# Patient Record
Sex: Female | Born: 1945
Health system: Southern US, Community
[De-identification: ages and names within clinical notes are randomized; demographics above are authoritative.]

## PROBLEM LIST (undated history)

## (undated) DIAGNOSIS — G2581 Restless legs syndrome: Secondary | ICD-10-CM

## (undated) DIAGNOSIS — E785 Hyperlipidemia, unspecified: Secondary | ICD-10-CM

## (undated) DIAGNOSIS — E559 Vitamin D deficiency, unspecified: Secondary | ICD-10-CM

## (undated) DIAGNOSIS — M797 Fibromyalgia: Secondary | ICD-10-CM

## (undated) DIAGNOSIS — F329 Major depressive disorder, single episode, unspecified: Secondary | ICD-10-CM

## (undated) DIAGNOSIS — T8859XA Other complications of anesthesia, initial encounter: Secondary | ICD-10-CM

## (undated) DIAGNOSIS — G43909 Migraine, unspecified, not intractable, without status migrainosus: Secondary | ICD-10-CM

## (undated) DIAGNOSIS — C50919 Malignant neoplasm of unspecified site of unspecified female breast: Secondary | ICD-10-CM

## (undated) HISTORY — DX: Hyperlipidemia, unspecified: E78.5

## (undated) HISTORY — DX: Vitamin D deficiency, unspecified: E55.9

## (undated) HISTORY — PX: REDUCTION MAMMAPLASTY: SUR839

## (undated) HISTORY — DX: Restless legs syndrome: G25.81

## (undated) HISTORY — DX: Major depressive disorder, single episode, unspecified: F32.9

## (undated) HISTORY — DX: Fibromyalgia: M79.7

## (undated) HISTORY — DX: Malignant neoplasm of unspecified site of unspecified female breast: C50.919

---

## 1984-07-07 HISTORY — PX: ABDOMINAL HYSTERECTOMY: SHX81

## 2005-07-07 DIAGNOSIS — R569 Unspecified convulsions: Secondary | ICD-10-CM

## 2005-07-07 HISTORY — DX: Unspecified convulsions: R56.9

## 2007-07-08 HISTORY — PX: EYE SURGERY: SHX253

## 2012-04-21 ENCOUNTER — Ambulatory Visit: Payer: Self-pay | Admitting: Internal Medicine

## 2012-05-05 ENCOUNTER — Ambulatory Visit: Payer: Self-pay | Admitting: Internal Medicine

## 2012-06-01 ENCOUNTER — Ambulatory Visit: Payer: Self-pay | Admitting: Surgery

## 2012-06-01 HISTORY — PX: BREAST BIOPSY: SHX20

## 2012-07-07 HISTORY — PX: THUMB ARTHROSCOPY: SHX2509

## 2012-07-07 HISTORY — PX: CARPAL TUNNEL RELEASE: SHX101

## 2012-07-29 ENCOUNTER — Ambulatory Visit: Payer: Self-pay | Admitting: Specialist

## 2012-07-29 LAB — BASIC METABOLIC PANEL
Anion Gap: 8 (ref 7–16)
BUN: 16 mg/dL (ref 7–18)
Co2: 28 mmol/L (ref 21–32)
Creatinine: 0.93 mg/dL (ref 0.60–1.30)
EGFR (African American): >60
Osmolality: 280 (ref 275–301)
Potassium: 4.1 mmol/L (ref 3.5–5.1)
Sodium: 140 mmol/L (ref 136–145)

## 2012-07-29 LAB — CBC WITH DIFFERENTIAL/PLATELET
Basophil #: 0.1 10*3/uL (ref 0.0–0.1)
Eosinophil #: 0.4 10*3/uL (ref 0.0–0.7)
Eosinophil %: 4.9 %
HGB: 11.6 g/dL — ABNORMAL LOW (ref 12.0–16.0)
Lymphocyte %: 23.8 %
MCH: 28.7 pg (ref 26.0–34.0)
Monocyte #: 0.5 x10 3/mm (ref 0.2–0.9)
Monocyte %: 6.3 %
Neutrophil #: 4.6 10*3/uL (ref 1.4–6.5)
Neutrophil %: 63.8 %
RBC: 4.04 10*6/uL (ref 3.80–5.20)
RDW: 14.8 % — ABNORMAL HIGH (ref 11.5–14.5)

## 2012-08-06 ENCOUNTER — Ambulatory Visit: Payer: Self-pay | Admitting: Specialist

## 2012-11-25 LAB — LIPID PANEL
CHOLESTEROL: 256 mg/dL — AB (ref 0–200)
HDL: 99 mg/dL — AB (ref 35–70)
LDL Cholesterol: 117 mg/dL
TRIGLYCERIDES: 200 mg/dL — AB (ref 40–160)

## 2013-05-26 ENCOUNTER — Ambulatory Visit: Payer: Self-pay | Admitting: Internal Medicine

## 2013-12-02 ENCOUNTER — Ambulatory Visit: Payer: Self-pay | Admitting: Internal Medicine

## 2013-12-02 LAB — HM MAMMOGRAPHY: HM MAMMO: NORMAL

## 2014-07-07 DIAGNOSIS — C50919 Malignant neoplasm of unspecified site of unspecified female breast: Secondary | ICD-10-CM

## 2014-07-07 HISTORY — PX: MASTECTOMY, RADICAL: SHX710

## 2014-07-07 HISTORY — DX: Malignant neoplasm of unspecified site of unspecified female breast: C50.919

## 2014-10-11 ENCOUNTER — Encounter: Payer: Self-pay | Admitting: Internal Medicine

## 2014-10-11 DIAGNOSIS — G43009 Migraine without aura, not intractable, without status migrainosus: Secondary | ICD-10-CM | POA: Insufficient documentation

## 2014-10-11 DIAGNOSIS — M722 Plantar fascial fibromatosis: Secondary | ICD-10-CM | POA: Insufficient documentation

## 2014-10-11 DIAGNOSIS — E559 Vitamin D deficiency, unspecified: Secondary | ICD-10-CM | POA: Insufficient documentation

## 2014-10-11 DIAGNOSIS — R600 Localized edema: Secondary | ICD-10-CM | POA: Insufficient documentation

## 2014-10-11 DIAGNOSIS — F419 Anxiety disorder, unspecified: Secondary | ICD-10-CM | POA: Insufficient documentation

## 2014-10-11 DIAGNOSIS — L409 Psoriasis, unspecified: Secondary | ICD-10-CM | POA: Insufficient documentation

## 2014-10-11 DIAGNOSIS — G2581 Restless legs syndrome: Secondary | ICD-10-CM | POA: Insufficient documentation

## 2014-10-11 DIAGNOSIS — F5101 Primary insomnia: Secondary | ICD-10-CM | POA: Insufficient documentation

## 2014-10-11 DIAGNOSIS — F3181 Bipolar II disorder: Secondary | ICD-10-CM | POA: Insufficient documentation

## 2014-10-11 DIAGNOSIS — F324 Major depressive disorder, single episode, in partial remission: Secondary | ICD-10-CM | POA: Insufficient documentation

## 2014-10-11 DIAGNOSIS — N6019 Diffuse cystic mastopathy of unspecified breast: Secondary | ICD-10-CM | POA: Insufficient documentation

## 2014-10-11 DIAGNOSIS — N951 Menopausal and female climacteric states: Secondary | ICD-10-CM | POA: Insufficient documentation

## 2014-10-11 LAB — HM COLONOSCOPY

## 2014-10-20 ENCOUNTER — Other Ambulatory Visit: Payer: Self-pay | Admitting: Internal Medicine

## 2014-10-20 DIAGNOSIS — Z1231 Encounter for screening mammogram for malignant neoplasm of breast: Secondary | ICD-10-CM

## 2014-12-05 ENCOUNTER — Ambulatory Visit
Admission: RE | Admit: 2014-12-05 | Discharge: 2014-12-05 | Disposition: A | Discharge status: Home / Self Care | Payer: Medicare PPO | Source: Ambulatory Visit | Attending: Internal Medicine | Admitting: Internal Medicine

## 2014-12-05 DIAGNOSIS — R921 Mammographic calcification found on diagnostic imaging of breast: Secondary | ICD-10-CM | POA: Insufficient documentation

## 2014-12-05 DIAGNOSIS — R922 Inconclusive mammogram: Secondary | ICD-10-CM | POA: Insufficient documentation

## 2014-12-05 DIAGNOSIS — Z1231 Encounter for screening mammogram for malignant neoplasm of breast: Secondary | ICD-10-CM | POA: Diagnosis not present

## 2014-12-06 ENCOUNTER — Other Ambulatory Visit: Payer: Self-pay | Admitting: Internal Medicine

## 2014-12-06 ENCOUNTER — Ambulatory Visit (INDEPENDENT_AMBULATORY_CARE_PROVIDER_SITE_OTHER): Payer: Medicare PPO | Admitting: Internal Medicine

## 2014-12-06 ENCOUNTER — Encounter: Payer: Self-pay | Admitting: Internal Medicine

## 2014-12-06 VITALS — BP 118/60 | HR 60 | Ht 62.0 in | Wt 171.0 lb

## 2014-12-06 DIAGNOSIS — N289 Disorder of kidney and ureter, unspecified: Secondary | ICD-10-CM

## 2014-12-06 DIAGNOSIS — R928 Other abnormal and inconclusive findings on diagnostic imaging of breast: Secondary | ICD-10-CM

## 2014-12-06 DIAGNOSIS — G43009 Migraine without aura, not intractable, without status migrainosus: Secondary | ICD-10-CM | POA: Diagnosis not present

## 2014-12-06 DIAGNOSIS — M797 Fibromyalgia: Secondary | ICD-10-CM | POA: Diagnosis not present

## 2014-12-06 DIAGNOSIS — F5101 Primary insomnia: Secondary | ICD-10-CM | POA: Diagnosis not present

## 2014-12-06 DIAGNOSIS — Z1211 Encounter for screening for malignant neoplasm of colon: Secondary | ICD-10-CM | POA: Diagnosis not present

## 2014-12-06 DIAGNOSIS — N6019 Diffuse cystic mastopathy of unspecified breast: Secondary | ICD-10-CM | POA: Diagnosis not present

## 2014-12-06 NOTE — Progress Notes (Signed)
Date:  12/06/2014   Name:  Sandra Mckinney   DOB:  1946-02-24   MRN:  [id number]  PCP:  Halina Maidens, MD    Chief Complaint: Depression and Migraine   History of Present Illness:  This is a 69 y.o. female  who is presenting with migraine headaches.  They are stable - often related to sinus or allergies.  In the past had one or two per week, now only about once a month. Fibromyalgia is also stable.  Still able to babysit 2 small grandchildren on a daily basis.  Using hydrocodone three times per day and not needing to take more.  She does not need to take it for sleep.  Mostly during the day with back pain. She also has history of fibrocystic disease.  Her mammogram yesterday showed left calcifications that need additional views. She is also no longer taking ambien due to short term memory issues.  Not sleeping as well - gets 5 hours per night.  She sometimes takes a half [place] if she needs to. At last visit in September, her creatnine was 1.8 with GFR 48.  She was told to have a recheck. Review of Systems:  Review of Systems  Constitutional: Positive for fatigue. Negative for activity change.  Respiratory: Negative for cough and shortness of breath.   Cardiovascular: Negative for leg swelling.  Musculoskeletal: Positive for myalgias, back pain and arthralgias. Negative for joint swelling and gait problem.  Neurological: Positive for headaches. Negative for syncope, speech difficulty and numbness.  Psychiatric/Behavioral: Positive for sleep disturbance. Negative for decreased concentration. The patient is not nervous/anxious.     Patient Active Problem List   Diagnosis Date Noted  . Anxiety disorder 10/11/2014  . Bloodgood disease 10/11/2014  . Plantar fasciitis 10/11/2014  . Fibrositis 10/11/2014  . Local edema 10/11/2014  . Depression, major, single episode, in partial remission 10/11/2014  . Hot flash, menopausal 10/11/2014  . Migraine without aura and responsive to  treatment 10/11/2014  . Idiopathic insomnia 10/11/2014  . Psoriasis 10/11/2014  . Restless leg 10/11/2014  . Avitaminosis D 10/11/2014    Prior to Admission medications   Medication Sig Start Date End Date Taking? Authorizing Provider  aspirin-acetaminophen-caffeine (EXCEDRIN MIGRAINE) 6045891421 MG per tablet Take 1 tablet by mouth as needed.   Yes Historical Provider, MD  betamethasone dipropionate (DIPROLENE) 0.05 % cream Apply 1 application topically 2 (two) times daily.   Yes Historical Provider, MD  busPIRone (BUSPAR) 10 MG tablet Take 1 tablet by mouth 4 (four) times daily. 03/29/14  Yes Historical Provider, MD  Calcium-Magnesium-Vitamin D 600-40-500 MG-MG-UNIT TB24 Take 1 tablet by mouth daily.   Yes Historical Provider, MD  cholecalciferol (VITAMIN D) 1000 UNITS tablet Take 1 tablet by mouth daily.   Yes Historical Provider, MD  estrogens, conjugated, (PREMARIN) 0.625 MG tablet Take 1 tablet by mouth daily. 10/10/14  Yes Historical Provider, MD  furosemide (LASIX) 20 MG tablet Take 1 tablet by mouth daily. 10/10/14  Yes Historical Provider, MD  HYDROcodone-acetaminophen (NORCO/VICODIN) 5-325 MG per tablet Take 1 tablet by mouth 3 (three) times daily. 10/02/14  Yes Historical Provider, MD  magnesium oxide (MAG-OX) 400 MG tablet Take 1 tablet by mouth daily.   Yes Historical Provider, MD  metaxalone (SKELAXIN) 800 MG tablet Take 1 tablet by mouth every 8 (eight) hours. 11/11/13  Yes Historical Provider, MD  MULTIPLE VITAMINS-MINERALS PO Take 1 tablet by mouth daily.   Yes Historical Provider, MD  Omega 3 1200 MG CAPS Take 1  capsule by mouth daily.   Yes Historical Provider, MD  propranolol ER (INDERAL LA) 60 MG 24 hr capsule Take 1 capsule by mouth daily. 08/31/14  Yes Historical Provider, MD  rOPINIRole (REQUIP) 0.5 MG tablet Take 5 tablets by mouth at bedtime. 06/06/14  Yes Historical Provider, MD    Allergies  Allergen Reactions  . Cephalosporins   . Duloxetine Hcl   . Lyrica  [Pregabalin]   . Oxycodone   . Penicillins   . Prilosec  [Omeprazole]   . Sulfa Antibiotics     Past Surgical History  Procedure Laterality Date  . Carpal tunnel release Right 2014  . Breast surgery Bilateral breast reduction    2000  . Abdominal hysterectomy  1986    uterine fibroids and endometriosis  . Eye surgery Bilateral 2009    catarcts  . Thumb arthroscopy Right 2014  . Breast biopsy Right 06/01/12    [place] bx/clip-neg  . Reduction mammaplasty      2001    History  Substance Use Topics  . Smoking status: Never Smoker   . Smokeless tobacco: Not on file  . Alcohol Use: No    Family History  Problem Relation Age of Onset  . Dementia Mother   . Hypertension Mother   . Heart disease Father 24    Medication list has been reviewed and updated.  Physical Examination:  Physical Exam  Constitutional: She is oriented to person, place, and time. She appears well-developed and well-nourished.  HENT:  Head: Normocephalic.  Neck: No tracheal deviation present. No thyromegaly present.  Cardiovascular: Normal rate, regular rhythm and normal heart sounds.   Pulmonary/Chest: No respiratory distress. She has no wheezes. She has no rales.  Musculoskeletal: Normal range of motion. She exhibits no edema.  Lymphadenopathy:    She has no cervical adenopathy.  Neurological: She is alert and oriented to person, place, and time.  Skin: Skin is warm.  Psychiatric: She has a normal mood and affect. Her behavior is normal.    BP 118/60 mmHg  Pulse 60  Ht 5\' 2"  (1.575 m)  Wt 171 lb (77.565 kg)  BMI 31.27 kg/m2  Assessment and Plan: 1. Migraine without aura and responsive to treatment Doing well.  Controlled - continue current therapy.  2. Fibrositis Stable.  3. Bloodgood disease, unspecified laterality Will need repeat views due to recent left sided calcifications.  4. Idiopathic insomnia May continue [place] as needed.  She still some left over and will use 1/2 tab  intermittently.  5. Colon cancer screening Unable to provide stool cards today.  Will send next visit.  6. Renal insufficiency Last GFR 48.  Continue avoid nsaids. - Basic Metabolic Panel (BMET)   Halina Maidens, MD [facility]  12/06/2014

## 2014-12-07 LAB — BASIC METABOLIC PANEL
BUN / CREAT RATIO: 15 (ref 11–26)
BUN: 15 mg/dL (ref 8–27)
CHLORIDE: 98 mmol/L (ref 97–108)
CO2: 27 mmol/L (ref 18–29)
Calcium: 9.4 mg/dL (ref 8.7–10.3)
Creatinine, Ser: 1 mg/dL (ref 0.57–1.00)
GFR calc Af Amer: 68 mL/min/{1.73_m2} (ref >59)
GFR, EST NON AFRICAN AMERICAN: 59 mL/min/{1.73_m2} — AB (ref >59)
Glucose: 88 mg/dL (ref 65–99)
POTASSIUM: 4.6 mmol/L (ref 3.5–5.2)
SODIUM: 139 mmol/L (ref 134–144)

## 2014-12-11 ENCOUNTER — Ambulatory Visit

## 2014-12-11 ENCOUNTER — Ambulatory Visit
Admission: RE | Admit: 2014-12-11 | Discharge: 2014-12-11 | Disposition: A | Discharge status: Home / Self Care | Payer: Medicare PPO | Source: Ambulatory Visit | Attending: Internal Medicine | Admitting: Internal Medicine

## 2014-12-11 ENCOUNTER — Other Ambulatory Visit: Payer: Self-pay | Admitting: Internal Medicine

## 2014-12-11 DIAGNOSIS — R922 Inconclusive mammogram: Secondary | ICD-10-CM | POA: Diagnosis not present

## 2014-12-11 DIAGNOSIS — R920 Mammographic microcalcification found on diagnostic imaging of breast: Secondary | ICD-10-CM | POA: Diagnosis not present

## 2014-12-11 DIAGNOSIS — R928 Other abnormal and inconclusive findings on diagnostic imaging of breast: Secondary | ICD-10-CM

## 2014-12-12 ENCOUNTER — Other Ambulatory Visit: Payer: Self-pay | Admitting: Internal Medicine

## 2014-12-12 DIAGNOSIS — R928 Other abnormal and inconclusive findings on diagnostic imaging of breast: Secondary | ICD-10-CM

## 2014-12-12 DIAGNOSIS — R921 Mammographic calcification found on diagnostic imaging of breast: Secondary | ICD-10-CM

## 2014-12-13 DIAGNOSIS — D485 Neoplasm of uncertain behavior of skin: Secondary | ICD-10-CM | POA: Diagnosis not present

## 2014-12-21 ENCOUNTER — Ambulatory Visit
Admission: RE | Admit: 2014-12-21 | Discharge: 2014-12-21 | Disposition: A | Discharge status: Home / Self Care | Payer: Medicare PPO | Source: Ambulatory Visit | Attending: Internal Medicine | Admitting: Internal Medicine

## 2014-12-21 ENCOUNTER — Other Ambulatory Visit: Payer: Self-pay | Admitting: Internal Medicine

## 2014-12-21 DIAGNOSIS — D0512 Intraductal carcinoma in situ of left breast: Secondary | ICD-10-CM | POA: Insufficient documentation

## 2014-12-21 DIAGNOSIS — R921 Mammographic calcification found on diagnostic imaging of breast: Secondary | ICD-10-CM

## 2014-12-21 DIAGNOSIS — R928 Other abnormal and inconclusive findings on diagnostic imaging of breast: Secondary | ICD-10-CM

## 2014-12-21 DIAGNOSIS — C50912 Malignant neoplasm of unspecified site of left female breast: Secondary | ICD-10-CM

## 2014-12-25 ENCOUNTER — Other Ambulatory Visit: Payer: Self-pay

## 2014-12-25 ENCOUNTER — Telehealth: Payer: Self-pay | Admitting: Internal Medicine

## 2014-12-25 DIAGNOSIS — D0512 Intraductal carcinoma in situ of left breast: Secondary | ICD-10-CM

## 2014-12-25 LAB — SURGICAL PATHOLOGY

## 2014-12-25 MED ORDER — HYDROCODONE-ACETAMINOPHEN 5-325 MG PO TABS: 1.0000 | ORAL_TABLET | Freq: Three times a day (TID) | ORAL | Status: DC

## 2014-12-25 NOTE — Telephone Encounter (Signed)
Findings discussed with patient by phone.  She will await the call for her appointment with Dr. Marina Gravel.

## 2014-12-26 ENCOUNTER — Ambulatory Visit: Payer: Self-pay | Admitting: Surgery

## 2014-12-26 ENCOUNTER — Encounter: Payer: Self-pay | Admitting: *Deleted

## 2014-12-26 ENCOUNTER — Telehealth: Payer: Self-pay

## 2014-12-26 NOTE — Progress Notes (Signed)
Oncology Nurse Navigator Documentation  Oncology Nurse Navigator Flowsheets 12/26/2014  Referral date to RadOnc/MedOnc 12/26/2014  Navigator Encounter Type Introductory phone call  Patient Visit Type Initial  Barriers/Navigation Needs No barriers at this time  Time Spent with Patient 30   The patient called the breast center today with complaints of left breast pain and swelling.  She was transferred to me since she received the results of her biopsy from 12/21/14.  Patient states her left breast is swollen and very painful to touch.  Rates the pain an 8 on a 0/10 scale.  States I also take Oxycodone all the time.  Informed patient that I would assist in getting her seen by Dr. Marina Gravel as soon as possible since he is who her primary care provider, Dr. Army Melia is referring her to for her new diagnosis of breast cancer.  She is agreeable.  Appointment scheduled to see him tomorrow at 9:30. While notifying the patient of her appointment time, she said she was seeing Dr. Marina Gravel today at 1:45.  Reviewed navigation services.  Will leave patient her breast cancer educational literature, "My Breast Cancer Treatment Handbook" by Josephine Igo, RN at Dr. Juliette Mangle office today for her to pick up.

## 2014-12-26 NOTE — Telephone Encounter (Signed)
Spoke with Sheena at this time. She explains that patient has had positive Left Breast Biopsy for DCIS on 6/16. Pt placed on schedule to been by Dr. Marina Gravel tomorrow am at 0930.

## 2014-12-27 ENCOUNTER — Ambulatory Visit (INDEPENDENT_AMBULATORY_CARE_PROVIDER_SITE_OTHER): Payer: Medicare PPO | Admitting: Surgery

## 2014-12-27 ENCOUNTER — Encounter: Payer: Self-pay | Admitting: Surgery

## 2014-12-27 ENCOUNTER — Telehealth: Payer: Self-pay

## 2014-12-27 ENCOUNTER — Telehealth: Payer: Self-pay | Admitting: *Deleted

## 2014-12-27 VITALS — BP 132/77 | HR 56 | Temp 97.7°F | Ht 62.0 in | Wt 168.6 lb

## 2014-12-27 DIAGNOSIS — D0511 Intraductal carcinoma in situ of right breast: Secondary | ICD-10-CM | POA: Insufficient documentation

## 2014-12-27 DIAGNOSIS — D0512 Intraductal carcinoma in situ of left breast: Secondary | ICD-10-CM | POA: Diagnosis not present

## 2014-12-27 NOTE — Telephone Encounter (Signed)
Spoke with Nurse Navigator at this time. She has scheduled patient to be seen Monday with Dr. Mike Gip at 3:30pm.  She will notify patient.  I will place referral.

## 2014-12-27 NOTE — Telephone Encounter (Signed)
Received message from [employer] at Dr. Algernon Huxley office to schedule patient to see Dr. Mike Gip prior to surgical decisions.  Called patient to give her an appointment date.  Patient and her husband were very upset and refused appointment to see a medical oncologist.  States she wants a mastectomy, no radiation, and feels like her opinion was not addressed.  States she is going to get an appointment at [facility] where her son works.  States she was disappointed in her care.  Number given to the office of patient experience.  Offered support.  Gave her my number to call if I could assist them in any way.

## 2014-12-27 NOTE — Progress Notes (Signed)
Patient ID: Sandra Mckinney, female   DOB: [birth year], 69 y.o.   MRN: [id number]  Chief Complaint  Patient presents with  . Breast Cancer    Left Breast - Positive DCIS (6/16)- [place]    HPI    Sandra Mckinney is a 69 y.o. female.  Referred by her primary care physician Dr. Army Melia with a newly diagnosed left breast ductal carcinoma in situ. Mammography review demonstrates the tumor to be at least 1.2 centimeter in size. Coarse calcifications were seen which prompted a stereotactic core biopsy performed on June 16. Of note the patient is had a prior abdominal hysterectomy with bilateral breast reconstruction and reduction 2000. She had a right breast cyst aspirated approximately 3 years ago which was benign in nature.  The patient and her husband had many questions. They're also interested in a second opinion.  2 days ago while lifting her grandchild she had sudden onset of left breast pain which has persisted.  Past Medical History  Diagnosis Date  . Breast cancer     Past Surgical History  Procedure Laterality Date  . Carpal tunnel release Right 2014  . Breast surgery Bilateral breast reduction    2000  . Abdominal hysterectomy  1986    uterine fibroids and endometriosis  . Eye surgery Bilateral 2009    catarcts  . Thumb arthroscopy Right 2014  . Breast biopsy Right 06/01/12    [place] bx/clip-neg  . Reduction mammaplasty      2001    Family History  Problem Relation Age of Onset  . Dementia Mother   . Hypertension Mother   . Heart disease Father 45    Social History History  Substance Use Topics  . Smoking status: Never Smoker   . Smokeless tobacco: Never Used  . Alcohol Use: No    Allergies  Allergen Reactions  . Cephalosporins   . Duloxetine Hcl   . Lyrica [Pregabalin]   . Oxycodone   . Penicillins   . Prilosec  [Omeprazole]   . Sulfa Antibiotics     Current Outpatient Prescriptions  Medication Sig Dispense Refill  . betamethasone  dipropionate (DIPROLENE) 0.05 % cream Apply 1 application topically 2 (two) times daily.    . busPIRone (BUSPAR) 10 MG tablet Take 1 tablet by mouth 4 (four) times daily.    . Calcium-Magnesium-Vitamin D 892-11-941 MG-MG-UNIT TB24 Take 1 tablet by mouth daily.    . cholecalciferol (VITAMIN D) 1000 UNITS tablet Take 1 tablet by mouth daily.    Marland Kitchen estrogens, conjugated, (PREMARIN) 0.625 MG tablet Take 1 tablet by mouth daily.    . furosemide (LASIX) 20 MG tablet Take 1 tablet by mouth daily.    Marland Kitchen HYDROcodone-acetaminophen (NORCO/VICODIN) 5-325 MG per tablet Take 1 tablet by mouth 3 (three) times daily. 90 tablet 0  . metaxalone (SKELAXIN) 800 MG tablet Take 1 tablet by mouth every 8 (eight) hours.    . MULTIPLE VITAMINS-MINERALS PO Take 1 tablet by mouth daily.    . propranolol ER (INDERAL LA) 60 MG 24 hr capsule Take 1 capsule by mouth daily.    Marland Kitchen zolpidem (AMBIEN) 5 MG tablet Take 5 mg by mouth at bedtime as needed for sleep.     No current facility-administered medications for this visit.      Review of Systems A 10 point review of systems was asked and was negative except for the following positive findings  out left breast pain. This started 2 days ago. There has been no nipple  drainage no fever. No weight loss.  Blood pressure 132/77, pulse 56, temperature 97.7 F (36.5 C), temperature source Oral, height 5\' 2"  (1.575 m), weight 168 lb 9.6 oz (76.476 kg).  Physical Exam CONSTITUTIONAL:  Pleasant, well-developed, well-nourished, and in no acute distress. EYES: Pupils equal and reactive to light, Sclera non-icteric EARS, NOSE, MOUTH AND THROAT:  The oropharynx was clear.  Dentition is good repair.  Oral mucosa pink and moist. LYMPH NODES:  Lymph nodes in the neck and axillae were normal RESPIRATORY:  Lungs were clear.  Normal respiratory effort without pathologic use of accessory muscles of respiration CARDIOVASCULAR: Heart was regular without murmurs.  There were no carotid  bruits. GI: The abdomen was soft, nontender, and nondistended. There were no palpable masses. There was no hepatosplenomegaly. There were normal bowel sounds in all quadrants. GU:  Rectal deferred.   MUSCULOSKELETAL:  Normal muscle strength and tone.  No clubbing or cyanosis.   SKIN:  There were no pathologic skin lesions.  There were no nodules on palpation. NEUROLOGIC:  Sensation is normal.  Cranial nerves are grossly intact. PSYCH:  Oriented to person, place and time.  Mood and affect are normal.  Breast examination with the patient's husband present demonstrates them to be symmetrical. There are evidence of prior breast reduction with inframammary incision sites. I can appreciate no evidence of hematoma or left breast mass palpable. There is no nipple abnormalities and no nipple discharge.  Data Reviewed Mammography was reviewed dated June 2016 on the PACS monitor. Indeed there are coarse calcifications which are concerning in the area that was biopsied in the left breast. The right breast is unremarkable.  I have personally reviewed the patient's imaging, laboratory findings and medical records.    Assessment    Left breast ductal carcinoma in situ.  Breast pain unclear etiology as I see no evidence of hematoma related to the biopsy.    Plan      She voiced a strong desire to avoid any type of radiation. Discussed surgical options including total mastectomy versus breast conservation therapy with lumpectomy followed by whole breast radiation. I also discussed with her the possibility of needing a sentinel lymph node a simple mastectomy is contemplated. I recommended to her and her husband which they agreed with referral to the cancer center as well as Dr. Donella Stade of radiation oncology prior to any final decision making. Her case will be discussed with her weekly breast tumor board and I will see her back in the office following this. Again she may require a second opinion based on her  anxiety level and her that if she would like to have surgery here we can see her back in the office following consultation and oncology.   Time spent with patient was 60 minutes, with more than 50% of the time spent counseling and coordinating care of patient.     Sherri Rad 12/27/2014, 1:50 PM

## 2014-12-27 NOTE — Addendum Note (Signed)
Addended by: Sherri Rad on: 12/27/2014 02:16 PM   Modules accepted: Orders

## 2014-12-27 NOTE — Telephone Encounter (Signed)
Patient was seen in our office this am and will need an appointment with Dr. Mike Gip as soon as possible. She would like to discuss Oncology options and then make her decision regarding surgery.

## 2014-12-27 NOTE — Patient Instructions (Signed)
You will need an appointment with Dr. Mike Gip as soon as possible. We will contact the Breast Navigator and she will call you with your appointment.  Please call our office if you would like to schedule a follow-up appointment after discussing this situation with Dr. Mike Gip.

## 2014-12-29 DIAGNOSIS — Z1231 Encounter for screening mammogram for malignant neoplasm of breast: Secondary | ICD-10-CM | POA: Diagnosis not present

## 2015-01-01 ENCOUNTER — Ambulatory Visit: Admitting: Hematology and Oncology

## 2015-01-01 DIAGNOSIS — D0512 Intraductal carcinoma in situ of left breast: Secondary | ICD-10-CM | POA: Diagnosis not present

## 2015-01-03 DIAGNOSIS — Z87891 Personal history of nicotine dependence: Secondary | ICD-10-CM | POA: Diagnosis not present

## 2015-01-03 DIAGNOSIS — Z853 Personal history of malignant neoplasm of breast: Secondary | ICD-10-CM | POA: Insufficient documentation

## 2015-01-03 DIAGNOSIS — D0512 Intraductal carcinoma in situ of left breast: Secondary | ICD-10-CM | POA: Diagnosis not present

## 2015-01-03 DIAGNOSIS — D0510 Intraductal carcinoma in situ of unspecified breast: Secondary | ICD-10-CM | POA: Diagnosis not present

## 2015-01-03 DIAGNOSIS — Z79899 Other long term (current) drug therapy: Secondary | ICD-10-CM | POA: Diagnosis not present

## 2015-01-03 DIAGNOSIS — C50912 Malignant neoplasm of unspecified site of left female breast: Secondary | ICD-10-CM | POA: Diagnosis not present

## 2015-01-03 DIAGNOSIS — C50419 Malignant neoplasm of upper-outer quadrant of unspecified female breast: Secondary | ICD-10-CM | POA: Insufficient documentation

## 2015-01-04 DIAGNOSIS — D0512 Intraductal carcinoma in situ of left breast: Secondary | ICD-10-CM | POA: Insufficient documentation

## 2015-01-09 DIAGNOSIS — N644 Mastodynia: Secondary | ICD-10-CM | POA: Diagnosis not present

## 2015-01-09 DIAGNOSIS — D015 Carcinoma in situ of liver, gallbladder and bile ducts: Secondary | ICD-10-CM | POA: Diagnosis not present

## 2015-01-09 DIAGNOSIS — F319 Bipolar disorder, unspecified: Secondary | ICD-10-CM | POA: Diagnosis not present

## 2015-01-09 DIAGNOSIS — R590 Localized enlarged lymph nodes: Secondary | ICD-10-CM | POA: Diagnosis not present

## 2015-01-09 DIAGNOSIS — N62 Hypertrophy of breast: Secondary | ICD-10-CM | POA: Diagnosis not present

## 2015-01-09 DIAGNOSIS — D0512 Intraductal carcinoma in situ of left breast: Secondary | ICD-10-CM | POA: Diagnosis not present

## 2015-01-09 DIAGNOSIS — N6481 Ptosis of breast: Secondary | ICD-10-CM | POA: Diagnosis not present

## 2015-01-09 DIAGNOSIS — Z9012 Acquired absence of left breast and nipple: Secondary | ICD-10-CM | POA: Diagnosis not present

## 2015-01-09 DIAGNOSIS — C50912 Malignant neoplasm of unspecified site of left female breast: Secondary | ICD-10-CM | POA: Diagnosis not present

## 2015-01-09 DIAGNOSIS — G43909 Migraine, unspecified, not intractable, without status migrainosus: Secondary | ICD-10-CM | POA: Diagnosis not present

## 2015-01-09 DIAGNOSIS — C50911 Malignant neoplasm of unspecified site of right female breast: Secondary | ICD-10-CM | POA: Diagnosis not present

## 2015-01-09 DIAGNOSIS — Z87891 Personal history of nicotine dependence: Secondary | ICD-10-CM | POA: Diagnosis not present

## 2015-01-10 ENCOUNTER — Other Ambulatory Visit: Payer: Self-pay | Admitting: Internal Medicine

## 2015-01-10 DIAGNOSIS — D0512 Intraductal carcinoma in situ of left breast: Secondary | ICD-10-CM | POA: Diagnosis not present

## 2015-01-10 DIAGNOSIS — N6481 Ptosis of breast: Secondary | ICD-10-CM | POA: Diagnosis not present

## 2015-01-10 DIAGNOSIS — F319 Bipolar disorder, unspecified: Secondary | ICD-10-CM | POA: Diagnosis not present

## 2015-01-10 DIAGNOSIS — Z87891 Personal history of nicotine dependence: Secondary | ICD-10-CM | POA: Diagnosis not present

## 2015-01-10 DIAGNOSIS — G43909 Migraine, unspecified, not intractable, without status migrainosus: Secondary | ICD-10-CM | POA: Diagnosis not present

## 2015-01-10 DIAGNOSIS — N644 Mastodynia: Secondary | ICD-10-CM | POA: Diagnosis not present

## 2015-01-22 DIAGNOSIS — D0511 Intraductal carcinoma in situ of right breast: Secondary | ICD-10-CM | POA: Diagnosis not present

## 2015-01-22 DIAGNOSIS — C50412 Malignant neoplasm of upper-outer quadrant of left female breast: Secondary | ICD-10-CM | POA: Diagnosis not present

## 2015-01-22 DIAGNOSIS — D0512 Intraductal carcinoma in situ of left breast: Secondary | ICD-10-CM | POA: Diagnosis not present

## 2015-01-29 ENCOUNTER — Other Ambulatory Visit: Payer: Self-pay

## 2015-01-29 MED ORDER — HYDROCODONE-ACETAMINOPHEN 5-325 MG PO TABS: 1.0000 | ORAL_TABLET | Freq: Three times a day (TID) | ORAL | Status: DC

## 2015-02-05 DIAGNOSIS — D0511 Intraductal carcinoma in situ of right breast: Secondary | ICD-10-CM | POA: Diagnosis not present

## 2015-02-06 DIAGNOSIS — C50812 Malignant neoplasm of overlapping sites of left female breast: Secondary | ICD-10-CM | POA: Diagnosis not present

## 2015-02-06 DIAGNOSIS — Z170 Estrogen receptor positive status [ER+]: Secondary | ICD-10-CM | POA: Diagnosis not present

## 2015-02-06 DIAGNOSIS — C779 Secondary and unspecified malignant neoplasm of lymph node, unspecified: Secondary | ICD-10-CM | POA: Diagnosis not present

## 2015-02-06 DIAGNOSIS — C50919 Malignant neoplasm of unspecified site of unspecified female breast: Secondary | ICD-10-CM | POA: Diagnosis not present

## 2015-02-06 DIAGNOSIS — Z9011 Acquired absence of right breast and nipple: Secondary | ICD-10-CM | POA: Diagnosis not present

## 2015-02-06 DIAGNOSIS — D0511 Intraductal carcinoma in situ of right breast: Secondary | ICD-10-CM | POA: Diagnosis not present

## 2015-02-06 DIAGNOSIS — Z880 Allergy status to penicillin: Secondary | ICD-10-CM | POA: Diagnosis not present

## 2015-02-06 DIAGNOSIS — Z882 Allergy status to sulfonamides status: Secondary | ICD-10-CM | POA: Diagnosis not present

## 2015-02-06 DIAGNOSIS — Z87891 Personal history of nicotine dependence: Secondary | ICD-10-CM | POA: Diagnosis not present

## 2015-02-06 DIAGNOSIS — G43909 Migraine, unspecified, not intractable, without status migrainosus: Secondary | ICD-10-CM | POA: Diagnosis not present

## 2015-02-07 DIAGNOSIS — C779 Secondary and unspecified malignant neoplasm of lymph node, unspecified: Secondary | ICD-10-CM | POA: Diagnosis not present

## 2015-02-07 DIAGNOSIS — Z87891 Personal history of nicotine dependence: Secondary | ICD-10-CM | POA: Diagnosis not present

## 2015-02-07 DIAGNOSIS — Z170 Estrogen receptor positive status [ER+]: Secondary | ICD-10-CM | POA: Diagnosis not present

## 2015-02-07 DIAGNOSIS — G43909 Migraine, unspecified, not intractable, without status migrainosus: Secondary | ICD-10-CM | POA: Diagnosis not present

## 2015-02-07 DIAGNOSIS — D0511 Intraductal carcinoma in situ of right breast: Secondary | ICD-10-CM | POA: Diagnosis not present

## 2015-02-07 DIAGNOSIS — C50812 Malignant neoplasm of overlapping sites of left female breast: Secondary | ICD-10-CM | POA: Diagnosis not present

## 2015-02-07 DIAGNOSIS — Z880 Allergy status to penicillin: Secondary | ICD-10-CM | POA: Diagnosis not present

## 2015-02-07 DIAGNOSIS — Z882 Allergy status to sulfonamides status: Secondary | ICD-10-CM | POA: Diagnosis not present

## 2015-02-19 DIAGNOSIS — D0511 Intraductal carcinoma in situ of right breast: Secondary | ICD-10-CM | POA: Diagnosis not present

## 2015-02-19 DIAGNOSIS — C50412 Malignant neoplasm of upper-outer quadrant of left female breast: Secondary | ICD-10-CM | POA: Diagnosis not present

## 2015-03-02 DIAGNOSIS — I10 Essential (primary) hypertension: Secondary | ICD-10-CM | POA: Diagnosis not present

## 2015-03-02 DIAGNOSIS — D0511 Intraductal carcinoma in situ of right breast: Secondary | ICD-10-CM | POA: Diagnosis not present

## 2015-03-02 DIAGNOSIS — C50412 Malignant neoplasm of upper-outer quadrant of left female breast: Secondary | ICD-10-CM | POA: Diagnosis not present

## 2015-03-09 ENCOUNTER — Other Ambulatory Visit: Payer: Self-pay

## 2015-03-09 MED ORDER — HYDROCODONE-ACETAMINOPHEN 5-325 MG PO TABS: 1.0000 | ORAL_TABLET | Freq: Three times a day (TID) | ORAL | Status: DC

## 2015-03-19 ENCOUNTER — Other Ambulatory Visit: Payer: Self-pay | Admitting: Internal Medicine

## 2015-04-02 DIAGNOSIS — Z9012 Acquired absence of left breast and nipple: Secondary | ICD-10-CM | POA: Diagnosis not present

## 2015-04-02 DIAGNOSIS — D0511 Intraductal carcinoma in situ of right breast: Secondary | ICD-10-CM | POA: Diagnosis not present

## 2015-04-02 DIAGNOSIS — Z90710 Acquired absence of both cervix and uterus: Secondary | ICD-10-CM | POA: Diagnosis not present

## 2015-04-02 DIAGNOSIS — C50919 Malignant neoplasm of unspecified site of unspecified female breast: Secondary | ICD-10-CM | POA: Diagnosis not present

## 2015-04-02 DIAGNOSIS — N951 Menopausal and female climacteric states: Secondary | ICD-10-CM | POA: Diagnosis not present

## 2015-04-02 DIAGNOSIS — C50412 Malignant neoplasm of upper-outer quadrant of left female breast: Secondary | ICD-10-CM | POA: Diagnosis not present

## 2015-04-02 DIAGNOSIS — Z9011 Acquired absence of right breast and nipple: Secondary | ICD-10-CM | POA: Diagnosis not present

## 2015-04-02 DIAGNOSIS — E2839 Other primary ovarian failure: Secondary | ICD-10-CM | POA: Diagnosis not present

## 2015-04-06 ENCOUNTER — Ambulatory Visit (INDEPENDENT_AMBULATORY_CARE_PROVIDER_SITE_OTHER): Payer: Medicare PPO | Admitting: Internal Medicine

## 2015-04-06 ENCOUNTER — Other Ambulatory Visit: Payer: Self-pay | Admitting: Internal Medicine

## 2015-04-06 ENCOUNTER — Encounter: Payer: Self-pay | Admitting: Internal Medicine

## 2015-04-06 VITALS — HR 64 | Temp 97.6°F | Ht 62.0 in | Wt 167.0 lb

## 2015-04-06 DIAGNOSIS — J019 Acute sinusitis, unspecified: Secondary | ICD-10-CM | POA: Diagnosis not present

## 2015-04-06 DIAGNOSIS — D0512 Intraductal carcinoma in situ of left breast: Secondary | ICD-10-CM

## 2015-04-06 DIAGNOSIS — D0511 Intraductal carcinoma in situ of right breast: Secondary | ICD-10-CM | POA: Diagnosis not present

## 2015-04-06 MED ORDER — LEVOFLOXACIN 500 MG PO TABS: 500.0000 mg | ORAL_TABLET | Freq: Every day | ORAL | Status: DC

## 2015-04-06 NOTE — Progress Notes (Signed)
Date:  04/06/2015   Name:  Sandra Mckinney   DOB:  Jul 13, 1945   MRN:  [id number]   Chief Complaint: Sinusitis and Cough  Patient complains of onset about a week and half ago of upper respiratory symptoms. She has cough and sore throat head congestion postnasal drip. She is blowing out thick yellow and green nasal mucus. She has some upper chest discomfort but no wheezing or productive cough. She denies shortness of breath or fever. She believes she got this from her daughter-in-law. Breast cancer - patient was diagnosed with ductal carcinoma in situ of the left breast. She underwent mastectomy and also right sided breast reduction. Unfortunately the tissue from the right breast was also positive for cancer. She subsequently underwent a right mastectomy. She also had augmentation bilaterally at the time of her surgeries. She is slowly recovering. Still somewhat weak. She is back to taking care of her grandchildren.  Review of Systems:  Review of Systems  Constitutional: Positive for fever and fatigue. Negative for chills.  HENT: Positive for congestion, postnasal drip, sinus pressure and sore throat. Negative for ear pain and trouble swallowing.   Eyes: Negative for photophobia and visual disturbance.  Respiratory: Positive for cough. Negative for shortness of breath and wheezing.   Cardiovascular: Positive for chest pain. Negative for palpitations.  Hematological: Negative for adenopathy. Does not bruise/bleed easily.    Patient Active Problem List   Diagnosis Date Noted  . Cancer of upper-outer quadrant of female breast 01/03/2015  . Breast neoplasm, Tis (DCIS) 12/27/2014  . Anxiety disorder 10/11/2014  . Bloodgood disease 10/11/2014  . Plantar fasciitis 10/11/2014  . Fibrositis 10/11/2014  . Local edema 10/11/2014  . Depression, major, single episode, in partial remission 10/11/2014  . Hot flash, menopausal 10/11/2014  . Migraine without aura and responsive to treatment  10/11/2014  . Idiopathic insomnia 10/11/2014  . Psoriasis 10/11/2014  . Restless leg 10/11/2014  . Avitaminosis D 10/11/2014    Prior to Admission medications   Medication Sig Start Date End Date Taking? Authorizing Provider  betamethasone dipropionate (DIPROLENE) 0.05 % cream Apply 1 application topically 2 (two) times daily.   Yes Historical Provider, MD  busPIRone (BUSPAR) 10 MG tablet TAKE 1 TABLET FOUR TIMES DAILY 01/10/15  Yes Glean Hess, MD  Calcium-Magnesium-Vitamin D 967-59-163 MG-MG-UNIT TB24 Take 1 tablet by mouth daily.   Yes Historical Provider, MD  cholecalciferol (VITAMIN D) 1000 UNITS tablet Take 1 tablet by mouth daily.   Yes Historical Provider, MD  docusate sodium (COLACE) 100 MG capsule Take 100 mg by mouth. 02/06/15  Yes Historical Provider, MD  estrogens, conjugated, (PREMARIN) 0.625 MG tablet Take 1 tablet by mouth daily. 10/10/14  Yes Historical Provider, MD  furosemide (LASIX) 20 MG tablet Take 1 tablet by mouth daily. 10/10/14  Yes Historical Provider, MD  gabapentin (NEURONTIN) 100 MG capsule Take 1 capsule by mouth 3 (three) times daily. 03/17/15  Yes Historical Provider, MD  HYDROcodone-acetaminophen (NORCO/VICODIN) 5-325 MG per tablet Take 1 tablet by mouth 3 (three) times daily. 03/09/15  Yes Glean Hess, MD  metaxalone (SKELAXIN) 800 MG tablet Take 1 tablet by mouth every 8 (eight) hours. 11/11/13  Yes Historical Provider, MD  MULTIPLE VITAMINS-MINERALS PO Take 1 tablet by mouth daily.   Yes Historical Provider, MD  propranolol ER (INDERAL LA) 60 MG 24 hr capsule Take 1 capsule by mouth daily. 08/31/14  Yes Historical Provider, MD  rOPINIRole (REQUIP) 0.5 MG tablet TAKE 5 TABLETS AT BEDTIME 03/19/15  Yes Glean Hess, MD  tamoxifen (NOLVADEX) 20 MG tablet Take 1 tablet by mouth daily. 04/02/15  Yes Historical Provider, MD  cloNIDine (CATAPRES) 0.1 MG tablet Take 1 tablet by mouth at bedtime. 03/02/15   Historical Provider, MD  zolpidem (AMBIEN) 5 MG tablet Take 5  mg by mouth at bedtime as needed for sleep.    Historical Provider, MD    Allergies  Allergen Reactions  . Letrozole Swelling  . Cephalosporins   . Duloxetine Hcl   . Lyrica [Pregabalin]   . Oxycodone   . Penicillins   . Prilosec  [Omeprazole]   . Sulfa Antibiotics     Past Surgical History  Procedure Laterality Date  . Carpal tunnel release Right 2014  . Breast surgery Bilateral breast reduction    2000  . Abdominal hysterectomy  1986    uterine fibroids and endometriosis  . Eye surgery Bilateral 2009    catarcts  . Thumb arthroscopy Right 2014  . Breast biopsy Right 06/01/12    [place] bx/clip-neg  . Reduction mammaplasty      2001    Social History  Substance Use Topics  . Smoking status: Never Smoker   . Smokeless tobacco: Never Used  . Alcohol Use: No     Medication list has been reviewed and updated.  Physical Examination:  Physical Exam  Constitutional: She is oriented to person, place, and time. She appears well-developed and well-nourished. No distress.  HENT:  Head: Normocephalic and atraumatic.  Right Ear: Tympanic membrane and ear canal normal.  Left Ear: Tympanic membrane and ear canal normal.  Nose: Right sinus exhibits maxillary sinus tenderness. Right sinus exhibits no frontal sinus tenderness. Left sinus exhibits maxillary sinus tenderness. Left sinus exhibits no frontal sinus tenderness.  Mouth/Throat: Oropharynx is clear and moist.  Eyes: Conjunctivae are normal. Right eye exhibits no discharge. Left eye exhibits no discharge. No scleral icterus.  Neck: Normal range of motion.  Cardiovascular: Normal rate, regular rhythm and normal heart sounds.   Pulmonary/Chest: Effort normal and breath sounds normal. No respiratory distress.  Musculoskeletal: Normal range of motion.  Neurological: She is alert and oriented to person, place, and time.  Skin: Skin is warm and dry. No rash noted.  Psychiatric: She has a normal mood and affect. Her speech is  normal and behavior is normal. Thought content normal.  Nursing note and vitals reviewed.   Pulse 64  Temp(Src) 97.6 F (36.4 C)  Ht 5\' 2"  (1.575 m)  Wt 167 lb (75.751 kg)  BMI 30.54 kg/m2  SpO2 96%  Assessment and Plan: 1. Acute sinusitis, recurrence not specified, unspecified location Begin Allegra 180 mg daily for postnasal drip - levofloxacin (LEVAQUIN) 500 MG tablet; Take 1 tablet (500 mg total) by mouth daily.  Dispense: 10 tablet; Refill: 0  2. Neoplasm of right breast, primary tumor staging category Tis: ductal carcinoma in situ (DCIS) Status post surgery and reconstruction   3. Neoplasm of left breast, primary tumor staging category Tis: ductal carcinoma in situ (DCIS) Status post surgery and reconstruction   Halina Maidens, MD [facility]  04/06/2015

## 2015-04-06 NOTE — Patient Instructions (Signed)
Take Allegra 180 mg once a day for post nasal drainage and cough

## 2015-04-15 ENCOUNTER — Other Ambulatory Visit: Payer: Self-pay | Admitting: Internal Medicine

## 2015-05-01 ENCOUNTER — Other Ambulatory Visit: Payer: Self-pay | Admitting: Internal Medicine

## 2015-05-01 ENCOUNTER — Telehealth: Payer: Self-pay

## 2015-05-01 ENCOUNTER — Other Ambulatory Visit: Payer: Self-pay

## 2015-05-01 DIAGNOSIS — J019 Acute sinusitis, unspecified: Secondary | ICD-10-CM

## 2015-05-01 MED ORDER — LEVOFLOXACIN 500 MG PO TABS: 500.0000 mg | ORAL_TABLET | Freq: Every day | ORAL | Status: DC

## 2015-05-01 MED ORDER — HYDROCODONE-ACETAMINOPHEN 5-325 MG PO TABS: 1.0000 | ORAL_TABLET | Freq: Three times a day (TID) | ORAL | Status: DC

## 2015-05-01 NOTE — Telephone Encounter (Signed)
Patient thought was better after Levaquin but is getting green sputum again and having trouble breathing at night. Would like more Levaquin to try and knock it out. dr

## 2015-05-28 ENCOUNTER — Other Ambulatory Visit: Payer: Self-pay

## 2015-05-28 MED ORDER — HYDROCODONE-ACETAMINOPHEN 5-325 MG PO TABS: 1.0000 | ORAL_TABLET | Freq: Three times a day (TID) | ORAL | Status: DC

## 2015-06-11 ENCOUNTER — Ambulatory Visit (INDEPENDENT_AMBULATORY_CARE_PROVIDER_SITE_OTHER): Payer: Medicare PPO | Admitting: Internal Medicine

## 2015-06-11 ENCOUNTER — Encounter: Payer: Self-pay | Admitting: Internal Medicine

## 2015-06-11 VITALS — HR 72 | Ht 62.0 in | Wt 174.8 lb

## 2015-06-11 DIAGNOSIS — D0512 Intraductal carcinoma in situ of left breast: Secondary | ICD-10-CM | POA: Diagnosis not present

## 2015-06-11 DIAGNOSIS — F324 Major depressive disorder, single episode, in partial remission: Secondary | ICD-10-CM | POA: Diagnosis not present

## 2015-06-11 DIAGNOSIS — G2581 Restless legs syndrome: Secondary | ICD-10-CM

## 2015-06-11 DIAGNOSIS — D0511 Intraductal carcinoma in situ of right breast: Secondary | ICD-10-CM

## 2015-06-11 DIAGNOSIS — M797 Fibromyalgia: Secondary | ICD-10-CM | POA: Diagnosis not present

## 2015-06-11 MED ORDER — ROPINIROLE HCL 0.5 MG PO TABS: 3.5000 mg | ORAL_TABLET | Freq: Every day | ORAL | Status: DC

## 2015-06-11 MED ORDER — HYDROCODONE-ACETAMINOPHEN 5-325 MG PO TABS: 1.0000 | ORAL_TABLET | Freq: Three times a day (TID) | ORAL | Status: DC

## 2015-06-11 NOTE — Progress Notes (Signed)
Date:  06/11/2015   Name:  Sandra Mckinney   DOB:  September 17, 1945   MRN:  [id number]   Chief Complaint: Fibromyalgia  Patient reports that her fibromyalgia symptoms are stable. She has not felt any taking any additional pain medication. Her sleep is fairly good as long she can control her restless leg syndrome. She is a little anxious about her upcoming breast surgery. She has less physical stress now since she's no longer caring for her 2 small grandchildren full-time.  Restless Leg Syndrome - symptoms are worse since stopping HRT.  Now on 3.5 mg of Requip at 7 PM.  Also on magnesium supplement and a multi-vitamin.  Depression - mood is slightly worse since going through breast surgery.  She now needs to have another surgery in January. Her mood is doing fair on BuSpar. She doesn't want to take another medication at this time.  Breast cancer - patient status post bilateral mastectomies. She is anticipating another surgery in January. Currently everything is well healed. She's been told not to have her blood pressures checked in either arm; only to use the ankle or leg.  Review of Systems  Constitutional: Negative for fever, chills and fatigue.  Respiratory: Negative for cough, shortness of breath and wheezing.   Cardiovascular: Negative for chest pain, palpitations and leg swelling.  Gastrointestinal: Negative for constipation.  Musculoskeletal: Positive for myalgias and arthralgias (restless leg syndrome). Negative for joint swelling.  Neurological: Negative for tremors and weakness.  Psychiatric/Behavioral: Positive for dysphoric mood. Negative for sleep disturbance. The patient is not nervous/anxious.     Patient Active Problem List   Diagnosis Date Noted  . Neoplasm of left breast, primary tumor staging category Tis: ductal carcinoma in situ (DCIS) 01/03/2015  . Neoplasm of right breast, primary tumor staging category Tis: ductal carcinoma in situ (DCIS) 12/27/2014  . Anxiety  disorder 10/11/2014  . Plantar fasciitis 10/11/2014  . Fibrositis 10/11/2014  . Local edema 10/11/2014  . Depression, major, single episode, in partial remission ([place]) 10/11/2014  . Hot flash, menopausal 10/11/2014  . Migraine without aura and responsive to treatment 10/11/2014  . Idiopathic insomnia 10/11/2014  . Psoriasis 10/11/2014  . Restless leg 10/11/2014  . Avitaminosis D 10/11/2014    Prior to Admission medications   Medication Sig Start Date End Date Taking? Authorizing Provider  betamethasone dipropionate (DIPROLENE) 0.05 % cream Apply 1 application topically 2 (two) times daily.   Yes Historical Provider, MD  busPIRone (BUSPAR) 10 MG tablet TAKE 1 TABLET FOUR TIMES DAILY 01/10/15  Yes Glean Hess, MD  Calcium-Magnesium-Vitamin D [id number] MG-MG-UNIT TB24 Take 1 tablet by mouth daily.   Yes Historical Provider, MD  cholecalciferol (VITAMIN D) 1000 UNITS tablet Take 1 tablet by mouth daily.   Yes Historical Provider, MD  cloNIDine (CATAPRES) 0.1 MG tablet Take 1 tablet by mouth at bedtime. 03/02/15  Yes Historical Provider, MD  docusate sodium (COLACE) 100 MG capsule Take 100 mg by mouth. 02/06/15  Yes Historical Provider, MD  furosemide (LASIX) 20 MG tablet Take 1 tablet by mouth daily. 10/10/14  Yes Historical Provider, MD  gabapentin (NEURONTIN) 100 MG capsule TAKE 1 CAPSULE (100 MG TOTAL) BY MOUTH THREE (3) TIMES A DAY. 04/15/15  Yes Glean Hess, MD  HYDROcodone-acetaminophen (NORCO/VICODIN) 5-325 MG tablet Take 1 tablet by mouth 3 (three) times daily. 05/28/15  Yes Glean Hess, MD  metaxalone (SKELAXIN) 800 MG tablet Take 1 tablet by mouth every 8 (eight) hours. 11/11/13  Yes Historical Provider, MD  MULTIPLE VITAMINS-MINERALS PO Take 1 tablet by mouth daily.   Yes Historical Provider, MD  propranolol ER (INDERAL LA) 60 MG 24 hr capsule Take 1 capsule by mouth daily. 08/31/14  Yes Historical Provider, MD  rOPINIRole (REQUIP) 0.5 MG tablet TAKE 5 TABLETS AT BEDTIME  03/19/15  Yes Glean Hess, MD  zolpidem (AMBIEN) 5 MG tablet Take 5 mg by mouth at bedtime as needed for sleep.   Yes Historical Provider, MD    Allergies  Allergen Reactions  . Letrozole Swelling  . Selective Estrogen Receptor Modulators Hives  . Cephalosporins   . Duloxetine Hcl   . Lyrica [Pregabalin]   . Oxycodone   . Penicillins   . Prilosec  [Omeprazole]   . Sulfa Antibiotics     Past Surgical History  Procedure Laterality Date  . Carpal tunnel release Right 2014  . Breast surgery Bilateral breast reduction    2000  . Abdominal hysterectomy  1986    uterine fibroids and endometriosis  . Eye surgery Bilateral 2009    catarcts  . Thumb arthroscopy Right 2014  . Breast biopsy Right 06/01/12    [place] bx/clip-neg  . Reduction mammaplasty      2001  . Mastectomy, radical Bilateral 2016    Social History  Substance Use Topics  . Smoking status: Never Smoker   . Smokeless tobacco: Never Used  . Alcohol Use: No    Medication list has been reviewed and updated.   Physical Exam  Constitutional: She is oriented to person, place, and time. She appears well-developed and well-nourished. No distress.  HENT:  Head: Normocephalic and atraumatic.  Eyes: Conjunctivae are normal. Right eye exhibits no discharge. Left eye exhibits no discharge. No scleral icterus.  Cardiovascular: Normal rate, regular rhythm and normal heart sounds.   Pulmonary/Chest: Effort normal and breath sounds normal. No respiratory distress.  Musculoskeletal: Normal range of motion. She exhibits no edema.  Neurological: She is alert and oriented to person, place, and time.  Skin: Skin is warm and dry. No rash noted.  Psychiatric: She has a normal mood and affect. Her behavior is normal. Thought content normal.    Pulse 72  Ht 5\' 2"  (1.575 m)  Wt 174 lb 12.8 oz (79.289 kg)  BMI 31.96 kg/m2  Assessment and Plan: 1. Restless leg Increase dose to 3.5 mg for now; recommend periodically reducing  dose to lowest effective dose - rOPINIRole (REQUIP) 0.5 MG tablet; Take 7 tablets (3.5 mg total) by mouth at bedtime.  Dispense: 630 tablet; Refill: 3  2. Fibrositis Chronic and unchanged - HYDROcodone-acetaminophen (NORCO/VICODIN) 5-325 MG tablet; Take 1 tablet by mouth 3 (three) times daily.  Dispense: 90 tablet; Refill: 0  3. Neoplasm of left breast, primary tumor staging category Tis: ductal carcinoma in situ (DCIS)  4. Neoplasm of right breast, primary tumor staging category Tis: ductal carcinoma in situ (DCIS)  5. Depression, major, single episode, in partial remission ([place]) Continue Buspar; monitor for worsening symptoms and return if needed   Halina Maidens, MD [facility]  06/11/2015

## 2015-06-18 DIAGNOSIS — C50412 Malignant neoplasm of upper-outer quadrant of left female breast: Secondary | ICD-10-CM | POA: Diagnosis not present

## 2015-06-18 DIAGNOSIS — I10 Essential (primary) hypertension: Secondary | ICD-10-CM | POA: Diagnosis not present

## 2015-06-18 DIAGNOSIS — Z872 Personal history of diseases of the skin and subcutaneous tissue: Secondary | ICD-10-CM | POA: Diagnosis not present

## 2015-06-18 DIAGNOSIS — R232 Flushing: Secondary | ICD-10-CM | POA: Diagnosis not present

## 2015-06-18 DIAGNOSIS — M797 Fibromyalgia: Secondary | ICD-10-CM | POA: Diagnosis not present

## 2015-06-18 DIAGNOSIS — D485 Neoplasm of uncertain behavior of skin: Secondary | ICD-10-CM | POA: Diagnosis not present

## 2015-06-18 DIAGNOSIS — T8542XD Displacement of breast prosthesis and implant, subsequent encounter: Secondary | ICD-10-CM | POA: Diagnosis not present

## 2015-06-18 DIAGNOSIS — D0511 Intraductal carcinoma in situ of right breast: Secondary | ICD-10-CM | POA: Diagnosis not present

## 2015-06-18 DIAGNOSIS — M549 Dorsalgia, unspecified: Secondary | ICD-10-CM | POA: Diagnosis not present

## 2015-06-18 DIAGNOSIS — C50919 Malignant neoplasm of unspecified site of unspecified female breast: Secondary | ICD-10-CM | POA: Diagnosis not present

## 2015-06-18 DIAGNOSIS — L821 Other seborrheic keratosis: Secondary | ICD-10-CM | POA: Diagnosis not present

## 2015-06-18 DIAGNOSIS — T85848D Pain due to other internal prosthetic devices, implants and grafts, subsequent encounter: Secondary | ICD-10-CM | POA: Diagnosis not present

## 2015-06-18 DIAGNOSIS — Z853 Personal history of malignant neoplasm of breast: Secondary | ICD-10-CM | POA: Diagnosis not present

## 2015-06-18 DIAGNOSIS — R5383 Other fatigue: Secondary | ICD-10-CM | POA: Diagnosis not present

## 2015-06-18 DIAGNOSIS — Z9882 Breast implant status: Secondary | ICD-10-CM | POA: Diagnosis not present

## 2015-06-18 DIAGNOSIS — Z1283 Encounter for screening for malignant neoplasm of skin: Secondary | ICD-10-CM | POA: Diagnosis not present

## 2015-06-18 DIAGNOSIS — Z808 Family history of malignant neoplasm of other organs or systems: Secondary | ICD-10-CM | POA: Diagnosis not present

## 2015-06-18 DIAGNOSIS — Z08 Encounter for follow-up examination after completed treatment for malignant neoplasm: Secondary | ICD-10-CM | POA: Diagnosis not present

## 2015-06-25 ENCOUNTER — Ambulatory Visit (INDEPENDENT_AMBULATORY_CARE_PROVIDER_SITE_OTHER): Payer: Medicare PPO | Admitting: Internal Medicine

## 2015-06-25 ENCOUNTER — Encounter: Payer: Self-pay | Admitting: Internal Medicine

## 2015-06-25 VITALS — HR 66 | Temp 97.7°F | Ht 62.0 in | Wt 173.4 lb

## 2015-06-25 DIAGNOSIS — J40 Bronchitis, not specified as acute or chronic: Secondary | ICD-10-CM | POA: Diagnosis not present

## 2015-06-25 MED ORDER — DOXYCYCLINE HYCLATE 100 MG PO TABS: 100.0000 mg | ORAL_TABLET | Freq: Two times a day (BID) | ORAL | Status: DC

## 2015-06-25 MED ORDER — HYDROCODONE-HOMATROPINE 5-1.5 MG/5ML PO SYRP: 5.0000 mL | ORAL_SOLUTION | Freq: Four times a day (QID) | ORAL | Status: DC | PRN

## 2015-06-25 NOTE — Progress Notes (Signed)
Date:  06/25/2015   Name:  Sandra Mckinney   DOB:  08-09-1945   MRN:  [id number]   Chief Complaint: Sinusitis Sinusitis This is a new problem. The current episode started in the past 7 days. The problem has been rapidly worsening since onset. There has been no fever. Associated symptoms include congestion, coughing, a hoarse voice, shortness of breath and sinus pressure. Pertinent negatives include no chills or sore throat. Past treatments include nothing.    Review of Systems  Constitutional: Positive for fatigue. Negative for chills.  HENT: Positive for congestion, hoarse voice, postnasal drip and sinus pressure. Negative for sore throat and trouble swallowing.   Respiratory: Positive for cough, chest tightness, shortness of breath and wheezing.   Cardiovascular: Negative for chest pain and palpitations.  Gastrointestinal: Negative for vomiting, abdominal pain and constipation.    Patient Active Problem List   Diagnosis Date Noted  . Intraductal carcinoma of breast 01/04/2015  . Neoplasm of left breast, primary tumor staging category Tis: ductal carcinoma in situ (DCIS) 01/03/2015  . Malignant neoplasm of upper-outer quadrant of female breast ([place]) 01/03/2015  . Neoplasm of right breast, primary tumor staging category Tis: ductal carcinoma in situ (DCIS) 12/27/2014  . Anxiety disorder 10/11/2014  . Plantar fasciitis 10/11/2014  . Fibrositis 10/11/2014  . Local edema 10/11/2014  . Depression, major, single episode, in partial remission ([place]) 10/11/2014  . Hot flash, menopausal 10/11/2014  . Migraine without aura and responsive to treatment 10/11/2014  . Idiopathic insomnia 10/11/2014  . Psoriasis 10/11/2014  . Restless leg 10/11/2014  . Avitaminosis D 10/11/2014    Prior to Admission medications   Medication Sig Start Date End Date Taking? Authorizing Provider  betamethasone dipropionate (DIPROLENE) 0.05 % cream Apply 1 application topically 2 (two) times daily.     Historical Provider, MD  busPIRone (BUSPAR) 10 MG tablet TAKE 1 TABLET FOUR TIMES DAILY 01/10/15   Glean Hess, MD  Calcium-Magnesium-Vitamin D [id number] MG-MG-UNIT TB24 Take 1 tablet by mouth daily.    Historical Provider, MD  cholecalciferol (VITAMIN D) 1000 UNITS tablet Take 1 tablet by mouth daily.    Historical Provider, MD  cloNIDine (CATAPRES) 0.1 MG tablet Take 1 tablet by mouth at bedtime. 03/02/15   Historical Provider, MD  docusate sodium (COLACE) 100 MG capsule Take 100 mg by mouth. 02/06/15   Historical Provider, MD  furosemide (LASIX) 20 MG tablet Take 1 tablet by mouth daily. 10/10/14   Historical Provider, MD  gabapentin (NEURONTIN) 100 MG capsule TAKE 1 CAPSULE (100 MG TOTAL) BY MOUTH THREE (3) TIMES A DAY. 04/15/15   Glean Hess, MD  HYDROcodone-acetaminophen (NORCO/VICODIN) 5-325 MG tablet Take 1 tablet by mouth 3 (three) times daily. 06/11/15   Glean Hess, MD  metaxalone (SKELAXIN) 800 MG tablet Take 1 tablet by mouth every 8 (eight) hours. 11/11/13   Historical Provider, MD  MULTIPLE VITAMINS-MINERALS PO Take 1 tablet by mouth daily.    Historical Provider, MD  propranolol ER (INDERAL LA) 60 MG 24 hr capsule Take 1 capsule by mouth daily. 08/31/14   Historical Provider, MD  rOPINIRole (REQUIP) 0.5 MG tablet Take 7 tablets (3.5 mg total) by mouth at bedtime. 06/11/15   Glean Hess, MD  zolpidem (AMBIEN) 5 MG tablet Take 5 mg by mouth at bedtime as needed for sleep.    Historical Provider, MD    Allergies  Allergen Reactions  . Letrozole Swelling  . Selective Estrogen Receptor Modulators Hives  . Cephalosporins   .  Duloxetine Hcl   . Lyrica [Pregabalin]   . Oxycodone   . Penicillins   . Prilosec  [Omeprazole]   . Sulfa Antibiotics     Past Surgical History  Procedure Laterality Date  . Carpal tunnel release Right 2014  . Breast surgery Bilateral breast reduction    2000  . Abdominal hysterectomy  1986    uterine fibroids and endometriosis  . Eye  surgery Bilateral 2009    catarcts  . Thumb arthroscopy Right 2014  . Breast biopsy Right 06/01/12    [place] bx/clip-neg  . Reduction mammaplasty      2001  . Mastectomy, radical Bilateral 2016    Social History  Substance Use Topics  . Smoking status: Never Smoker   . Smokeless tobacco: Never Used  . Alcohol Use: No     Medication list has been reviewed and updated.   Physical Exam  Constitutional: She is oriented to person, place, and time. She appears well-developed and well-nourished.  HENT:  Right Ear: External ear and ear canal normal. Tympanic membrane is not erythematous and not retracted.  Left Ear: External ear and ear canal normal. Tympanic membrane is not erythematous and not retracted.  Nose: Right sinus exhibits maxillary sinus tenderness. Right sinus exhibits no frontal sinus tenderness. Left sinus exhibits maxillary sinus tenderness. Left sinus exhibits no frontal sinus tenderness.  Mouth/Throat: Uvula is midline and mucous membranes are normal. No oral lesions. Posterior oropharyngeal erythema present. No oropharyngeal exudate.  Cardiovascular: Normal rate, regular rhythm and normal heart sounds.   Pulmonary/Chest: Effort normal and breath sounds normal. No respiratory distress. She has no wheezes. She has no rales.  Lymphadenopathy:    She has no cervical adenopathy.  Neurological: She is alert and oriented to person, place, and time.  Psychiatric: She has a normal mood and affect.    Pulse 66  Temp(Src) 97.7 F (36.5 C)  Ht 5\' 2"  (1.575 m)  Wt 173 lb 6.4 oz (78.654 kg)  BMI 31.71 kg/m2  SpO2 97%  Assessment and Plan: 1. Bronchitis - doxycycline (VIBRA-TABS) 100 MG tablet; Take 1 tablet (100 mg total) by mouth 2 (two) times daily.  Dispense: 20 tablet; Refill: 0 - HYDROcodone-homatropine (HYCODAN) 5-1.5 MG/5ML syrup; Take 5 mLs by mouth every 6 (six) hours as needed for cough.  Dispense: 120 mL; Refill: 0   Halina Maidens, MD [facility]  06/25/2015

## 2015-07-06 ENCOUNTER — Ambulatory Visit (INDEPENDENT_AMBULATORY_CARE_PROVIDER_SITE_OTHER): Payer: Medicare PPO | Admitting: Internal Medicine

## 2015-07-06 ENCOUNTER — Encounter: Payer: Self-pay | Admitting: Internal Medicine

## 2015-07-06 VITALS — HR 64 | Temp 97.4°F | Ht 62.0 in | Wt 175.0 lb

## 2015-07-06 DIAGNOSIS — R413 Other amnesia: Secondary | ICD-10-CM | POA: Diagnosis not present

## 2015-07-06 DIAGNOSIS — J019 Acute sinusitis, unspecified: Secondary | ICD-10-CM

## 2015-07-06 MED ORDER — ALBUTEROL SULFATE HFA 108 (90 BASE) MCG/ACT IN AERS: 2.0000 | INHALATION_SPRAY | Freq: Four times a day (QID) | RESPIRATORY_TRACT | Status: DC | PRN

## 2015-07-06 MED ORDER — LEVOFLOXACIN 500 MG PO TABS: 500.0000 mg | ORAL_TABLET | Freq: Every day | ORAL | Status: DC

## 2015-07-06 NOTE — Patient Instructions (Signed)
Take Mucinex (plain) twice a day -  Can also take chlortrimeton as needed -

## 2015-07-06 NOTE — Progress Notes (Signed)
Date:  07/06/2015   Name:  Sandra Mckinney   DOB:  09-May-1946   MRN:  [id number]   Chief Complaint: Sinusitis Sinusitis The current episode started in the past 7 days. The problem is unchanged. There has been no fever. Associated symptoms include congestion, coughing, sinus pressure and sneezing. Pertinent negatives include no chills, ear pain, headaches or shortness of breath. Past treatments include antibiotics (doxycycline). The treatment provided mild relief.   Memory concerns - Patient and husband are concerned about some memory issues. These have been occurring over the past year or more. Mostly seem to related to distant events that are significant but that she cannot recall. Her mother was diagnosed with Alzheimer's and her paternal aunt had some other type of dementia. According to her husband she is not having issues that are severe enough to cause any danger to herself.   Review of Systems  Constitutional: Positive for fatigue. Negative for fever and chills.  HENT: Positive for congestion, sinus pressure and sneezing. Negative for ear pain.   Respiratory: Positive for cough and chest tightness. Negative for shortness of breath and wheezing.   Cardiovascular: Negative for chest pain and palpitations.  Gastrointestinal: Negative for nausea, abdominal pain and diarrhea.  Neurological: Negative for tremors, seizures and headaches.  Psychiatric/Behavioral: Positive for confusion. The patient is not nervous/anxious.     Patient Active Problem List   Diagnosis Date Noted  . Intraductal carcinoma of breast 01/04/2015  . Neoplasm of left breast, primary tumor staging category Tis: ductal carcinoma in situ (DCIS) 01/03/2015  . Malignant neoplasm of upper-outer quadrant of female breast ([place]) 01/03/2015  . Neoplasm of right breast, primary tumor staging category Tis: ductal carcinoma in situ (DCIS) 12/27/2014  . Anxiety disorder 10/11/2014  . Plantar fasciitis 10/11/2014    . Fibrositis 10/11/2014  . Local edema 10/11/2014  . Depression, major, single episode, in partial remission ([place]) 10/11/2014  . Hot flash, menopausal 10/11/2014  . Migraine without aura and responsive to treatment 10/11/2014  . Idiopathic insomnia 10/11/2014  . Psoriasis 10/11/2014  . Restless leg 10/11/2014  . Avitaminosis D 10/11/2014    Prior to Admission medications   Medication Sig Start Date End Date Taking? Authorizing Provider  busPIRone (BUSPAR) 10 MG tablet TAKE 1 TABLET FOUR TIMES DAILY 01/10/15  Yes Glean Hess, MD  Calcium-Magnesium-Vitamin D [id number] MG-MG-UNIT TB24 Take 1 tablet by mouth daily.   Yes Historical Provider, MD  cholecalciferol (VITAMIN D) 1000 UNITS tablet Take 1 tablet by mouth daily.   Yes Historical Provider, MD  cloNIDine (CATAPRES) 0.1 MG tablet Take 1 tablet by mouth at bedtime. 03/02/15  Yes Historical Provider, MD  docusate sodium (COLACE) 100 MG capsule Take 100 mg by mouth. 02/06/15  Yes Historical Provider, MD  exemestane (AROMASIN) 25 MG tablet Take 25 mg by mouth daily after breakfast.   Yes Historical Provider, MD  furosemide (LASIX) 20 MG tablet Take 1 tablet by mouth daily. 10/10/14  Yes Historical Provider, MD  gabapentin (NEURONTIN) 100 MG capsule TAKE 1 CAPSULE (100 MG TOTAL) BY MOUTH THREE (3) TIMES A DAY. 04/15/15  Yes Glean Hess, MD  HYDROcodone-acetaminophen (NORCO/VICODIN) 5-325 MG tablet Take 1 tablet by mouth 3 (three) times daily. 06/11/15  Yes Glean Hess, MD  HYDROcodone-homatropine [facility]) 5-1.5 MG/5ML syrup Take 5 mLs by mouth every 6 (six) hours as needed for cough. 06/25/15  Yes Glean Hess, MD  metaxalone (SKELAXIN) 800 MG tablet Take 1 tablet by mouth every 8 (  eight) hours. 11/11/13  Yes Historical Provider, MD  MULTIPLE VITAMINS-MINERALS PO Take 1 tablet by mouth daily.   Yes Historical Provider, MD  propranolol ER (INDERAL LA) 60 MG 24 hr capsule Take 1 capsule by mouth daily. 08/31/14  Yes Historical Provider,  MD  rOPINIRole (REQUIP) 0.5 MG tablet Take 7 tablets (3.5 mg total) by mouth at bedtime. 06/11/15  Yes Glean Hess, MD  zolpidem (AMBIEN) 5 MG tablet Take 5 mg by mouth at bedtime as needed for sleep.   Yes Historical Provider, MD  betamethasone dipropionate (DIPROLENE) 0.05 % cream Apply 1 application topically 2 (two) times daily.    Historical Provider, MD    Allergies  Allergen Reactions  . Letrozole Swelling  . Selective Estrogen Receptor Modulators Hives  . Cephalosporins   . Duloxetine Hcl   . Lyrica [Pregabalin]   . Oxycodone   . Penicillins   . Prilosec  [Omeprazole]   . Sulfa Antibiotics     Past Surgical History  Procedure Laterality Date  . Carpal tunnel release Right 2014  . Breast surgery Bilateral breast reduction    2000  . Abdominal hysterectomy  1986    uterine fibroids and endometriosis  . Eye surgery Bilateral 2009    catarcts  . Thumb arthroscopy Right 2014  . Breast biopsy Right 06/01/12    [place] bx/clip-neg  . Reduction mammaplasty      2001  . Mastectomy, radical Bilateral 2016    Social History  Substance Use Topics  . Smoking status: Never Smoker   . Smokeless tobacco: Never Used  . Alcohol Use: No    Medication list has been reviewed and updated.  Physical Exam  Constitutional: She is oriented to person, place, and time. She appears well-developed and well-nourished.  HENT:  Right Ear: External ear and ear canal normal. Tympanic membrane is not erythematous and not retracted.  Left Ear: External ear and ear canal normal. Tympanic membrane is not erythematous and not retracted.  Nose: Right sinus exhibits maxillary sinus tenderness and frontal sinus tenderness. Left sinus exhibits maxillary sinus tenderness and frontal sinus tenderness.  Mouth/Throat: Uvula is midline and mucous membranes are normal. No oral lesions. No oropharyngeal exudate or posterior oropharyngeal erythema.  Cardiovascular: Normal rate, regular rhythm and normal heart  sounds.   Pulmonary/Chest: Breath sounds normal. She has no wheezes. She has no rales.  Lymphadenopathy:    She has no cervical adenopathy.  Neurological: She is alert and oriented to person, place, and time.  Psychiatric: She has a normal mood and affect. Her behavior is normal. Judgment and thought content normal.    Pulse 64  Temp(Src) 97.4 F (36.3 C)  Ht 5\' 2"  (1.575 m)  Wt 175 lb (79.379 kg)  BMI 32.00 kg/m2  SpO2 97%  Assessment and Plan: 1. Acute sinusitis, recurrence not specified, unspecified location Begin Mucinex plain; resume decongestant otc - levofloxacin (LEVAQUIN) 500 MG tablet; Take 1 tablet (500 mg total) by mouth daily.  Dispense: 10 tablet; Refill: 0 - albuterol (PROVENTIL HFA;VENTOLIN HFA) 108 (90 Base) MCG/ACT inhaler; Inhale 2 puffs into the lungs every 6 (six) hours as needed for wheezing or shortness of breath.  Dispense: 1 Inhaler; Refill: 0  2. Memory disorder Request that she and husband monitor symptoms Return for OV and lab evaluation after surgery   Halina Maidens, MD [facility]  07/06/2015

## 2015-07-08 HISTORY — PX: BREAST RECONSTRUCTION: SHX9

## 2015-07-16 DIAGNOSIS — Z853 Personal history of malignant neoplasm of breast: Secondary | ICD-10-CM | POA: Diagnosis not present

## 2015-07-16 DIAGNOSIS — N651 Disproportion of reconstructed breast: Secondary | ICD-10-CM | POA: Diagnosis not present

## 2015-07-16 DIAGNOSIS — Z9013 Acquired absence of bilateral breasts and nipples: Secondary | ICD-10-CM | POA: Diagnosis not present

## 2015-07-16 DIAGNOSIS — N62 Hypertrophy of breast: Secondary | ICD-10-CM | POA: Diagnosis not present

## 2015-08-03 ENCOUNTER — Other Ambulatory Visit: Payer: Self-pay

## 2015-08-03 DIAGNOSIS — M797 Fibromyalgia: Secondary | ICD-10-CM

## 2015-08-03 DIAGNOSIS — J40 Bronchitis, not specified as acute or chronic: Secondary | ICD-10-CM

## 2015-08-03 MED ORDER — HYDROCODONE-ACETAMINOPHEN 5-325 MG PO TABS: 1.0000 | ORAL_TABLET | Freq: Three times a day (TID) | ORAL | Status: DC

## 2015-08-03 NOTE — Telephone Encounter (Signed)
Patient called for refill of medication.

## 2015-08-22 ENCOUNTER — Other Ambulatory Visit: Payer: Self-pay | Admitting: Internal Medicine

## 2015-08-27 DIAGNOSIS — D2271 Melanocytic nevi of right lower limb, including hip: Secondary | ICD-10-CM | POA: Diagnosis not present

## 2015-08-27 DIAGNOSIS — H5340 Unspecified visual field defects: Secondary | ICD-10-CM | POA: Diagnosis not present

## 2015-08-27 DIAGNOSIS — D485 Neoplasm of uncertain behavior of skin: Secondary | ICD-10-CM | POA: Diagnosis not present

## 2015-08-30 ENCOUNTER — Other Ambulatory Visit: Payer: Self-pay

## 2015-08-30 DIAGNOSIS — M797 Fibromyalgia: Secondary | ICD-10-CM

## 2015-08-30 NOTE — Telephone Encounter (Signed)
Patient called for refill on her hydrocodone

## 2015-08-31 MED ORDER — HYDROCODONE-ACETAMINOPHEN 5-325 MG PO TABS: 1.0000 | ORAL_TABLET | Freq: Three times a day (TID) | ORAL | Status: DC

## 2015-09-10 DIAGNOSIS — D2271 Melanocytic nevi of right lower limb, including hip: Secondary | ICD-10-CM | POA: Diagnosis not present

## 2015-09-10 DIAGNOSIS — D485 Neoplasm of uncertain behavior of skin: Secondary | ICD-10-CM | POA: Diagnosis not present

## 2015-09-17 DIAGNOSIS — C50919 Malignant neoplasm of unspecified site of unspecified female breast: Secondary | ICD-10-CM | POA: Diagnosis not present

## 2015-09-17 DIAGNOSIS — C50412 Malignant neoplasm of upper-outer quadrant of left female breast: Secondary | ICD-10-CM | POA: Diagnosis not present

## 2015-09-17 DIAGNOSIS — Z79899 Other long term (current) drug therapy: Secondary | ICD-10-CM | POA: Diagnosis not present

## 2015-09-17 DIAGNOSIS — Z23 Encounter for immunization: Secondary | ICD-10-CM | POA: Diagnosis not present

## 2015-09-26 ENCOUNTER — Other Ambulatory Visit: Payer: Self-pay

## 2015-09-26 MED ORDER — GABAPENTIN 100 MG PO CAPS: 100.0000 mg | ORAL_CAPSULE | Freq: Three times a day (TID) | ORAL | Status: DC

## 2015-09-26 NOTE — Telephone Encounter (Signed)
Patient called in requesting refill

## 2015-09-27 ENCOUNTER — Other Ambulatory Visit: Payer: Self-pay

## 2015-09-27 DIAGNOSIS — M797 Fibromyalgia: Secondary | ICD-10-CM

## 2015-09-27 MED ORDER — HYDROCODONE-ACETAMINOPHEN 5-325 MG PO TABS: 1.0000 | ORAL_TABLET | Freq: Three times a day (TID) | ORAL | Status: DC

## 2015-09-27 NOTE — Telephone Encounter (Signed)
Patient called in today for refill

## 2015-09-30 ENCOUNTER — Other Ambulatory Visit: Payer: Self-pay | Admitting: Internal Medicine

## 2015-10-05 ENCOUNTER — Other Ambulatory Visit: Payer: Self-pay | Admitting: Internal Medicine

## 2015-10-10 ENCOUNTER — Encounter: Payer: Self-pay | Admitting: Internal Medicine

## 2015-10-10 ENCOUNTER — Ambulatory Visit (INDEPENDENT_AMBULATORY_CARE_PROVIDER_SITE_OTHER): Payer: Medicare PPO | Admitting: Internal Medicine

## 2015-10-10 VITALS — HR 51 | Temp 98.1°F | Ht 62.0 in | Wt 172.4 lb

## 2015-10-10 DIAGNOSIS — J40 Bronchitis, not specified as acute or chronic: Secondary | ICD-10-CM | POA: Diagnosis not present

## 2015-10-10 MED ORDER — LEVOFLOXACIN 500 MG PO TABS: 500.0000 mg | ORAL_TABLET | Freq: Every day | ORAL | Status: DC

## 2015-10-10 MED ORDER — HYDROCODONE-HOMATROPINE 5-1.5 MG/5ML PO SYRP: 5.0000 mL | ORAL_SOLUTION | Freq: Four times a day (QID) | ORAL | Status: DC | PRN

## 2015-10-10 NOTE — Progress Notes (Signed)
Date:  10/10/2015   Name:  Sandra Mckinney   DOB:  03-08-1946   MRN:  [id number]   Chief Complaint: Cough Cough This is a new problem. The current episode started in the past 7 days. The problem has been unchanged. The problem occurs every few minutes. The cough is non-productive. Associated symptoms include nasal congestion and postnasal drip. Pertinent negatives include no fever, shortness of breath or wheezing. The symptoms are aggravated by lying down. She has tried OTC cough suppressant for the symptoms. The treatment provided mild relief.      Review of Systems  Constitutional: Negative for fever.  HENT: Positive for postnasal drip.   Respiratory: Positive for cough. Negative for shortness of breath and wheezing.     Patient Active Problem List   Diagnosis Date Noted  . Intraductal carcinoma of breast 01/04/2015  . Neoplasm of left breast, primary tumor staging category Tis: ductal carcinoma in situ (DCIS) 01/03/2015  . Malignant neoplasm of upper-outer quadrant of female breast ([place]) 01/03/2015  . Neoplasm of right breast, primary tumor staging category Tis: ductal carcinoma in situ (DCIS) 12/27/2014  . Anxiety disorder 10/11/2014  . Plantar fasciitis 10/11/2014  . Fibrositis 10/11/2014  . Local edema 10/11/2014  . Depression, major, single episode, in partial remission ([place]) 10/11/2014  . Hot flash, menopausal 10/11/2014  . Migraine without aura and responsive to treatment 10/11/2014  . Idiopathic insomnia 10/11/2014  . Psoriasis 10/11/2014  . Restless leg 10/11/2014  . Avitaminosis D 10/11/2014    Prior to Admission medications   Medication Sig Start Date End Date Taking? Authorizing Provider  anastrozole (ARIMIDEX) 1 MG tablet Take 1 mg by mouth daily. 09/17/15 09/16/16 Yes Historical Provider, MD  betamethasone dipropionate (DIPROLENE) 0.05 % cream Apply 1 application topically 2 (two) times daily.   Yes Historical Provider, MD  busPIRone (BUSPAR) 10 MG  tablet TAKE 1 TABLET FOUR TIMES DAILY 10/05/15  Yes Glean Hess, MD  Calcium-Magnesium-Vitamin D [id number] MG-MG-UNIT TB24 Take 1 tablet by mouth daily.   Yes Historical Provider, MD  cholecalciferol (VITAMIN D) 1000 UNITS tablet Take 1 tablet by mouth daily.   Yes Historical Provider, MD  cloNIDine (CATAPRES) 0.1 MG tablet Take 1 tablet by mouth at bedtime. 03/02/15  Yes Historical Provider, MD  docusate sodium (COLACE) 100 MG capsule Take 100 mg by mouth. 02/06/15  Yes Historical Provider, MD  furosemide (LASIX) 20 MG tablet TAKE 1 TABLET EVERY DAY 08/22/15  Yes Glean Hess, MD  gabapentin (NEURONTIN) 100 MG capsule Take 1 capsule (100 mg total) by mouth 3 (three) times daily. 09/26/15  Yes Glean Hess, MD  HYDROcodone-acetaminophen (NORCO/VICODIN) 5-325 MG tablet Take 1 tablet by mouth 3 (three) times daily. 09/27/15  Yes Glean Hess, MD  metaxalone (SKELAXIN) 800 MG tablet Take 1 tablet by mouth every 8 (eight) hours. 11/11/13  Yes Historical Provider, MD  MULTIPLE VITAMINS-MINERALS PO Take 1 tablet by mouth daily.   Yes Historical Provider, MD  propranolol ER (INDERAL LA) 60 MG 24 hr capsule Take 1 capsule by mouth daily. 08/31/14  Yes Historical Provider, MD  rOPINIRole (REQUIP) 0.5 MG tablet Take 7 tablets (3.5 mg total) by mouth at bedtime. 06/11/15  Yes Glean Hess, MD  zolpidem (AMBIEN) 5 MG tablet Take 5 mg by mouth at bedtime as needed for sleep.   Yes Historical Provider, MD    Allergies  Allergen Reactions  . Cephalexin Hives  . Letrozole Swelling  . Raloxifene Hives  .  Selective Estrogen Receptor Modulators Hives  . Cephalosporins   . Duloxetine Hcl   . Lyrica [Pregabalin]   . Oxycodone   . Penicillins   . Prilosec  [Omeprazole]   . Sulfa Antibiotics     Past Surgical History  Procedure Laterality Date  . Carpal tunnel release Right 2014  . Breast surgery Bilateral breast reduction    2000  . Abdominal hysterectomy  1986    uterine fibroids and  endometriosis  . Eye surgery Bilateral 2009    catarcts  . Thumb arthroscopy Right 2014  . Breast biopsy Right 06/01/12    [place] bx/clip-neg  . Reduction mammaplasty      2001  . Mastectomy, radical Bilateral 2016    Social History  Substance Use Topics  . Smoking status: Never Smoker   . Smokeless tobacco: Never Used  . Alcohol Use: No     Medication list has been reviewed and updated.   Physical Exam  Constitutional: She is oriented to person, place, and time. She appears well-developed. No distress.  HENT:  Head: Normocephalic and atraumatic.  Neck: Normal range of motion.  Cardiovascular: Normal rate, regular rhythm and normal heart sounds.   Pulmonary/Chest: Effort normal. No respiratory distress. She has no decreased breath sounds. She has no wheezes. She has rhonchi in the right upper field and the left upper field.  Musculoskeletal: Normal range of motion.  Lymphadenopathy:    She has no cervical adenopathy.  Neurological: She is alert and oriented to person, place, and time.  Skin: Skin is warm and dry. No rash noted.  Psychiatric: She has a normal mood and affect. Her behavior is normal. Thought content normal.  Nursing note and vitals reviewed.   Pulse 51  Temp(Src) 98.1 F (36.7 C) (Oral)  Ht 5\' 2"  (1.575 m)  Wt 172 lb 6.4 oz (78.2 kg)  BMI 31.52 kg/m2  SpO2 97%  Assessment and Plan: 1. Bronchitis Begin Mucinex bid; increase fluids - levofloxacin (LEVAQUIN) 500 MG tablet; Take 1 tablet (500 mg total) by mouth daily.  Dispense: 7 tablet; Refill: 0 - HYDROcodone-homatropine (HYCODAN) 5-1.5 MG/5ML syrup; Take 5 mLs by mouth every 6 (six) hours as needed for cough.  Dispense: 120 mL; Refill: 0   Halina Maidens, MD [facility]  10/10/2015

## 2015-10-10 NOTE — Patient Instructions (Signed)

## 2015-10-12 ENCOUNTER — Other Ambulatory Visit: Payer: Self-pay

## 2015-10-12 MED ORDER — METAXALONE 800 MG PO TABS: 800.0000 mg | ORAL_TABLET | Freq: Three times a day (TID) | ORAL | Status: DC

## 2015-10-12 NOTE — Telephone Encounter (Signed)
Patient called in for refill.

## 2015-10-16 ENCOUNTER — Other Ambulatory Visit: Payer: Self-pay

## 2015-10-16 MED ORDER — METAXALONE 800 MG PO TABS: 800.0000 mg | ORAL_TABLET | Freq: Three times a day (TID) | ORAL | Status: DC

## 2015-10-16 NOTE — Telephone Encounter (Signed)
Patient reports that only a 30 day supply was sent into the pharmacy. Patient is requesting a 90 day supply to be sent into the pharmacy. Patient uses [facility] in [place]. Thanks!

## 2015-10-22 ENCOUNTER — Other Ambulatory Visit: Payer: Self-pay | Admitting: Internal Medicine

## 2015-10-24 ENCOUNTER — Other Ambulatory Visit: Payer: Self-pay

## 2015-10-24 DIAGNOSIS — M797 Fibromyalgia: Secondary | ICD-10-CM

## 2015-10-24 MED ORDER — HYDROCODONE-ACETAMINOPHEN 5-325 MG PO TABS: 1.0000 | ORAL_TABLET | Freq: Three times a day (TID) | ORAL | Status: DC

## 2015-11-21 ENCOUNTER — Telehealth: Payer: Self-pay

## 2015-11-21 ENCOUNTER — Other Ambulatory Visit: Payer: Self-pay | Admitting: Internal Medicine

## 2015-11-21 DIAGNOSIS — M797 Fibromyalgia: Secondary | ICD-10-CM

## 2015-11-21 MED ORDER — HYDROCODONE-ACETAMINOPHEN 5-325 MG PO TABS: 1.0000 | ORAL_TABLET | Freq: Three times a day (TID) | ORAL | Status: DC

## 2015-11-21 NOTE — Telephone Encounter (Signed)
Patient requesting refill on Hydrocodone and will pick up this afternoon. I advised her to call front desk before coming.

## 2015-12-12 ENCOUNTER — Ambulatory Visit (INDEPENDENT_AMBULATORY_CARE_PROVIDER_SITE_OTHER): Payer: Medicare PPO | Admitting: Internal Medicine

## 2015-12-12 ENCOUNTER — Encounter: Payer: Self-pay | Admitting: Internal Medicine

## 2015-12-12 VITALS — BP 150/70 | HR 84 | Resp 16 | Ht 62.0 in | Wt 178.0 lb

## 2015-12-12 DIAGNOSIS — F324 Major depressive disorder, single episode, in partial remission: Secondary | ICD-10-CM | POA: Diagnosis not present

## 2015-12-12 DIAGNOSIS — G2581 Restless legs syndrome: Secondary | ICD-10-CM | POA: Diagnosis not present

## 2015-12-12 DIAGNOSIS — D0512 Intraductal carcinoma in situ of left breast: Secondary | ICD-10-CM

## 2015-12-12 DIAGNOSIS — M7661 Achilles tendinitis, right leg: Secondary | ICD-10-CM | POA: Diagnosis not present

## 2015-12-12 DIAGNOSIS — G43009 Migraine without aura, not intractable, without status migrainosus: Secondary | ICD-10-CM

## 2015-12-12 DIAGNOSIS — M797 Fibromyalgia: Secondary | ICD-10-CM

## 2015-12-12 DIAGNOSIS — D649 Anemia, unspecified: Secondary | ICD-10-CM | POA: Diagnosis not present

## 2015-12-12 MED ORDER — HYDROCODONE-ACETAMINOPHEN 5-325 MG PO TABS: 1.0000 | ORAL_TABLET | Freq: Three times a day (TID) | ORAL | Status: DC

## 2015-12-12 NOTE — Progress Notes (Signed)
Date:  12/12/2015   Name:  Sandra Mckinney   DOB:  [birth year]   MRN:  [id number]   Chief Complaint: Migraine and Anxiety Migraine  This is a recurrent problem. The current episode started more than 1 year ago. The problem occurs monthly. The problem has been gradually improving. The pain is located in the bilateral region. The pain quality is similar to prior headaches. Pertinent negatives include no coughing, dizziness, fever, numbness, seizures or weakness. The symptoms are aggravated by weather changes. She has tried NSAIDs for the symptoms.  Anxiety Presents for follow-up visit. Patient reports no chest pain, dizziness, nervous/anxious behavior, palpitations or shortness of breath. Symptoms occur occasionally.   Risk factors include recent illness. Her past medical history is significant for anxiety/panic attacks and fibromyalgia. Treatments tried: buspar. The treatment provided significant relief. Compliance with prior treatments has been good.  Restless Leg Syndrome - on requip with good control of symptoms.  She takes medication daily. Achilles tendonitis - started with anti-estrogen therapy along with hip and knee pain.  She had to stop the medication and pain resolved, except for ankle.  CBC w/ Differential - Final result (09/17/2015 9:20 AM) CBC w/ Differential - Final result (09/17/2015 9:20 AM)  Component Value Range  WBC 8.2 4.5-11.0 10*9/L  RBC 4.18 4.00-5.20 10*12/L  HGB 11.8 (L) 12.0-16.0 g/dL  HCT 37.8 36.0-46.0 %  MCV 90.5 80.0-100.0 fL  MCH 28.3 26.0-34.0 pg  MCHC 31.3 31.0-37.0 g/dL  RDW 14.5 12.0-15.0 %  MPV 6.5 (L) 7.0-10.0 fL  Platelet 214 150-440 10*9/L  Absolute Neutrophils 4.9 2.0-7.5 10*9/L  Absolute Lymphocytes 2.2 1.5-5.0 10*9/L  Absolute Monocytes 0.4 0.2-0.8 10*9/L  Absolute Eosinophils 0.4 0.0-0.4 10*9/L  Absolute Basophils 0.1 0.0-0.1 10*9/L  Large Unstained Cells 3 0-4 %  Hypochromasia Slight (A) Not Present   Comprehensive Metabolic  Panel - Final result (09/17/2015 9:20 AM) Comprehensive Metabolic Panel - Final result (09/17/2015 9:20 AM)  Component Value Range  Sodium 142 135-145 mmol/L  Potassium 4.2 3.5-5.0 mmol/L  Chloride 102 98-107 mmol/L  CO2 27.0 22.0-30.0 mmol/L  BUN 17 7-21 mg/dL  Creatinine 1.08 (H) 0.60-1.00 mg/dL  BUN/Creatinine Ratio 16   GFR MDRD Non Af Amer 51 (L) >=60 mL/min/1.48m2  GFR MDRD Af Amer 61 >=60 mL/min/1.31m2  Anion Gap 13 9-15 mmol/L  Glucose 123 65-179 mg/dL  Calcium 10.0 8.5-10.2 mg/dL  Albumin 4.0 3.5-5.0 g/dL  Total Protein 6.9 6.6-8.0 g/dL  Total Bilirubin 0.5 0.0-1.2 mg/dL  AST 27 14-38 U/L  ALT 25 15-48 U/L  Alkaline Phosphatase 74 38-126 U/L    Review of Systems  Constitutional: Negative for fever, chills and fatigue.  Respiratory: Negative for cough, shortness of breath and wheezing.   Cardiovascular: Negative for chest pain, palpitations and leg swelling.  Gastrointestinal: Negative for constipation.  Musculoskeletal: Positive for myalgias (right achilles) and arthralgias (restless leg syndrome). Negative for joint swelling.  Neurological: Negative for dizziness, tremors, seizures, syncope, weakness, numbness and headaches.  Psychiatric/Behavioral: Positive for dysphoric mood. Negative for sleep disturbance. The patient is not nervous/anxious.     Patient Active Problem List   Diagnosis Date Noted  . Anemia 12/12/2015  . Intraductal carcinoma of left breast 01/04/2015  . Neoplasm of left breast, primary tumor staging category Tis: ductal carcinoma in situ (DCIS) 01/03/2015  . Malignant neoplasm of upper-outer quadrant of female breast ([place]) 01/03/2015  . Neoplasm of right breast, primary tumor staging category Tis: ductal carcinoma in situ (DCIS) 12/27/2014  . Anxiety disorder 10/11/2014  .  Plantar fasciitis 10/11/2014  . Fibrositis 10/11/2014  . Local edema 10/11/2014  . Depression, major, single episode, in partial remission ([place]) 10/11/2014  . Hot flash,  menopausal 10/11/2014  . Migraine without aura and responsive to treatment 10/11/2014  . Idiopathic insomnia 10/11/2014  . Psoriasis 10/11/2014  . Restless leg 10/11/2014  . Avitaminosis D 10/11/2014    Prior to Admission medications   Medication Sig Start Date End Date Taking? Authorizing Provider  betamethasone dipropionate (DIPROLENE) 0.05 % cream Apply 1 application topically 2 (two) times daily.    Historical Provider, MD  busPIRone (BUSPAR) 10 MG tablet TAKE 1 TABLET FOUR TIMES DAILY 10/05/15   Glean Hess, MD  Calcium-Magnesium-Vitamin D [id number] MG-MG-UNIT TB24 Take 1 tablet by mouth daily.    Historical Provider, MD  cholecalciferol (VITAMIN D) 1000 UNITS tablet Take 1 tablet by mouth daily.    Historical Provider, MD  cloNIDine (CATAPRES) 0.1 MG tablet Take 1 tablet by mouth at bedtime. 03/02/15   Historical Provider, MD  furosemide (LASIX) 20 MG tablet TAKE 1 TABLET EVERY DAY 08/22/15   Glean Hess, MD  gabapentin (NEURONTIN) 100 MG capsule Take 1 capsule (100 mg total) by mouth 3 (three) times daily. 09/26/15   Glean Hess, MD  HYDROcodone-acetaminophen (NORCO/VICODIN) 5-325 MG tablet Take 1 tablet by mouth 3 (three) times daily. 11/21/15   Glean Hess, MD  metaxalone (SKELAXIN) 800 MG tablet Take 1 tablet (800 mg total) by mouth every 8 (eight) hours. 10/16/15   Glean Hess, MD  MULTIPLE VITAMINS-MINERALS PO Take 1 tablet by mouth daily.    Historical Provider, MD  propranolol ER (INDERAL LA) 60 MG 24 hr capsule TAKE 1 CAPSULE DAILY 10/22/15   Glean Hess, MD  rOPINIRole (REQUIP) 0.5 MG tablet Take 7 tablets (3.5 mg total) by mouth at bedtime. 06/11/15   Glean Hess, MD    Allergies  Allergen Reactions  . Cephalexin Hives  . Letrozole Swelling  . Raloxifene Hives  . Selective Estrogen Receptor Modulators Hives  . Cephalosporins   . Duloxetine Hcl   . Lyrica [Pregabalin]   . Oxycodone   . Penicillins   . Prilosec  [Omeprazole]   . Sulfa  Antibiotics     Past Surgical History  Procedure Laterality Date  . Carpal tunnel release Right 2014  . Breast surgery Bilateral breast reduction    2000  . Abdominal hysterectomy  1986    uterine fibroids and endometriosis  . Eye surgery Bilateral 2009    catarcts  . Thumb arthroscopy Right 2014  . Breast biopsy Right 06/01/12    [place] bx/clip-neg  . Reduction mammaplasty      2001  . Mastectomy, radical Bilateral 2016    Social History  Substance Use Topics  . Smoking status: Never Smoker   . Smokeless tobacco: Never Used  . Alcohol Use: No    Medication list has been reviewed and updated.  Physical Exam  Constitutional: She is oriented to person, place, and time. She appears well-developed. No distress.  HENT:  Head: Normocephalic and atraumatic.  Cardiovascular: Normal rate, regular rhythm and normal heart sounds.   Pulmonary/Chest: Effort normal and breath sounds normal. No respiratory distress.  Musculoskeletal: She exhibits edema (trace edema both ankles).  Tender right achilles  Neurological: She is alert and oriented to person, place, and time.  Skin: Skin is warm and dry. No rash noted.  Psychiatric: She has a normal mood and affect. Her speech is normal and  behavior is normal. Thought content normal.  Nursing note and vitals reviewed.   BP 150/70 mmHg  Pulse 84  Resp 16  Ht 5\' 2"  (1.575 m)  Wt 178 lb (80.74 kg)  BMI 32.55 kg/m2  SpO2 99%  Assessment and Plan: 1. Migraine without aura and responsive to treatment Stable; continue propranolol  2. Restless leg controlled  3. Depression, major, single episode, in partial remission ([place]) stable  4. Intraductal carcinoma of left breast Followed by Oncology  5. Anemia, unspecified anemia type Modest decrease in Hgb noted on labs from March at [place] discussing with specialist at visit next month Will not draw labs as these are done at [facility]  6. Tendonitis, Achilles, right Ibuprofen 800 mg  tid Ice for 20 minutes tid  7. Fibrositis Stable symptoms - HYDROcodone-acetaminophen (NORCO/VICODIN) 5-325 MG tablet; Take 1 tablet by mouth 3 (three) times daily.  Dispense: 90 tablet; Refill: 0   Halina Maidens, MD [facility]  12/12/2015

## 2015-12-12 NOTE — Patient Instructions (Signed)
Achilles Tendinitis Achilles tendinitis is inflammation of the tough, cord-like band that attaches the lower muscles of your leg to your heel (Achilles tendon). It is usually caused by overusing the tendon and joint involved.  CAUSES Achilles tendinitis can happen because of:  A sudden increase in exercise or activity (such as running).  Doing the same exercises or activities (such as jumping) over and over.  Not warming up calf muscles before exercising.  Exercising in shoes that are worn out or not made for exercise.  Having arthritis or a bone growth on the back of the heel bone. This can rub against the tendon and hurt the tendon. SIGNS AND SYMPTOMS The most common symptoms are:  Pain in the back of the leg, just above the heel. The pain usually gets worse with exercise and better with rest.  Stiffness or soreness in the back of the leg, especially in the morning.  Swelling of the skin over the Achilles tendon.  Trouble standing on tiptoe. Sometimes, an Achilles tendon tears (ruptures). Symptoms of an Achilles tendon rupture can include:  Sudden, severe pain in the back of the leg.  Trouble putting weight on the foot or walking normally. DIAGNOSIS Achilles tendinitis will be diagnosed based on symptoms and a physical examination. An X-ray may be done to check if another condition is causing your symptoms. An MRI may be ordered if your health care provider suspects you may have completely torn your tendon, which is called an Achilles tendon rupture.  TREATMENT  Achilles tendinitis usually gets better over time. It can take weeks to months to heal completely. Treatment focuses on treating the symptoms and helping the injury heal. HOME CARE INSTRUCTIONS   Rest your Achilles tendon and avoid activities that cause pain.  Apply ice to the injured area:  Put ice in a plastic bag.  Place a towel between your skin and the bag.  Leave the ice on for 20 minutes, 2-3 times a  day  Try to avoid using the tendon (other than gentle range of motion) while the tendon is painful. Do not resume use until instructed by your health care provider. Then begin use gradually. Do not increase use to the point of pain. If pain does develop, decrease use and continue the above measures. Gradually increase activities that do not cause discomfort until you achieve normal use.  Do exercises to make your calf muscles stronger and more flexible. Your health care provider or physical therapist can recommend exercises for you to do.  Wrap your ankle with an elastic bandage or other wrap. This can help keep your tendon from moving too much. Your health care provider will show you how to wrap your ankle correctly.  Only take over-the-counter or prescription medicines for pain, discomfort, or fever as directed by your health care provider. SEEK MEDICAL CARE IF:   Your pain and swelling increase or pain is uncontrolled with medicines.  You develop new, unexplained symptoms or your symptoms get worse.  You are unable to move your toes or foot.  You develop warmth and swelling in your foot.  You have an unexplained temperature. MAKE SURE YOU:   Understand these instructions.  Will watch your condition.  Will get help right away if you are not doing well or get worse.   This information is not intended to replace advice given to you by your health care provider. Make sure you discuss any questions you have with your health care provider.   Document Released:   04/02/2005 Document Revised: 07/14/2014 Document Reviewed: 02/02/2013 Elsevier Interactive Patient Education 2016 [employer]  

## 2015-12-17 ENCOUNTER — Ambulatory Visit (INDEPENDENT_AMBULATORY_CARE_PROVIDER_SITE_OTHER): Payer: Medicare PPO | Admitting: Internal Medicine

## 2015-12-17 ENCOUNTER — Encounter: Payer: Self-pay | Admitting: Internal Medicine

## 2015-12-17 VITALS — BP 118/62 | HR 60 | Resp 16 | Ht 62.0 in | Wt 178.0 lb

## 2015-12-17 DIAGNOSIS — D485 Neoplasm of uncertain behavior of skin: Secondary | ICD-10-CM | POA: Diagnosis not present

## 2015-12-17 DIAGNOSIS — J0100 Acute maxillary sinusitis, unspecified: Secondary | ICD-10-CM | POA: Diagnosis not present

## 2015-12-17 DIAGNOSIS — Z872 Personal history of diseases of the skin and subcutaneous tissue: Secondary | ICD-10-CM | POA: Diagnosis not present

## 2015-12-17 DIAGNOSIS — Z1283 Encounter for screening for malignant neoplasm of skin: Secondary | ICD-10-CM | POA: Diagnosis not present

## 2015-12-17 DIAGNOSIS — L918 Other hypertrophic disorders of the skin: Secondary | ICD-10-CM | POA: Diagnosis not present

## 2015-12-17 MED ORDER — LEVOFLOXACIN 500 MG PO TABS: 500.0000 mg | ORAL_TABLET | Freq: Every day | ORAL | Status: DC

## 2015-12-17 MED ORDER — METHYLPREDNISOLONE 4 MG PO TBPK: ORAL_TABLET | ORAL | Status: DC

## 2015-12-17 NOTE — Progress Notes (Signed)
Date:  12/17/2015   Name:  Sandra Mckinney   DOB:  10-16-1945   MRN:  [id number]   Chief Complaint: Facial Pain Sinus Problem This is a new problem. The current episode started in the past 7 days. The problem is unchanged. There has been no fever. Associated symptoms include congestion and sinus pressure. Pertinent negatives include no chills, ear pain, headaches, shortness of breath or sore throat. (Tooth pressure)  She has been using Flonase for the past couple of days as well as an antihistamine. Both of these have helped to some degree. She still has sharp stabbing pains in the right maxillary sinus and she is concerned is going to trigger a migraine headache. She denies any purulent drainage. She denies sore throat or fever.    Review of Systems  Constitutional: Negative for fever, chills and fatigue.  HENT: Positive for congestion, dental problem (tooth pain on upper right) and sinus pressure. Negative for ear pain, sore throat and trouble swallowing.   Eyes: Negative for visual disturbance.  Respiratory: Negative for chest tightness and shortness of breath.   Cardiovascular: Negative for chest pain.  Neurological: Negative for dizziness and headaches.    Patient Active Problem List   Diagnosis Date Noted  . Anemia 12/12/2015  . Intraductal carcinoma of left breast 01/04/2015  . Neoplasm of left breast, primary tumor staging category Tis: ductal carcinoma in situ (DCIS) 01/03/2015  . Malignant neoplasm of upper-outer quadrant of female breast ([place]) 01/03/2015  . Neoplasm of right breast, primary tumor staging category Tis: ductal carcinoma in situ (DCIS) 12/27/2014  . Anxiety disorder 10/11/2014  . Plantar fasciitis 10/11/2014  . Fibrositis 10/11/2014  . Local edema 10/11/2014  . Depression, major, single episode, in partial remission ([place]) 10/11/2014  . Hot flash, menopausal 10/11/2014  . Migraine without aura and responsive to treatment 10/11/2014  .  Idiopathic insomnia 10/11/2014  . Psoriasis 10/11/2014  . Restless leg 10/11/2014  . Avitaminosis D 10/11/2014    Prior to Admission medications   Medication Sig Start Date End Date Taking? Authorizing Provider  betamethasone dipropionate (DIPROLENE) 0.05 % cream Apply 1 application topically 2 (two) times daily.   Yes Historical Provider, MD  busPIRone (BUSPAR) 10 MG tablet TAKE 1 TABLET FOUR TIMES DAILY 10/05/15  Yes Glean Hess, MD  Calcium-Magnesium-Vitamin D [id number] MG-MG-UNIT TB24 Take 1 tablet by mouth daily.   Yes Historical Provider, MD  cholecalciferol (VITAMIN D) 1000 UNITS tablet Take 1 tablet by mouth daily.   Yes Historical Provider, MD  cloNIDine (CATAPRES) 0.1 MG tablet Take 1 tablet by mouth at bedtime. 03/02/15  Yes Historical Provider, MD  furosemide (LASIX) 20 MG tablet TAKE 1 TABLET EVERY DAY 08/22/15  Yes Glean Hess, MD  gabapentin (NEURONTIN) 100 MG capsule Take 1 capsule (100 mg total) by mouth 3 (three) times daily. 09/26/15  Yes Glean Hess, MD  HYDROcodone-acetaminophen (NORCO/VICODIN) 5-325 MG tablet Take 1 tablet by mouth 3 (three) times daily. 12/12/15  Yes Glean Hess, MD  ibandronate (BONIVA) 150 MG tablet Take 150 mg by mouth every 30 (thirty) days. Take in the morning with a full glass of water, on an empty stomach, and do not take anything else by mouth or lie down for the next 30 min.   Yes Historical Provider, MD  metaxalone (SKELAXIN) 800 MG tablet Take 1 tablet (800 mg total) by mouth every 8 (eight) hours. 10/16/15  Yes Glean Hess, MD  MULTIPLE VITAMINS-MINERALS PO Take 1  tablet by mouth daily.   Yes Historical Provider, MD  propranolol ER (INDERAL LA) 60 MG 24 hr capsule TAKE 1 CAPSULE DAILY 10/22/15  Yes Glean Hess, MD  rOPINIRole (REQUIP) 0.5 MG tablet Take 7 tablets (3.5 mg total) by mouth at bedtime. 06/11/15  Yes Glean Hess, MD    Allergies  Allergen Reactions  . Cephalexin Hives  . Letrozole Swelling  .  Raloxifene Hives  . Selective Estrogen Receptor Modulators Hives  . Cephalosporins   . Duloxetine Hcl   . Lyrica [Pregabalin]   . Oxycodone   . Penicillins   . Prilosec  [Omeprazole]   . Sulfa Antibiotics     Past Surgical History  Procedure Laterality Date  . Carpal tunnel release Right 2014  . Breast surgery Bilateral breast reduction    2000  . Abdominal hysterectomy  1986    uterine fibroids and endometriosis  . Eye surgery Bilateral 2009    catarcts  . Thumb arthroscopy Right 2014  . Breast biopsy Right 06/01/12    [place] bx/clip-neg  . Reduction mammaplasty      2001  . Mastectomy, radical Bilateral 2016    Social History  Substance Use Topics  . Smoking status: Never Smoker   . Smokeless tobacco: Never Used  . Alcohol Use: No     Medication list has been reviewed and updated.   Physical Exam  Constitutional: She is oriented to person, place, and time. She appears well-developed. No distress.  HENT:  Head: Normocephalic and atraumatic.  Right Ear: Tympanic membrane and ear canal normal.  Left Ear: Tympanic membrane and ear canal normal.  Nose: Right sinus exhibits maxillary sinus tenderness. Right sinus exhibits no frontal sinus tenderness. Left sinus exhibits no maxillary sinus tenderness and no frontal sinus tenderness.  Mouth/Throat: Uvula is midline and oropharynx is clear and moist.  Neck: Normal range of motion. Neck supple.  Cardiovascular: Normal rate, regular rhythm and normal heart sounds.   Pulmonary/Chest: Effort normal and breath sounds normal. No respiratory distress.  Musculoskeletal: Normal range of motion.  Lymphadenopathy:    She has no cervical adenopathy.  Neurological: She is alert and oriented to person, place, and time.  Skin: Skin is warm and dry. No rash noted.  Psychiatric: She has a normal mood and affect. Her behavior is normal. Thought content normal.  Nursing note and vitals reviewed.   BP 118/62 mmHg  Pulse 60  Resp 16  Ht  5\' 2"  (1.575 m)  Wt 178 lb (80.74 kg)  BMI 32.55 kg/m2  SpO2 98%  Assessment and Plan: 1. Acute maxillary sinusitis, recurrence not specified Continue flonase and antihistamines Add prednisone taper Take levaquin only if s/s of infection occur - methylPREDNISolone (MEDROL DOSEPAK) 4 MG TBPK tablet; Take 6 pills on day 1 the 5 pills day 2 then 4 pills day 3 then 3 pills day 4 then 2 pills day 5 then one pills day 6 then stop  Dispense: 21 tablet; Refill: 0 - levofloxacin (LEVAQUIN) 500 MG tablet; Take 1 tablet (500 mg total) by mouth daily.  Dispense: 7 tablet; Refill: 0   Halina Maidens, MD [facility]  12/17/2015

## 2016-01-09 DIAGNOSIS — D2271 Melanocytic nevi of right lower limb, including hip: Secondary | ICD-10-CM | POA: Diagnosis not present

## 2016-01-09 DIAGNOSIS — D485 Neoplasm of uncertain behavior of skin: Secondary | ICD-10-CM | POA: Diagnosis not present

## 2016-01-11 ENCOUNTER — Telehealth: Payer: Self-pay

## 2016-01-11 ENCOUNTER — Other Ambulatory Visit: Payer: Self-pay | Admitting: Internal Medicine

## 2016-01-11 DIAGNOSIS — M797 Fibromyalgia: Secondary | ICD-10-CM

## 2016-01-11 MED ORDER — HYDROCODONE-ACETAMINOPHEN 5-325 MG PO TABS: 1.0000 | ORAL_TABLET | Freq: Three times a day (TID) | ORAL | Status: DC

## 2016-01-11 NOTE — Telephone Encounter (Signed)
Patient left message to get Hydrocodone filled. Wants to pick up today.

## 2016-01-14 DIAGNOSIS — C50912 Malignant neoplasm of unspecified site of left female breast: Secondary | ICD-10-CM | POA: Diagnosis not present

## 2016-01-14 DIAGNOSIS — Z90710 Acquired absence of both cervix and uterus: Secondary | ICD-10-CM | POA: Diagnosis not present

## 2016-01-14 DIAGNOSIS — C50412 Malignant neoplasm of upper-outer quadrant of left female breast: Secondary | ICD-10-CM | POA: Diagnosis not present

## 2016-01-14 DIAGNOSIS — Z9013 Acquired absence of bilateral breasts and nipples: Secondary | ICD-10-CM | POA: Diagnosis not present

## 2016-01-14 DIAGNOSIS — M797 Fibromyalgia: Secondary | ICD-10-CM | POA: Diagnosis not present

## 2016-01-14 DIAGNOSIS — C50911 Malignant neoplasm of unspecified site of right female breast: Secondary | ICD-10-CM | POA: Diagnosis not present

## 2016-01-14 DIAGNOSIS — Z170 Estrogen receptor positive status [ER+]: Secondary | ICD-10-CM | POA: Diagnosis not present

## 2016-01-15 DIAGNOSIS — N651 Disproportion of reconstructed breast: Secondary | ICD-10-CM | POA: Diagnosis not present

## 2016-01-18 DIAGNOSIS — D485 Neoplasm of uncertain behavior of skin: Secondary | ICD-10-CM | POA: Diagnosis not present

## 2016-01-18 DIAGNOSIS — D2271 Melanocytic nevi of right lower limb, including hip: Secondary | ICD-10-CM | POA: Diagnosis not present

## 2016-01-25 DIAGNOSIS — D649 Anemia, unspecified: Secondary | ICD-10-CM | POA: Diagnosis not present

## 2016-01-25 DIAGNOSIS — C50412 Malignant neoplasm of upper-outer quadrant of left female breast: Secondary | ICD-10-CM | POA: Diagnosis not present

## 2016-01-31 ENCOUNTER — Other Ambulatory Visit: Payer: Self-pay | Admitting: Internal Medicine

## 2016-01-31 DIAGNOSIS — M797 Fibromyalgia: Secondary | ICD-10-CM

## 2016-01-31 MED ORDER — HYDROCODONE-ACETAMINOPHEN 5-325 MG PO TABS: 1.0000 | ORAL_TABLET | Freq: Three times a day (TID) | ORAL | 0 refills | Status: DC

## 2016-02-12 ENCOUNTER — Other Ambulatory Visit: Payer: Self-pay | Admitting: Internal Medicine

## 2016-03-05 ENCOUNTER — Other Ambulatory Visit: Payer: Self-pay | Admitting: Internal Medicine

## 2016-03-05 DIAGNOSIS — M797 Fibromyalgia: Secondary | ICD-10-CM

## 2016-03-05 MED ORDER — HYDROCODONE-ACETAMINOPHEN 5-325 MG PO TABS: 1.0000 | ORAL_TABLET | Freq: Three times a day (TID) | ORAL | 0 refills | Status: DC

## 2016-03-18 DIAGNOSIS — D485 Neoplasm of uncertain behavior of skin: Secondary | ICD-10-CM | POA: Diagnosis not present

## 2016-04-07 ENCOUNTER — Other Ambulatory Visit: Payer: Self-pay | Admitting: Internal Medicine

## 2016-04-07 DIAGNOSIS — M797 Fibromyalgia: Secondary | ICD-10-CM

## 2016-04-07 MED ORDER — HYDROCODONE-ACETAMINOPHEN 5-325 MG PO TABS: 1.0000 | ORAL_TABLET | Freq: Three times a day (TID) | ORAL | 0 refills | Status: DC

## 2016-04-08 DIAGNOSIS — D2271 Melanocytic nevi of right lower limb, including hip: Secondary | ICD-10-CM | POA: Diagnosis not present

## 2016-04-08 DIAGNOSIS — D485 Neoplasm of uncertain behavior of skin: Secondary | ICD-10-CM | POA: Diagnosis not present

## 2016-04-08 DIAGNOSIS — D225 Melanocytic nevi of trunk: Secondary | ICD-10-CM | POA: Diagnosis not present

## 2016-04-15 DIAGNOSIS — D485 Neoplasm of uncertain behavior of skin: Secondary | ICD-10-CM | POA: Diagnosis not present

## 2016-04-15 DIAGNOSIS — D225 Melanocytic nevi of trunk: Secondary | ICD-10-CM | POA: Diagnosis not present

## 2016-04-16 ENCOUNTER — Encounter: Payer: Self-pay | Admitting: Internal Medicine

## 2016-04-16 ENCOUNTER — Ambulatory Visit (INDEPENDENT_AMBULATORY_CARE_PROVIDER_SITE_OTHER): Payer: Medicare PPO | Admitting: Internal Medicine

## 2016-04-16 VITALS — BP 122/84 | HR 60 | Resp 16 | Ht 62.0 in | Wt 177.2 lb

## 2016-04-16 DIAGNOSIS — F324 Major depressive disorder, single episode, in partial remission: Secondary | ICD-10-CM

## 2016-04-16 DIAGNOSIS — R600 Localized edema: Secondary | ICD-10-CM

## 2016-04-16 DIAGNOSIS — Z79891 Long term (current) use of opiate analgesic: Secondary | ICD-10-CM

## 2016-04-16 DIAGNOSIS — D0512 Intraductal carcinoma in situ of left breast: Secondary | ICD-10-CM

## 2016-04-16 DIAGNOSIS — M797 Fibromyalgia: Secondary | ICD-10-CM | POA: Diagnosis not present

## 2016-04-16 DIAGNOSIS — G2581 Restless legs syndrome: Secondary | ICD-10-CM

## 2016-04-16 DIAGNOSIS — E559 Vitamin D deficiency, unspecified: Secondary | ICD-10-CM

## 2016-04-16 DIAGNOSIS — Z0000 Encounter for general adult medical examination without abnormal findings: Secondary | ICD-10-CM

## 2016-04-16 DIAGNOSIS — M810 Age-related osteoporosis without current pathological fracture: Secondary | ICD-10-CM | POA: Insufficient documentation

## 2016-04-16 LAB — POCT URINALYSIS DIPSTICK
Bilirubin, UA: NEGATIVE
Blood, UA: NEGATIVE
Glucose, UA: NEGATIVE
KETONES UA: NEGATIVE
Leukocytes, UA: NEGATIVE
Nitrite, UA: NEGATIVE
PH UA: 5
PROTEIN UA: NEGATIVE
SPEC GRAV UA: 1.015
Urobilinogen, UA: 0.2

## 2016-04-16 NOTE — Progress Notes (Signed)
Patient: Sandra Mckinney, Female    DOB: 10-10-1945, 70 y.o.   MRN: [id number] Visit Date: 04/16/2016  Today's Provider: Halina Maidens, MD   Chief Complaint  Patient presents with  . Medicare Wellness   Subjective:    Annual wellness visit Sandra Mckinney is a 70 y.o. female who presents today for her Subsequent Annual Wellness Visit. She feels well. She reports exercising some. She reports she is sleeping fairly well.   ----------------------------------------------------------- Depression         This is a chronic problem.  The problem occurs rarely.  The problem has been resolved since onset.  Associated symptoms include myalgias.  Associated symptoms include no fatigue and no headaches.  Past treatments include other medications.  Compliance with treatment is good. Fibromyalgia - continues on long term hydrocodone and gabapentin. [place] query is consistent.  She is aware of the new regulations and will have a urine screen today. RLS - stable and fairly well controlled on Requip. Her sleep is sometimes disrupted but not due to leg sx. Edema - of both lower legs is stable.  She takes lasix daily which controls sx adequately.  She avoids sodium if possible. Breast cancer - had positive bx on both breasts - underwent bilateral mastectomies and reconstruction. She feels that she is doing well.  Oncology also started her on iron supplements and she had a DEXA last fall which was normal.  Review of Systems  Constitutional: Negative for chills, fatigue and fever.  HENT: Negative for congestion, hearing loss, tinnitus, trouble swallowing and voice change.   Eyes: Negative for visual disturbance.  Respiratory: Negative for cough, chest tightness, shortness of breath and wheezing.   Cardiovascular: Negative for chest pain, palpitations and leg swelling.  Gastrointestinal: Negative for abdominal pain, constipation, diarrhea and vomiting.  Endocrine: Negative for  polydipsia and polyuria.  Genitourinary: Negative for dysuria, frequency, genital sores, vaginal bleeding and vaginal discharge.  Musculoskeletal: Positive for arthralgias and myalgias. Negative for gait problem and joint swelling.       Also new right heel pain  Skin: Positive for wound (pre-melanoma biopsy site right buttock). Negative for color change and rash.  Neurological: Negative for dizziness, tremors, light-headedness and headaches.  Hematological: Negative for adenopathy. Does not bruise/bleed easily.  Psychiatric/Behavioral: Positive for depression. Negative for dysphoric mood and sleep disturbance. The patient is not nervous/anxious.     Social History   Social History  . Marital status: Married    Spouse name: N/A  . Number of children: N/A  . Years of education: N/A   Occupational History  . Not on file.   Social History Main Topics  . Smoking status: Never Smoker  . Smokeless tobacco: Never Used  . Alcohol use No  . Drug use: No  . Sexual activity: Not on file   Other Topics Concern  . Not on file   Social History Narrative  . No narrative on file    Patient Active Problem List   Diagnosis Date Noted  . Osteoporosis 04/16/2016  . Age-related osteoporosis without current pathological fracture 04/16/2016  . Anemia 12/12/2015  . Neoplasm of left breast, primary tumor staging category Tis: ductal carcinoma in situ (DCIS) 01/03/2015  . Neoplasm of right breast, primary tumor staging category Tis: ductal carcinoma in situ (DCIS) 12/27/2014  . Anxiety disorder 10/11/2014  . Plantar fasciitis 10/11/2014  . Fibrositis 10/11/2014  . Local edema 10/11/2014  . Depression, major, single episode, in partial remission ([place]) 10/11/2014  .  Hot flash, menopausal 10/11/2014  . Migraine without aura and responsive to treatment 10/11/2014  . Idiopathic insomnia 10/11/2014  . Psoriasis 10/11/2014  . Restless leg 10/11/2014  . Avitaminosis D 10/11/2014    Past Surgical  History:  Procedure Laterality Date  . ABDOMINAL HYSTERECTOMY  1986   uterine fibroids and endometriosis  . BREAST BIOPSY Right 06/01/12   [place] bx/clip-neg  . BREAST RECONSTRUCTION Bilateral 2017  . CARPAL TUNNEL RELEASE Right 2014  . EYE SURGERY Bilateral 2009   catarcts  . MASTECTOMY, RADICAL Bilateral 2016  . REDUCTION MAMMAPLASTY     2001  . THUMB ARTHROSCOPY Right 2014    Her family history includes Dementia in her mother; Heart disease (age of onset: 6) in her father; Hypertension in her mother.    Previous Medications   BETAMETHASONE DIPROPIONATE (DIPROLENE) 0.05 % CREAM    Apply 1 application topically 2 (two) times daily.   BUSPIRONE (BUSPAR) 10 MG TABLET    TAKE 1 TABLET FOUR TIMES DAILY   CALCIUM-MAGNESIUM-VITAMIN D 600-40-500 MG-MG-UNIT TB24    Take 1 tablet by mouth daily.   CHOLECALCIFEROL (VITAMIN D) 1000 UNITS TABLET    Take 1 tablet by mouth daily.   CLONIDINE (CATAPRES) 0.1 MG TABLET    Take 1 tablet by mouth at bedtime.   FUROSEMIDE (LASIX) 20 MG TABLET    TAKE 1 TABLET EVERY DAY   GABAPENTIN (NEURONTIN) 100 MG CAPSULE    TAKE 1 CAPSULE THREE TIMES DAILY   HYDROCODONE-ACETAMINOPHEN (NORCO/VICODIN) 5-325 MG TABLET    Take 1 tablet by mouth 3 (three) times daily.   METAXALONE (SKELAXIN) 800 MG TABLET    Take 1 tablet (800 mg total) by mouth every 8 (eight) hours.   MULTIPLE VITAMINS-MINERALS PO    Take 1 tablet by mouth daily.   PROPRANOLOL ER (INDERAL LA) 60 MG 24 HR CAPSULE    TAKE 1 CAPSULE DAILY   ROPINIROLE (REQUIP) 0.5 MG TABLET    Take 7 tablets (3.5 mg total) by mouth at bedtime.    Patient Care Team: Glean Hess, MD as PCP - General (Family Medicine)     Objective:   Vitals: BP 122/84 (BP Location: Right Leg) Comment: Can Not Do BP  Pulse 60   Resp 16   Ht 5\' 2"  (1.575 m)   Wt 177 lb 3.2 oz (80.4 kg)   SpO2 100%   BMI 32.41 kg/m   Physical Exam  Constitutional: She is oriented to person, place, and time. She appears well-developed and  well-nourished. No distress.  HENT:  Head: Normocephalic and atraumatic.  Right Ear: Tympanic membrane and ear canal normal.  Left Ear: Tympanic membrane and ear canal normal.  Nose: Right sinus exhibits no maxillary sinus tenderness. Left sinus exhibits no maxillary sinus tenderness.  Mouth/Throat: Uvula is midline and oropharynx is clear and moist.  Eyes: Conjunctivae and EOM are normal. Right eye exhibits no discharge. Left eye exhibits no discharge. No scleral icterus.  Neck: Normal range of motion. Carotid bruit is not present. No erythema present. No thyromegaly present.  Cardiovascular: Normal rate, regular rhythm, normal heart sounds and normal pulses.   Pulmonary/Chest: Effort normal. No respiratory distress. She has no wheezes.  Abdominal: Soft. Bowel sounds are normal. There is no hepatosplenomegaly. There is no tenderness. There is no CVA tenderness.  Musculoskeletal: Normal range of motion. She exhibits edema and tenderness.  Lymphadenopathy:    She has no cervical adenopathy.    She has no axillary adenopathy.  Neurological:  She is alert and oriented to person, place, and time. She has normal reflexes. No cranial nerve deficit or sensory deficit.  Skin: Skin is warm, dry and intact. No rash noted.  Psychiatric: She has a normal mood and affect. Her speech is normal and behavior is normal. Thought content normal.  Nursing note and vitals reviewed.   Activities of Daily Living In your present state of health, do you have any difficulty performing the following activities: 04/16/2016 12/12/2015  Hearing? N N  Vision? N N  Difficulty concentrating or making decisions? N N  Walking or climbing stairs? N N  Dressing or bathing? N N  Doing errands, shopping? N N  Preparing Food and eating ? N -  Using the Toilet? N -  In the past six months, have you accidently leaked urine? N -  Do you have problems with loss of bowel control? N -  Managing your Medications? N -  Managing  your Finances? N -  Housekeeping or managing your Housekeeping? N -  Some recent data might be hidden    Fall Risk Assessment Fall Risk  04/16/2016 12/12/2015 12/06/2014  Falls in the past year? No Yes No  Number falls in past yr: - 2 or more -  Injury with Fall? - Yes -  Risk for fall due to : - Other (Comment) -      Depression Screen PHQ 2/9 Scores 04/16/2016 12/12/2015 12/06/2014  PHQ - 2 Score 0 0 0    Cognitive Testing - 6-CIT   Correct? Score   What year is it? yes 0 Yes = 0    No = 4  What month is it? yes 0 Yes = 0    No = 3  Remember:     Pia [address], [place], [place]     What time is it? yes 0 Yes = 0    No = 3  Count backwards from 20 to 1 yes 0 Correct = 0    1 error = 2   More than 1 error = 4  Say the months of the year in reverse. yes 0 Correct = 0    1 error = 2   More than 1 error = 4  What address did I ask you to remember? yes 0 Correct = 0  1 error = 2    2 error = 4    3 error = 6    4 error = 8    All wrong = 10       TOTAL SCORE  0/28   Interpretation:  Normal  Normal (0-7) Abnormal (8-28)      Medicare Annual Wellness Visit Summary:  Reviewed patient's Family Medical History Reviewed and updated list of patient's medical providers Assessment of cognitive impairment was done Assessed patient's functional ability Established a written schedule for health screening [place] Completed and Reviewed  Exercise Activities and Dietary recommendations Goals    . Happiness          Continue to be positive and keep family and herself happy while keeping grand kids and staying active.        Immunization History  Administered Date(s) Administered  . Influenza-Unspecified 03/07/2014, 03/07/2016  . Pneumococcal Conjugate-13 03/14/2014, 09/17/2015  . Pneumococcal Polysaccharide-23 04/20/2002, 11/07/2010  . Tdap 11/24/2011  . Zoster 10/09/2014    Health Maintenance  Topic Date Due  . DEXA SCAN  12/09/2012  . PNA vac Low  Risk Adult (  2 of 2 - PPSV23) 09/16/2016  . MAMMOGRAM  12/20/2016  . TETANUS/TDAP  11/23/2021  . COLONOSCOPY  10/10/2024  . INFLUENZA VACCINE  Addressed  . ZOSTAVAX  Completed  . Hepatitis C Screening  Addressed     Discussed health benefits of physical activity, and encouraged her to engage in regular exercise appropriate for her age and condition.    ------------------------------------------------------------------------------------------------------------   Assessment & Plan:  1. Medicare annual wellness visit, subsequent Measures satisfied - Lipid panel - POCT urinalysis dipstick  2. Fibrositis Stable on long term hydrocodone and gabapentin No evidence of misuse Home safety measures in place  3. Restless leg stable  4. Intraductal carcinoma of left breast Doing well s/p surgery and reconstruction  5. Local edema Mild, stable - TSH  6. Avitaminosis D Continue supplements - VITAMIN D 25 Hydroxy (Vit-D Deficiency, Fractures)  7. Depression, major, single episode, in partial remission ([place]) Controlled on Buspar  8. Long term (current) use of opiate analgesic - Drug Screen, Urine   Halina Maidens, MD [facility]  04/16/2016

## 2016-04-16 NOTE — Patient Instructions (Signed)
Health Maintenance  Topic Date Due  . PNA vac Low Risk Adult (2 of 2 - PPSV23) 09/16/2016  . MAMMOGRAM  12/20/2016  . TETANUS/TDAP  11/23/2021  . COLONOSCOPY  10/10/2024  . INFLUENZA VACCINE  Addressed  . DEXA SCAN  Addressed  . ZOSTAVAX  Completed  . Hepatitis C Screening  Addressed    Dr Samara Deist [facility] Podiatry

## 2016-04-17 LAB — LIPID PANEL
Chol/HDL Ratio: 2.3 ratio units (ref 0.0–4.4)
Cholesterol, Total: 208 mg/dL — ABNORMAL HIGH (ref 100–199)
HDL: 92 mg/dL (ref >39)
LDL CALC: 87 mg/dL (ref 0–99)
Triglycerides: 145 mg/dL (ref 0–149)
VLDL CHOLESTEROL CAL: 29 mg/dL (ref 5–40)

## 2016-04-17 LAB — DRUG SCREEN, URINE
Amphetamines, Urine: NEGATIVE ng/mL
BENZODIAZEPINE QUANT UR: NEGATIVE ng/mL
Barbiturate screen, urine: NEGATIVE ng/mL
Cannabinoid Quant, Ur: NEGATIVE ng/mL
Cocaine (Metab.): NEGATIVE ng/mL
OPIATE QUANT UR: POSITIVE ng/mL
PCP QUANT UR: NEGATIVE ng/mL

## 2016-04-17 LAB — VITAMIN D 25 HYDROXY (VIT D DEFICIENCY, FRACTURES): Vit D, 25-Hydroxy: 43.3 ng/mL (ref 30.0–100.0)

## 2016-04-17 LAB — TSH: TSH: 0.841 u[IU]/mL (ref 0.450–4.500)

## 2016-04-23 ENCOUNTER — Other Ambulatory Visit: Payer: Self-pay | Admitting: Internal Medicine

## 2016-04-23 DIAGNOSIS — G2581 Restless legs syndrome: Secondary | ICD-10-CM

## 2016-04-30 DIAGNOSIS — M7661 Achilles tendinitis, right leg: Secondary | ICD-10-CM | POA: Diagnosis not present

## 2016-04-30 DIAGNOSIS — M79671 Pain in right foot: Secondary | ICD-10-CM | POA: Diagnosis not present

## 2016-05-08 ENCOUNTER — Other Ambulatory Visit: Payer: Self-pay | Admitting: Internal Medicine

## 2016-05-08 DIAGNOSIS — M797 Fibromyalgia: Secondary | ICD-10-CM

## 2016-05-08 MED ORDER — HYDROCODONE-ACETAMINOPHEN 5-325 MG PO TABS: 1.0000 | ORAL_TABLET | Freq: Three times a day (TID) | ORAL | 0 refills | Status: DC

## 2016-05-12 ENCOUNTER — Ambulatory Visit (INDEPENDENT_AMBULATORY_CARE_PROVIDER_SITE_OTHER): Payer: Medicare PPO | Admitting: Internal Medicine

## 2016-05-12 ENCOUNTER — Encounter: Payer: Self-pay | Admitting: Internal Medicine

## 2016-05-12 VITALS — HR 60 | Temp 98.1°F | Resp 16 | Ht 62.0 in | Wt 177.0 lb

## 2016-05-12 DIAGNOSIS — J40 Bronchitis, not specified as acute or chronic: Secondary | ICD-10-CM

## 2016-05-12 MED ORDER — METAXALONE 800 MG PO TABS: 800.0000 mg | ORAL_TABLET | Freq: Three times a day (TID) | ORAL | 3 refills | Status: DC

## 2016-05-12 MED ORDER — HYDROCODONE-HOMATROPINE 5-1.5 MG/5ML PO SYRP: 5.0000 mL | ORAL_SOLUTION | Freq: Four times a day (QID) | ORAL | 0 refills | Status: DC | PRN

## 2016-05-12 MED ORDER — ALBUTEROL SULFATE HFA 108 (90 BASE) MCG/ACT IN AERS: 2.0000 | INHALATION_SPRAY | Freq: Four times a day (QID) | RESPIRATORY_TRACT | 2 refills | Status: DC | PRN

## 2016-05-12 MED ORDER — LEVOFLOXACIN 500 MG PO TABS: 500.0000 mg | ORAL_TABLET | Freq: Every day | ORAL | 0 refills | Status: DC

## 2016-05-12 NOTE — Progress Notes (Signed)
Date:  05/12/2016   Name:  Sandra Mckinney   DOB:  [birth year]   MRN:  [id number]   Chief Complaint: Cough (wheezing some and deep cough...1 month after exposed to HFM from grandkids. Aching all over from coughing. ) Cough  This is a new problem. The current episode started in the past 7 days. The problem has been unchanged. The problem occurs every few minutes. The cough is productive of purulent sputum. Associated symptoms include nasal congestion, a sore throat, shortness of breath and wheezing. Pertinent negatives include no chest pain, chills or fever. She has tried OTC cough suppressant and cool air for the symptoms.     Review of Systems  Constitutional: Positive for fatigue. Negative for chills, diaphoresis and fever.  HENT: Positive for sinus pain, sinus pressure and sore throat. Negative for trouble swallowing and voice change.   Respiratory: Positive for cough, shortness of breath and wheezing. Negative for chest tightness.   Cardiovascular: Negative for chest pain, palpitations and leg swelling.  Gastrointestinal: Negative for abdominal pain, diarrhea and vomiting.    Patient Active Problem List   Diagnosis Date Noted  . Age-related osteoporosis without current pathological fracture 04/16/2016  . Anemia 12/12/2015  . Neoplasm of left breast, primary tumor staging category Tis: ductal carcinoma in situ (DCIS) 01/03/2015  . Neoplasm of right breast, primary tumor staging category Tis: ductal carcinoma in situ (DCIS) 12/27/2014  . Anxiety disorder 10/11/2014  . Plantar fasciitis 10/11/2014  . Fibrositis 10/11/2014  . Local edema 10/11/2014  . Depression, major, single episode, in partial remission ([place]) 10/11/2014  . Hot flash, menopausal 10/11/2014  . Migraine without aura and responsive to treatment 10/11/2014  . Idiopathic insomnia 10/11/2014  . Psoriasis 10/11/2014  . Restless leg 10/11/2014  . Avitaminosis D 10/11/2014    Prior to Admission medications    Medication Sig Start Date End Date Taking? Authorizing Provider  betamethasone dipropionate (DIPROLENE) 0.05 % cream Apply 1 application topically 2 (two) times daily.   Yes Historical Provider, MD  busPIRone (BUSPAR) 10 MG tablet TAKE 1 TABLET FOUR TIMES DAILY 10/05/15  Yes Glean Hess, MD  Calcium-Magnesium-Vitamin D [id number] MG-MG-UNIT TB24 Take 1 tablet by mouth daily.   Yes Historical Provider, MD  cholecalciferol (VITAMIN D) 1000 UNITS tablet Take 1 tablet by mouth daily.   Yes Historical Provider, MD  cloNIDine (CATAPRES) 0.1 MG tablet Take 1 tablet by mouth at bedtime. 03/02/15  Yes Historical Provider, MD  ferrous sulfate 325 (65 FE) MG tablet Take 325 mg by mouth daily with breakfast.   Yes Historical Provider, MD  furosemide (LASIX) 20 MG tablet TAKE 1 TABLET EVERY DAY 08/22/15  Yes Glean Hess, MD  gabapentin (NEURONTIN) 100 MG capsule TAKE 1 CAPSULE THREE TIMES DAILY 02/13/16  Yes Glean Hess, MD  HYDROcodone-acetaminophen (NORCO/VICODIN) 5-325 MG tablet Take 1 tablet by mouth 3 (three) times daily. 05/08/16  Yes Glean Hess, MD  metaxalone (SKELAXIN) 800 MG tablet Take 1 tablet (800 mg total) by mouth every 8 (eight) hours. 10/16/15  Yes Glean Hess, MD  MULTIPLE VITAMINS-MINERALS PO Take 1 tablet by mouth daily.   Yes Historical Provider, MD  propranolol ER (INDERAL LA) 60 MG 24 hr capsule TAKE 1 CAPSULE DAILY 10/22/15  Yes Glean Hess, MD  rOPINIRole (REQUIP) 0.5 MG tablet TAKE 7 TABLETS AT BEDTIME 04/23/16  Yes Glean Hess, MD    Allergies  Allergen Reactions  . Cephalexin Hives  . Letrozole Swelling  .  Raloxifene Hives  . Selective Estrogen Receptor Modulators Hives  . Cephalosporins   . Duloxetine Hcl   . Lyrica [Pregabalin]   . Oxycodone   . Penicillins   . Prilosec  [Omeprazole]   . Sulfa Antibiotics     Past Surgical History:  Procedure Laterality Date  . ABDOMINAL HYSTERECTOMY  1986   uterine fibroids and endometriosis  .  BREAST BIOPSY Right 06/01/12   [place] bx/clip-neg  . BREAST RECONSTRUCTION Bilateral 2017  . CARPAL TUNNEL RELEASE Right 2014  . EYE SURGERY Bilateral 2009   catarcts  . MASTECTOMY, RADICAL Bilateral 2016  . REDUCTION MAMMAPLASTY     2001  . THUMB ARTHROSCOPY Right 2014    Social History  Substance Use Topics  . Smoking status: Never Smoker  . Smokeless tobacco: Never Used  . Alcohol use No     Medication list has been reviewed and updated.   Physical Exam  Constitutional: She is oriented to person, place, and time. She appears well-developed. No distress.  HENT:  Head: Normocephalic and atraumatic.  Nose: Right sinus exhibits maxillary sinus tenderness. Left sinus exhibits maxillary sinus tenderness.  Cardiovascular: Normal rate, regular rhythm and normal heart sounds.   Pulmonary/Chest: Effort normal. No respiratory distress. She has decreased breath sounds. She has wheezes. She has no rhonchi. She has no rales.  Musculoskeletal: Normal range of motion.  Neurological: She is alert and oriented to person, place, and time.  Skin: Skin is warm and dry. No rash noted.  Psychiatric: She has a normal mood and affect. Her behavior is normal. Thought content normal.  Nursing note and vitals reviewed.   Pulse 60   Temp 98.1 F (36.7 C)   Resp 16   Ht 5\' 2"  (1.575 m)   Wt 177 lb (80.3 kg)   SpO2 98%   BMI 32.37 kg/m   Assessment and Plan: 1. Bronchitis - levofloxacin (LEVAQUIN) 500 MG tablet; Take 1 tablet (500 mg total) by mouth daily.  Dispense: 10 tablet; Refill: 0 - albuterol (PROVENTIL HFA;VENTOLIN HFA) 108 (90 Base) MCG/ACT inhaler; Inhale 2 puffs into the lungs every 6 (six) hours as needed for wheezing or shortness of breath.  Dispense: 1 Inhaler; Refill: 2 - HYDROcodone-homatropine (HYCODAN) 5-1.5 MG/5ML syrup; Take 5 mLs by mouth every 6 (six) hours as needed for cough.  Dispense: 120 mL; Refill: 0   Halina Maidens, MD [facility]  05/12/2016

## 2016-05-20 ENCOUNTER — Encounter: Payer: Self-pay | Admitting: Internal Medicine

## 2016-05-20 DIAGNOSIS — E785 Hyperlipidemia, unspecified: Secondary | ICD-10-CM | POA: Insufficient documentation

## 2016-05-20 DIAGNOSIS — E782 Mixed hyperlipidemia: Secondary | ICD-10-CM | POA: Insufficient documentation

## 2016-05-28 DIAGNOSIS — L03031 Cellulitis of right toe: Secondary | ICD-10-CM | POA: Diagnosis not present

## 2016-05-28 DIAGNOSIS — L03032 Cellulitis of left toe: Secondary | ICD-10-CM | POA: Diagnosis not present

## 2016-05-28 DIAGNOSIS — M79671 Pain in right foot: Secondary | ICD-10-CM | POA: Diagnosis not present

## 2016-05-28 DIAGNOSIS — M7661 Achilles tendinitis, right leg: Secondary | ICD-10-CM | POA: Diagnosis not present

## 2016-06-04 ENCOUNTER — Telehealth: Payer: Self-pay

## 2016-06-04 DIAGNOSIS — M7661 Achilles tendinitis, right leg: Secondary | ICD-10-CM | POA: Diagnosis not present

## 2016-06-04 DIAGNOSIS — M6281 Muscle weakness (generalized): Secondary | ICD-10-CM | POA: Diagnosis not present

## 2016-06-04 NOTE — Telephone Encounter (Signed)
Refill hydrocodone will pick up at 2 PM.

## 2016-06-06 ENCOUNTER — Other Ambulatory Visit: Payer: Self-pay | Admitting: Internal Medicine

## 2016-06-06 DIAGNOSIS — M797 Fibromyalgia: Secondary | ICD-10-CM

## 2016-06-06 MED ORDER — HYDROCODONE-ACETAMINOPHEN 5-325 MG PO TABS: 1.0000 | ORAL_TABLET | Freq: Three times a day (TID) | ORAL | 0 refills | Status: DC

## 2016-06-09 DIAGNOSIS — M6281 Muscle weakness (generalized): Secondary | ICD-10-CM | POA: Diagnosis not present

## 2016-06-09 DIAGNOSIS — M7661 Achilles tendinitis, right leg: Secondary | ICD-10-CM | POA: Diagnosis not present

## 2016-06-11 DIAGNOSIS — L03031 Cellulitis of right toe: Secondary | ICD-10-CM | POA: Diagnosis not present

## 2016-06-11 DIAGNOSIS — L03032 Cellulitis of left toe: Secondary | ICD-10-CM | POA: Diagnosis not present

## 2016-06-13 DIAGNOSIS — M7661 Achilles tendinitis, right leg: Secondary | ICD-10-CM | POA: Diagnosis not present

## 2016-06-13 DIAGNOSIS — M6281 Muscle weakness (generalized): Secondary | ICD-10-CM | POA: Diagnosis not present

## 2016-06-16 DIAGNOSIS — M7661 Achilles tendinitis, right leg: Secondary | ICD-10-CM | POA: Diagnosis not present

## 2016-06-16 DIAGNOSIS — M6281 Muscle weakness (generalized): Secondary | ICD-10-CM | POA: Diagnosis not present

## 2016-06-17 ENCOUNTER — Other Ambulatory Visit: Payer: Self-pay | Admitting: Internal Medicine

## 2016-06-18 DIAGNOSIS — Z872 Personal history of diseases of the skin and subcutaneous tissue: Secondary | ICD-10-CM | POA: Diagnosis not present

## 2016-06-18 DIAGNOSIS — D485 Neoplasm of uncertain behavior of skin: Secondary | ICD-10-CM | POA: Diagnosis not present

## 2016-06-18 DIAGNOSIS — Z1283 Encounter for screening for malignant neoplasm of skin: Secondary | ICD-10-CM | POA: Diagnosis not present

## 2016-06-19 DIAGNOSIS — M7661 Achilles tendinitis, right leg: Secondary | ICD-10-CM | POA: Diagnosis not present

## 2016-06-19 DIAGNOSIS — M6281 Muscle weakness (generalized): Secondary | ICD-10-CM | POA: Diagnosis not present

## 2016-06-23 DIAGNOSIS — M6281 Muscle weakness (generalized): Secondary | ICD-10-CM | POA: Diagnosis not present

## 2016-06-23 DIAGNOSIS — M7661 Achilles tendinitis, right leg: Secondary | ICD-10-CM | POA: Diagnosis not present

## 2016-07-01 DIAGNOSIS — M7661 Achilles tendinitis, right leg: Secondary | ICD-10-CM | POA: Diagnosis not present

## 2016-07-01 DIAGNOSIS — M6281 Muscle weakness (generalized): Secondary | ICD-10-CM | POA: Diagnosis not present

## 2016-07-03 ENCOUNTER — Other Ambulatory Visit: Payer: Self-pay | Admitting: Internal Medicine

## 2016-07-03 DIAGNOSIS — M797 Fibromyalgia: Secondary | ICD-10-CM

## 2016-07-03 MED ORDER — HYDROCODONE-ACETAMINOPHEN 5-325 MG PO TABS: 1.0000 | ORAL_TABLET | Freq: Three times a day (TID) | ORAL | 0 refills | Status: DC

## 2016-07-03 MED ORDER — BUSPIRONE HCL 10 MG PO TABS: 10.0000 mg | ORAL_TABLET | Freq: Four times a day (QID) | ORAL | 3 refills | Status: DC

## 2016-07-04 DIAGNOSIS — M6281 Muscle weakness (generalized): Secondary | ICD-10-CM | POA: Diagnosis not present

## 2016-07-04 DIAGNOSIS — M7661 Achilles tendinitis, right leg: Secondary | ICD-10-CM | POA: Diagnosis not present

## 2016-07-14 DIAGNOSIS — C50412 Malignant neoplasm of upper-outer quadrant of left female breast: Secondary | ICD-10-CM | POA: Diagnosis not present

## 2016-07-14 DIAGNOSIS — M797 Fibromyalgia: Secondary | ICD-10-CM | POA: Diagnosis not present

## 2016-07-14 DIAGNOSIS — Z170 Estrogen receptor positive status [ER+]: Secondary | ICD-10-CM | POA: Diagnosis not present

## 2016-07-14 DIAGNOSIS — G4700 Insomnia, unspecified: Secondary | ICD-10-CM | POA: Diagnosis not present

## 2016-07-14 DIAGNOSIS — D649 Anemia, unspecified: Secondary | ICD-10-CM | POA: Diagnosis not present

## 2016-07-14 DIAGNOSIS — C50919 Malignant neoplasm of unspecified site of unspecified female breast: Secondary | ICD-10-CM | POA: Diagnosis not present

## 2016-07-14 DIAGNOSIS — Z90710 Acquired absence of both cervix and uterus: Secondary | ICD-10-CM | POA: Diagnosis not present

## 2016-07-21 DIAGNOSIS — M7661 Achilles tendinitis, right leg: Secondary | ICD-10-CM | POA: Diagnosis not present

## 2016-07-22 DIAGNOSIS — Z9882 Breast implant status: Secondary | ICD-10-CM | POA: Diagnosis not present

## 2016-07-28 ENCOUNTER — Other Ambulatory Visit: Payer: Self-pay | Admitting: Internal Medicine

## 2016-07-29 DIAGNOSIS — D485 Neoplasm of uncertain behavior of skin: Secondary | ICD-10-CM | POA: Diagnosis not present

## 2016-07-29 DIAGNOSIS — M797 Fibromyalgia: Secondary | ICD-10-CM | POA: Insufficient documentation

## 2016-07-29 DIAGNOSIS — D229 Melanocytic nevi, unspecified: Secondary | ICD-10-CM | POA: Diagnosis not present

## 2016-07-31 ENCOUNTER — Telehealth: Payer: Self-pay | Admitting: Internal Medicine

## 2016-07-31 NOTE — Telephone Encounter (Signed)
Pt called need refill Rx hydrocodone .Marland KitchenMarland Kitchenplease advice

## 2016-08-01 ENCOUNTER — Other Ambulatory Visit: Payer: Self-pay | Admitting: Internal Medicine

## 2016-08-01 DIAGNOSIS — M797 Fibromyalgia: Secondary | ICD-10-CM

## 2016-08-01 MED ORDER — HYDROCODONE-ACETAMINOPHEN 5-325 MG PO TABS: 1.0000 | ORAL_TABLET | Freq: Three times a day (TID) | ORAL | 0 refills | Status: DC

## 2016-08-11 ENCOUNTER — Ambulatory Visit (INDEPENDENT_AMBULATORY_CARE_PROVIDER_SITE_OTHER): Payer: Medicare PPO | Admitting: Internal Medicine

## 2016-08-11 ENCOUNTER — Encounter: Payer: Self-pay | Admitting: Internal Medicine

## 2016-08-11 VITALS — HR 71 | Temp 97.0°F | Ht 62.0 in | Wt 183.0 lb

## 2016-08-11 DIAGNOSIS — J0100 Acute maxillary sinusitis, unspecified: Secondary | ICD-10-CM

## 2016-08-11 MED ORDER — OSELTAMIVIR PHOSPHATE 75 MG PO CAPS: 75.0000 mg | ORAL_CAPSULE | Freq: Every day | ORAL | 0 refills | Status: DC

## 2016-08-11 MED ORDER — HYDROCODONE-HOMATROPINE 5-1.5 MG/5ML PO SYRP: 5.0000 mL | ORAL_SOLUTION | Freq: Four times a day (QID) | ORAL | 0 refills | Status: DC | PRN

## 2016-08-11 MED ORDER — LEVOFLOXACIN 500 MG PO TABS: 500.0000 mg | ORAL_TABLET | Freq: Every day | ORAL | 0 refills | Status: DC

## 2016-08-11 NOTE — Progress Notes (Signed)
Date:  08/11/2016   Name:  Sandra Mckinney   DOB:  10/30/1945   MRN:  [id number]   Chief Complaint: Sinus Problem Sinus Problem  This is a new problem. The current episode started in the past 7 days. The problem has been gradually worsening since onset. There has been no fever. Her pain is at a severity of 1/10. The pain is mild. Associated symptoms include coughing, ear pain, sinus pressure and a sore throat. Pertinent negatives include no chills or diaphoresis. Past treatments include oral decongestants. The treatment provided mild relief.      Review of Systems  Constitutional: Positive for fatigue. Negative for chills, diaphoresis and fever.  HENT: Positive for ear pain, sinus pressure and sore throat.   Respiratory: Positive for cough. Negative for chest tightness and wheezing.   Cardiovascular: Negative for chest pain and palpitations.    Patient Active Problem List   Diagnosis Date Noted  . Hyperlipidemia 05/20/2016  . Age-related osteoporosis without current pathological fracture 04/16/2016  . Anemia 12/12/2015  . Neoplasm of left breast, primary tumor staging category Tis: ductal carcinoma in situ (DCIS) 01/03/2015  . Neoplasm of right breast, primary tumor staging category Tis: ductal carcinoma in situ (DCIS) 12/27/2014  . Anxiety disorder 10/11/2014  . Plantar fasciitis 10/11/2014  . Fibrositis 10/11/2014  . Local edema 10/11/2014  . Depression, major, single episode, in partial remission ([place]) 10/11/2014  . Hot flash, menopausal 10/11/2014  . Migraine without aura and responsive to treatment 10/11/2014  . Idiopathic insomnia 10/11/2014  . Psoriasis 10/11/2014  . Restless leg 10/11/2014  . Avitaminosis D 10/11/2014    Prior to Admission medications   Medication Sig Start Date End Date Taking? Authorizing Provider  betamethasone dipropionate (DIPROLENE) 0.05 % cream Apply 1 application topically 2 (two) times daily.   Yes Historical Provider, MD    busPIRone (BUSPAR) 10 MG tablet Take 1 tablet (10 mg total) by mouth QID. 07/03/16  Yes Glean Hess, MD  Calcium-Magnesium-Vitamin D [id number] MG-MG-UNIT TB24 Take 1 tablet by mouth daily.   Yes Historical Provider, MD  cholecalciferol (VITAMIN D) 1000 UNITS tablet Take 1 tablet by mouth daily.   Yes Historical Provider, MD  cloNIDine (CATAPRES) 0.1 MG tablet Take 1 tablet by mouth at bedtime. 03/02/15  Yes Historical Provider, MD  ferrous sulfate 325 (65 FE) MG tablet Take 325 mg by mouth daily with breakfast.   Yes Historical Provider, MD  furosemide (LASIX) 20 MG tablet TAKE 1 TABLET EVERY DAY 06/18/16  Yes Glean Hess, MD  gabapentin (NEURONTIN) 100 MG capsule TAKE 1 CAPSULE THREE TIMES DAILY 02/13/16  Yes Glean Hess, MD  HYDROcodone-acetaminophen (NORCO/VICODIN) 5-325 MG tablet Take 1 tablet by mouth 3 (three) times daily. 08/01/16  Yes Glean Hess, MD  HYDROcodone-homatropine [facility]) 5-1.5 MG/5ML syrup Take 5 mLs by mouth every 6 (six) hours as needed for cough. 05/12/16  Yes Glean Hess, MD  metaxalone (SKELAXIN) 800 MG tablet Take 1 tablet (800 mg total) by mouth every 8 (eight) hours. 05/12/16  Yes Glean Hess, MD  MULTIPLE VITAMINS-MINERALS PO Take 1 tablet by mouth daily.   Yes Historical Provider, MD  propranolol ER (INDERAL LA) 60 MG 24 hr capsule TAKE 1 CAPSULE EVERY DAY 07/28/16  Yes Glean Hess, MD  rOPINIRole (REQUIP) 0.5 MG tablet TAKE 7 TABLETS AT BEDTIME 04/23/16  Yes Glean Hess, MD    Allergies  Allergen Reactions  . Cephalexin Hives  . Letrozole Swelling  .  Raloxifene Hives  . Selective Estrogen Receptor Modulators Hives  . Cephalosporins   . Duloxetine Hcl   . Lyrica [Pregabalin]   . Oxycodone   . Penicillins   . Prilosec  [Omeprazole]   . Sulfa Antibiotics     Past Surgical History:  Procedure Laterality Date  . ABDOMINAL HYSTERECTOMY  1986   uterine fibroids and endometriosis  . BREAST BIOPSY Right 06/01/12   [place]  bx/clip-neg  . BREAST RECONSTRUCTION Bilateral 2017  . CARPAL TUNNEL RELEASE Right 2014  . EYE SURGERY Bilateral 2009   catarcts  . MASTECTOMY, RADICAL Bilateral 2016  . REDUCTION MAMMAPLASTY     2001  . THUMB ARTHROSCOPY Right 2014    Social History  Substance Use Topics  . Smoking status: Never Smoker  . Smokeless tobacco: Never Used  . Alcohol use No     Medication list has been reviewed and updated.   Physical Exam  Constitutional: She is oriented to person, place, and time. She appears well-developed and well-nourished.  HENT:  Right Ear: Tympanic membrane, external ear and ear canal normal. Tympanic membrane is not erythematous and not retracted.  Left Ear: Tympanic membrane, external ear and ear canal normal. Tympanic membrane is not erythematous and not retracted.  Nose: Right sinus exhibits maxillary sinus tenderness and frontal sinus tenderness. Left sinus exhibits maxillary sinus tenderness and frontal sinus tenderness.  Mouth/Throat: Uvula is midline and mucous membranes are normal. No oral lesions. Posterior oropharyngeal erythema present. No oropharyngeal exudate.  Cardiovascular: Normal rate, regular rhythm and normal heart sounds.   Pulmonary/Chest: Breath sounds normal. She has no wheezes. She has no rales.  Lymphadenopathy:    She has no cervical adenopathy.  Neurological: She is alert and oriented to person, place, and time.    Pulse 71   Temp 97 F (36.1 C)   Ht 5\' 2"  (1.575 m)   Wt 183 lb (83 kg)   SpO2 98%   BMI 33.47 kg/m   Assessment and Plan: 1. Acute non-recurrent maxillary sinusitis - levofloxacin (LEVAQUIN) 500 MG tablet; Take 1 tablet (500 mg total) by mouth daily.  Dispense: 7 tablet; Refill: 0 - oseltamivir (TAMIFLU) 75 MG capsule; Take 1 capsule (75 mg total) by mouth daily.  Dispense: 10 capsule; Refill: 0 - HYDROcodone-homatropine (HYCODAN) 5-1.5 MG/5ML syrup; Take 5 mLs by mouth every 6 (six) hours as needed for cough.  Dispense: 120  mL; Refill: 0   Halina Maidens, MD [facility]  08/11/2016

## 2016-08-28 ENCOUNTER — Telehealth: Payer: Self-pay

## 2016-08-28 NOTE — Telephone Encounter (Signed)
Pt called requesting refill on Norco/vicodin. Pt was last seen 08/11/2016.

## 2016-08-29 ENCOUNTER — Other Ambulatory Visit: Payer: Self-pay | Admitting: Internal Medicine

## 2016-08-29 DIAGNOSIS — M797 Fibromyalgia: Secondary | ICD-10-CM

## 2016-08-29 MED ORDER — HYDROCODONE-ACETAMINOPHEN 5-325 MG PO TABS: 1.0000 | ORAL_TABLET | Freq: Three times a day (TID) | ORAL | 0 refills | Status: DC

## 2016-09-02 DIAGNOSIS — D485 Neoplasm of uncertain behavior of skin: Secondary | ICD-10-CM | POA: Diagnosis not present

## 2016-09-02 DIAGNOSIS — L918 Other hypertrophic disorders of the skin: Secondary | ICD-10-CM | POA: Diagnosis not present

## 2016-09-02 DIAGNOSIS — Z86018 Personal history of other benign neoplasm: Secondary | ICD-10-CM | POA: Diagnosis not present

## 2016-09-02 DIAGNOSIS — L821 Other seborrheic keratosis: Secondary | ICD-10-CM | POA: Diagnosis not present

## 2016-09-09 DIAGNOSIS — M7660 Achilles tendinitis, unspecified leg: Secondary | ICD-10-CM | POA: Diagnosis not present

## 2016-09-26 ENCOUNTER — Other Ambulatory Visit: Payer: Self-pay | Admitting: Internal Medicine

## 2016-09-26 ENCOUNTER — Telehealth: Payer: Self-pay

## 2016-09-26 DIAGNOSIS — M797 Fibromyalgia: Secondary | ICD-10-CM

## 2016-09-26 MED ORDER — HYDROCODONE-ACETAMINOPHEN 5-325 MG PO TABS: 1.0000 | ORAL_TABLET | Freq: Three times a day (TID) | ORAL | 0 refills | Status: DC

## 2016-09-26 NOTE — Telephone Encounter (Signed)
ERROR

## 2016-09-26 NOTE — Telephone Encounter (Signed)
Pt called stating she wants pick up her Hydrocodone prescription this morning .

## 2016-09-29 DIAGNOSIS — D229 Melanocytic nevi, unspecified: Secondary | ICD-10-CM | POA: Diagnosis not present

## 2016-09-29 DIAGNOSIS — D485 Neoplasm of uncertain behavior of skin: Secondary | ICD-10-CM | POA: Diagnosis not present

## 2016-09-29 DIAGNOSIS — D225 Melanocytic nevi of trunk: Secondary | ICD-10-CM | POA: Diagnosis not present

## 2016-10-15 ENCOUNTER — Encounter: Payer: Self-pay | Admitting: Internal Medicine

## 2016-10-15 ENCOUNTER — Ambulatory Visit (INDEPENDENT_AMBULATORY_CARE_PROVIDER_SITE_OTHER): Payer: Medicare PPO | Admitting: Internal Medicine

## 2016-10-15 VITALS — HR 62 | Ht 62.0 in | Wt 184.2 lb

## 2016-10-15 DIAGNOSIS — M722 Plantar fascial fibromatosis: Secondary | ICD-10-CM | POA: Diagnosis not present

## 2016-10-15 DIAGNOSIS — M797 Fibromyalgia: Secondary | ICD-10-CM

## 2016-10-15 DIAGNOSIS — M810 Age-related osteoporosis without current pathological fracture: Secondary | ICD-10-CM

## 2016-10-15 MED ORDER — HYDROCODONE-ACETAMINOPHEN 5-325 MG PO TABS: 1.0000 | ORAL_TABLET | Freq: Three times a day (TID) | ORAL | 0 refills | Status: DC

## 2016-10-15 NOTE — Progress Notes (Signed)
Date:  10/15/2016   Name:  Sandra Mckinney   DOB:  [birth year]   MRN:  [id number]   Chief Complaint: Fibromyalgia (follow up for med refill) Fibromyalgia -  Ongoing sx treated with narcotic pain medications. She takes 3 hydrocodone per day with no need for escalating dose and no problems with prescription compliance. Urine drug screen was performed in October 2017 and was in compliance.  Plantar fasciitis -  Improving.  Using NTG patch and stretching.  Trying to get improved blood flow.     Review of Systems  Constitutional: Positive for unexpected weight change. Negative for chills, diaphoresis and fever.  Eyes: Negative for visual disturbance.  Respiratory: Negative for chest tightness, shortness of breath and wheezing.   Cardiovascular: Negative for chest pain, palpitations and leg swelling.  Gastrointestinal: Negative for abdominal pain, constipation and diarrhea.  Musculoskeletal: Positive for myalgias.  Neurological: Positive for numbness (RLS). Negative for tremors and weakness.  Psychiatric/Behavioral: Negative for dysphoric mood and sleep disturbance. The patient is not nervous/anxious.     Patient Active Problem List   Diagnosis Date Noted  . Fibromyalgia 07/29/2016  . Hyperlipidemia 05/20/2016  . Age-related osteoporosis without current pathological fracture 04/16/2016  . Anemia 12/12/2015  . Neoplasm of left breast, primary tumor staging category Tis: ductal carcinoma in situ (DCIS) 01/03/2015  . Neoplasm of right breast, primary tumor staging category Tis: ductal carcinoma in situ (DCIS) 12/27/2014  . Anxiety disorder 10/11/2014  . Plantar fasciitis 10/11/2014  . Local edema 10/11/2014  . Depression, major, single episode, in partial remission ([place]) 10/11/2014  . Hot flash, menopausal 10/11/2014  . Migraine without aura and responsive to treatment 10/11/2014  . Idiopathic insomnia 10/11/2014  . Psoriasis 10/11/2014  . Restless leg 10/11/2014  .  Avitaminosis D 10/11/2014    Prior to Admission medications   Medication Sig Start Date End Date Taking? Authorizing Provider  betamethasone dipropionate (DIPROLENE) 0.05 % cream Apply 1 application topically 2 (two) times daily.   Yes Historical Provider, MD  busPIRone (BUSPAR) 10 MG tablet Take 1 tablet (10 mg total) by mouth QID. 07/03/16  Yes Glean Hess, MD  Calcium-Magnesium-Vitamin D 409-81-191 MG-MG-UNIT TB24 Take 1 tablet by mouth daily.   Yes Historical Provider, MD  cholecalciferol (VITAMIN D) 1000 UNITS tablet Take 1 tablet by mouth daily.   Yes Historical Provider, MD  cloNIDine (CATAPRES) 0.1 MG tablet Take 1 tablet by mouth at bedtime. 03/02/15  Yes Historical Provider, MD  ferrous sulfate 325 (65 FE) MG tablet Take 325 mg by mouth daily with breakfast.   Yes Historical Provider, MD  furosemide (LASIX) 20 MG tablet TAKE 1 TABLET EVERY DAY 06/18/16  Yes Glean Hess, MD  gabapentin (NEURONTIN) 100 MG capsule TAKE 1 CAPSULE THREE TIMES DAILY 02/13/16  Yes Glean Hess, MD  HYDROcodone-acetaminophen (NORCO/VICODIN) 5-325 MG tablet Take 1 tablet by mouth 3 (three) times daily. 09/26/16  Yes Glean Hess, MD  metaxalone (SKELAXIN) 800 MG tablet Take 1 tablet (800 mg total) by mouth every 8 (eight) hours. 05/12/16  Yes Glean Hess, MD  MULTIPLE VITAMINS-MINERALS PO Take 1 tablet by mouth daily.   Yes Historical Provider, MD  propranolol ER (INDERAL LA) 60 MG 24 hr capsule TAKE 1 CAPSULE EVERY DAY 07/28/16  Yes Glean Hess, MD  rOPINIRole (REQUIP) 0.5 MG tablet TAKE 7 TABLETS AT BEDTIME 04/23/16  Yes Glean Hess, MD  zolpidem (AMBIEN) 5 MG tablet Take 5 mg by mouth at bedtime  as needed for sleep.   Yes Historical Provider, MD    Allergies  Allergen Reactions  . Cephalexin Hives  . Letrozole Swelling  . Raloxifene Hives  . Selective Estrogen Receptor Modulators Hives  . Cephalosporins   . Duloxetine Hcl   . Lyrica [Pregabalin]   . Oxycodone   .  Penicillins   . Prilosec  [Omeprazole]   . Sulfa Antibiotics     Past Surgical History:  Procedure Laterality Date  . ABDOMINAL HYSTERECTOMY  1986   uterine fibroids and endometriosis  . BREAST BIOPSY Right 06/01/12   [place] bx/clip-neg  . BREAST RECONSTRUCTION Bilateral 2017  . CARPAL TUNNEL RELEASE Right 2014  . EYE SURGERY Bilateral 2009   catarcts  . MASTECTOMY, RADICAL Bilateral 2016  . REDUCTION MAMMAPLASTY     2001  . THUMB ARTHROSCOPY Right 2014    Social History  Substance Use Topics  . Smoking status: Never Smoker  . Smokeless tobacco: Never Used  . Alcohol use No     Medication list has been reviewed and updated.   Physical Exam  Constitutional: She is oriented to person, place, and time. She appears well-developed. No distress.  HENT:  Head: Normocephalic and atraumatic.  Neck: Normal range of motion.  Cardiovascular: Normal rate, regular rhythm and normal heart sounds.   Pulmonary/Chest: Effort normal and breath sounds normal. No respiratory distress. She has no wheezes.  Musculoskeletal: She exhibits no edema.  Neurological: She is alert and oriented to person, place, and time.  Skin: Skin is warm and dry. No rash noted.  Psychiatric: She has a normal mood and affect. Her behavior is normal. Thought content normal.  Nursing note and vitals reviewed.   Pulse 62   Ht 5\' 2"  (1.575 m)   Wt 184 lb 3.2 oz (83.6 kg)   SpO2 98%   BMI 33.69 kg/m   Assessment and Plan: 1. Fibromyalgia Stable, continue hydrocodone and gabapentin  2. Plantar fasciitis Improving with podiatry treatment  3. Age-related osteoporosis without current pathological fracture Continue calcium and vitamin D    Meds ordered this encounter  Medications  . HYDROcodone-acetaminophen (NORCO/VICODIN) 5-325 MG tablet    Sig: Take 1 tablet by mouth 3 (three) times daily.    Dispense:  90 tablet    Refill:  0    Fill after 10/25/16    Halina Maidens, MD [facility]  10/15/2016

## 2016-11-06 DIAGNOSIS — M7661 Achilles tendinitis, right leg: Secondary | ICD-10-CM | POA: Diagnosis not present

## 2016-11-06 DIAGNOSIS — G8929 Other chronic pain: Secondary | ICD-10-CM | POA: Diagnosis not present

## 2016-11-06 DIAGNOSIS — M79671 Pain in right foot: Secondary | ICD-10-CM | POA: Diagnosis not present

## 2016-11-20 ENCOUNTER — Telehealth: Payer: Self-pay

## 2016-11-20 ENCOUNTER — Other Ambulatory Visit: Payer: Self-pay | Admitting: Internal Medicine

## 2016-11-20 DIAGNOSIS — M797 Fibromyalgia: Secondary | ICD-10-CM

## 2016-11-20 MED ORDER — HYDROCODONE-ACETAMINOPHEN 5-325 MG PO TABS: 1.0000 | ORAL_TABLET | Freq: Three times a day (TID) | ORAL | 0 refills | Status: DC

## 2016-11-20 NOTE — Telephone Encounter (Signed)
Pt calling stating she would like refill on Hydrocodone medication and will pick up tomorrow morning.

## 2016-12-12 DIAGNOSIS — M7661 Achilles tendinitis, right leg: Secondary | ICD-10-CM | POA: Diagnosis not present

## 2016-12-18 ENCOUNTER — Telehealth: Payer: Self-pay

## 2016-12-18 ENCOUNTER — Other Ambulatory Visit: Payer: Self-pay | Admitting: Internal Medicine

## 2016-12-18 DIAGNOSIS — M797 Fibromyalgia: Secondary | ICD-10-CM

## 2016-12-18 MED ORDER — HYDROCODONE-ACETAMINOPHEN 5-325 MG PO TABS: 1.0000 | ORAL_TABLET | Freq: Three times a day (TID) | ORAL | 0 refills | Status: DC

## 2016-12-18 NOTE — Telephone Encounter (Signed)
Pt requesting refill on hydrocodone. Pt states husband will pick up tomorrow in office.

## 2016-12-18 NOTE — Telephone Encounter (Signed)
Printed and ready for pick up. 

## 2017-01-05 ENCOUNTER — Other Ambulatory Visit: Payer: Self-pay | Admitting: Internal Medicine

## 2017-01-05 DIAGNOSIS — G2581 Restless legs syndrome: Secondary | ICD-10-CM

## 2017-01-12 DIAGNOSIS — G4700 Insomnia, unspecified: Secondary | ICD-10-CM | POA: Diagnosis not present

## 2017-01-12 DIAGNOSIS — M797 Fibromyalgia: Secondary | ICD-10-CM | POA: Diagnosis not present

## 2017-01-12 DIAGNOSIS — Z9221 Personal history of antineoplastic chemotherapy: Secondary | ICD-10-CM | POA: Diagnosis not present

## 2017-01-12 DIAGNOSIS — Z9011 Acquired absence of right breast and nipple: Secondary | ICD-10-CM | POA: Diagnosis not present

## 2017-01-12 DIAGNOSIS — C50919 Malignant neoplasm of unspecified site of unspecified female breast: Secondary | ICD-10-CM | POA: Diagnosis not present

## 2017-01-12 DIAGNOSIS — M779 Enthesopathy, unspecified: Secondary | ICD-10-CM | POA: Diagnosis not present

## 2017-01-12 DIAGNOSIS — Z90710 Acquired absence of both cervix and uterus: Secondary | ICD-10-CM | POA: Diagnosis not present

## 2017-01-12 DIAGNOSIS — Z170 Estrogen receptor positive status [ER+]: Secondary | ICD-10-CM | POA: Diagnosis not present

## 2017-01-12 DIAGNOSIS — C50412 Malignant neoplasm of upper-outer quadrant of left female breast: Secondary | ICD-10-CM | POA: Diagnosis not present

## 2017-01-12 DIAGNOSIS — D649 Anemia, unspecified: Secondary | ICD-10-CM | POA: Diagnosis not present

## 2017-01-16 ENCOUNTER — Other Ambulatory Visit: Payer: Self-pay | Admitting: Internal Medicine

## 2017-01-16 ENCOUNTER — Telehealth: Payer: Self-pay

## 2017-01-16 DIAGNOSIS — M797 Fibromyalgia: Secondary | ICD-10-CM

## 2017-01-16 MED ORDER — HYDROCODONE-ACETAMINOPHEN 5-325 MG PO TABS: 1.0000 | ORAL_TABLET | Freq: Three times a day (TID) | ORAL | 0 refills | Status: DC

## 2017-01-16 NOTE — Telephone Encounter (Signed)
Pt calling to request [facility] refill- pt would like husband to be able to pick up RX this morning. Please advise.

## 2017-01-23 DIAGNOSIS — Z1211 Encounter for screening for malignant neoplasm of colon: Secondary | ICD-10-CM | POA: Diagnosis not present

## 2017-02-13 ENCOUNTER — Other Ambulatory Visit: Payer: Self-pay | Admitting: Internal Medicine

## 2017-02-13 DIAGNOSIS — M797 Fibromyalgia: Secondary | ICD-10-CM

## 2017-02-13 MED ORDER — HYDROCODONE-ACETAMINOPHEN 5-325 MG PO TABS: 1.0000 | ORAL_TABLET | Freq: Three times a day (TID) | ORAL | 0 refills | Status: DC

## 2017-02-24 DIAGNOSIS — Z86018 Personal history of other benign neoplasm: Secondary | ICD-10-CM | POA: Diagnosis not present

## 2017-02-24 DIAGNOSIS — L918 Other hypertrophic disorders of the skin: Secondary | ICD-10-CM | POA: Diagnosis not present

## 2017-02-24 DIAGNOSIS — D485 Neoplasm of uncertain behavior of skin: Secondary | ICD-10-CM | POA: Diagnosis not present

## 2017-02-25 DIAGNOSIS — Z1211 Encounter for screening for malignant neoplasm of colon: Secondary | ICD-10-CM | POA: Diagnosis not present

## 2017-02-25 DIAGNOSIS — Z1212 Encounter for screening for malignant neoplasm of rectum: Secondary | ICD-10-CM | POA: Diagnosis not present

## 2017-03-10 ENCOUNTER — Other Ambulatory Visit: Payer: Self-pay | Admitting: Internal Medicine

## 2017-03-13 ENCOUNTER — Other Ambulatory Visit: Payer: Self-pay | Admitting: Internal Medicine

## 2017-03-13 DIAGNOSIS — M797 Fibromyalgia: Secondary | ICD-10-CM

## 2017-03-13 MED ORDER — HYDROCODONE-ACETAMINOPHEN 5-325 MG PO TABS: 1.0000 | ORAL_TABLET | Freq: Three times a day (TID) | ORAL | 0 refills | Status: DC

## 2017-03-17 ENCOUNTER — Encounter: Payer: Self-pay | Admitting: Internal Medicine

## 2017-03-17 ENCOUNTER — Ambulatory Visit (INDEPENDENT_AMBULATORY_CARE_PROVIDER_SITE_OTHER): Payer: Medicare PPO | Admitting: Internal Medicine

## 2017-03-17 VITALS — HR 64 | Temp 98.2°F | Ht 62.0 in | Wt 180.0 lb

## 2017-03-17 DIAGNOSIS — J019 Acute sinusitis, unspecified: Secondary | ICD-10-CM | POA: Diagnosis not present

## 2017-03-17 MED ORDER — AZITHROMYCIN 250 MG PO TABS: ORAL_TABLET | ORAL | 0 refills | Status: DC

## 2017-03-17 NOTE — Progress Notes (Signed)
Date:  03/17/2017   Name:  Sandra Mckinney   DOB:  1946/01/01   MRN:  [id number]   Chief Complaint: Sinusitis (X 1.5 week- Coughing with thick green production. Congestion on left side of face. Using saline spray but not clearing up. )  Sinusitis  This is a new problem. The current episode started 1 to 4 weeks ago. The problem has been gradually worsening since onset. There has been no fever. Associated symptoms include sinus pressure and a sore throat. Pertinent negatives include no chills, ear pain, headaches or shortness of breath.     Review of Systems  Constitutional: Negative for chills, fatigue and fever.  HENT: Positive for postnasal drip, sinus pressure and sore throat. Negative for ear pain.   Respiratory: Positive for chest tightness. Negative for shortness of breath and wheezing.   Cardiovascular: Negative for chest pain and palpitations.  Gastrointestinal: Negative for abdominal pain.  Neurological: Negative for dizziness and headaches.    Patient Active Problem List   Diagnosis Date Noted  . Fibromyalgia 07/29/2016  . Hyperlipidemia 05/20/2016  . Age-related osteoporosis without current pathological fracture 04/16/2016  . Anemia 12/12/2015  . Neoplasm of left breast, primary tumor staging category Tis: ductal carcinoma in situ (DCIS) 01/03/2015  . Neoplasm of right breast, primary tumor staging category Tis: ductal carcinoma in situ (DCIS) 12/27/2014  . Anxiety disorder 10/11/2014  . Plantar fasciitis 10/11/2014  . Local edema 10/11/2014  . Depression, major, single episode, in partial remission ([place]) 10/11/2014  . Hot flash, menopausal 10/11/2014  . Migraine without aura and responsive to treatment 10/11/2014  . Idiopathic insomnia 10/11/2014  . Psoriasis 10/11/2014  . Restless leg 10/11/2014  . Avitaminosis D 10/11/2014    Prior to Admission medications   Medication Sig Start Date End Date Taking? Authorizing Provider  betamethasone  dipropionate (DIPROLENE) 0.05 % cream Apply 1 application topically 2 (two) times daily.   Yes [provider]  busPIRone (BUSPAR) 10 MG tablet TAKE 1 TABLET FOUR TIMES DAILY 03/10/17  Yes Glean Hess, MD  Calcium-Magnesium-Vitamin D 850-27-741 MG-MG-UNIT TB24 Take 1 tablet by mouth daily.   Yes [provider]  cholecalciferol (VITAMIN D) 1000 UNITS tablet Take 1 tablet by mouth daily.   Yes [provider]  cloNIDine (CATAPRES) 0.1 MG tablet Take 1 tablet by mouth at bedtime. 03/02/15  Yes [provider]  ferrous sulfate 325 (65 FE) MG tablet Take 325 mg by mouth daily with breakfast.   Yes [provider]  furosemide (LASIX) 20 MG tablet TAKE 1 TABLET EVERY DAY 02/13/17  Yes Glean Hess, MD  gabapentin (NEURONTIN) 100 MG capsule TAKE 1 CAPSULE THREE TIMES DAILY Patient taking differently: take 3 capsules three times a day 02/13/16  Yes Glean Hess, MD  HYDROcodone-acetaminophen (NORCO/VICODIN) 5-325 MG tablet Take 1 tablet by mouth 3 (three) times daily. 03/13/17 04/19/17 Yes Glean Hess, MD  metaxalone (SKELAXIN) 800 MG tablet Take 1 tablet (800 mg total) by mouth every 8 (eight) hours. 05/12/16  Yes Glean Hess, MD  MULTIPLE VITAMINS-MINERALS PO Take 1 tablet by mouth daily.   Yes [provider]  propranolol ER (INDERAL LA) 60 MG 24 hr capsule TAKE 1 CAPSULE EVERY DAY 07/28/16  Yes Glean Hess, MD  rOPINIRole (REQUIP) 0.5 MG tablet TAKE 7 TABLETS AT BEDTIME 01/06/17  Yes Glean Hess, MD    Allergies  Allergen Reactions  . Cephalexin Hives  . Letrozole Swelling  . Raloxifene Hives  .  Selective Estrogen Receptor Modulators Hives  . Cephalosporins   . Duloxetine Hcl   . Lyrica [Pregabalin]   . Oxycodone   . Penicillins   . Prilosec  [Omeprazole]   . Sulfa Antibiotics     Past Surgical History:  Procedure Laterality Date  . ABDOMINAL HYSTERECTOMY  1986   uterine fibroids and endometriosis  .  BREAST BIOPSY Right 06/01/12   [place] bx/clip-neg  . BREAST RECONSTRUCTION Bilateral 2017  . CARPAL TUNNEL RELEASE Right 2014  . EYE SURGERY Bilateral 2009   catarcts  . MASTECTOMY, RADICAL Bilateral 2016  . REDUCTION MAMMAPLASTY     2001  . THUMB ARTHROSCOPY Right 2014    Social History  Substance Use Topics  . Smoking status: Never Smoker  . Smokeless tobacco: Never Used  . Alcohol use No     Medication list has been reviewed and updated.  PHQ 2/9 Scores 03/17/2017 04/16/2016 12/12/2015 12/06/2014  PHQ - 2 Score 0 0 0 0  PHQ- 9 Score 3 - - -    Physical Exam  Constitutional: She is oriented to person, place, and time. She appears well-developed and well-nourished.  HENT:  Right Ear: External ear and ear canal normal. Tympanic membrane is not erythematous and not retracted.  Left Ear: External ear and ear canal normal. Tympanic membrane is not erythematous and not retracted.  Nose: Right sinus exhibits maxillary sinus tenderness. Right sinus exhibits no frontal sinus tenderness. Left sinus exhibits maxillary sinus tenderness. Left sinus exhibits no frontal sinus tenderness.  Mouth/Throat: Uvula is midline and mucous membranes are normal. No oral lesions. Posterior oropharyngeal erythema present. No oropharyngeal exudate or posterior oropharyngeal edema.  Cardiovascular: Normal rate, regular rhythm and normal heart sounds.   Pulmonary/Chest: Breath sounds normal. She has no decreased breath sounds. She has no wheezes. She has no rales.  Lymphadenopathy:    She has no cervical adenopathy.  Neurological: She is alert and oriented to person, place, and time.  Skin: Skin is warm and dry.    Pulse 64   Temp 98.2 F (36.8 C)   Ht 5\' 2"  (1.575 m)   Wt 180 lb (81.6 kg)   SpO2 98%   BMI 32.92 kg/m   Assessment and Plan: 1. Acute sinusitis, recurrence not specified, unspecified location Continue saline rinses and chlortrimeton - azithromycin (ZITHROMAX Z-PAK) 250 MG tablet; UAD   Dispense: 6 each; Refill: 0   Meds ordered this encounter  Medications  . azithromycin (ZITHROMAX Z-PAK) 250 MG tablet    Sig: UAD    Dispense:  6 each    Refill:  0    Partially dictated using Editor, commissioning. Any errors are unintentional.  Halina Maidens, MD [facility]  03/17/2017

## 2017-03-18 DIAGNOSIS — D229 Melanocytic nevi, unspecified: Secondary | ICD-10-CM | POA: Diagnosis not present

## 2017-03-18 DIAGNOSIS — D485 Neoplasm of uncertain behavior of skin: Secondary | ICD-10-CM | POA: Diagnosis not present

## 2017-04-06 ENCOUNTER — Other Ambulatory Visit: Payer: Self-pay | Admitting: Internal Medicine

## 2017-04-09 ENCOUNTER — Telehealth: Payer: Self-pay

## 2017-04-09 NOTE — Telephone Encounter (Signed)
Patient called and left VM to get refill on hydrocodone medication. Wants husband to pick up tomorrow morning. Please Advise.

## 2017-04-10 ENCOUNTER — Other Ambulatory Visit: Payer: Self-pay | Admitting: Internal Medicine

## 2017-04-10 DIAGNOSIS — M797 Fibromyalgia: Secondary | ICD-10-CM

## 2017-04-10 MED ORDER — HYDROCODONE-ACETAMINOPHEN 5-325 MG PO TABS: 1.0000 | ORAL_TABLET | Freq: Three times a day (TID) | ORAL | 0 refills | Status: DC

## 2017-04-13 ENCOUNTER — Ambulatory Visit (INDEPENDENT_AMBULATORY_CARE_PROVIDER_SITE_OTHER): Payer: Medicare PPO

## 2017-04-13 VITALS — HR 72 | Temp 97.5°F | Resp 12 | Ht 62.0 in | Wt 179.8 lb

## 2017-04-13 DIAGNOSIS — Z0000 Encounter for general adult medical examination without abnormal findings: Secondary | ICD-10-CM

## 2017-04-13 NOTE — Progress Notes (Signed)
Subjective:   Sandra Mckinney is a 71 y.o. female who presents for Medicare Annual (Subsequent) preventive examination.  Review of Systems:  N/A Cardiac Risk Factors include: advanced age (>13men, >79 women);dyslipidemia;obesity (BMI >30kg/m2)     Objective:     Vitals: Pulse 72   Temp (!) 97.5 F (36.4 C) (Oral)   Resp 12   Ht 5\' 2"  (1.575 m)   Wt 179 lb 12.8 oz (81.6 kg)   BMI 32.89 kg/m   Body mass index is 32.89 kg/m.   Tobacco History  Smoking Status  . Former Smoker  . Quit date: 04/13/1974  Smokeless Tobacco  . Never Used     Counseling given: Not Answered   Past Medical History:  Diagnosis Date  . Breast cancer ([place]) 2016  . Fibromyalgia   . Hyperlipidemia   . Major depressive disorder   . Restless leg syndrome   . Vitamin D deficiency    Past Surgical History:  Procedure Laterality Date  . ABDOMINAL HYSTERECTOMY  1986   uterine fibroids and endometriosis  . BREAST BIOPSY Right 06/01/12   [place] bx/clip-neg  . BREAST RECONSTRUCTION Bilateral 2017  . CARPAL TUNNEL RELEASE Right 2014  . EYE SURGERY Bilateral 2009   catarcts  . MASTECTOMY, RADICAL Bilateral 2016  . REDUCTION MAMMAPLASTY     2001  . THUMB ARTHROSCOPY Right 2014   Family History  Problem Relation Age of Onset  . Dementia Mother   . Hypertension Mother   . Heart disease Father 52   History  Sexual Activity  . Sexual activity: Not on file    Outpatient Encounter Prescriptions as of 04/13/2017  Medication Sig  . aspirin-acetaminophen-caffeine (EXCEDRIN MIGRAINE) 250-250-65 MG tablet Take by mouth every 6 (six) hours as needed for headache.  . betamethasone dipropionate (DIPROLENE) 0.05 % cream Apply 1 application topically 2 (two) times daily.  . busPIRone (BUSPAR) 10 MG tablet TAKE 1 TABLET FOUR TIMES DAILY  . Calcium-Magnesium-Vitamin D 600-40-500 MG-MG-UNIT TB24 Take 1 tablet by mouth daily.  . chlorpheniramine (CHLOR-TRIMETON) 4 MG tablet Take 4 mg by mouth  daily.  . cholecalciferol (VITAMIN D) 1000 UNITS tablet Take 1 tablet by mouth daily.  . cloNIDine (CATAPRES) 0.1 MG tablet Take 1 tablet by mouth at bedtime.  . ferrous sulfate 325 (65 FE) MG tablet Take 325 mg by mouth daily with breakfast.  . furosemide (LASIX) 20 MG tablet TAKE 1 TABLET EVERY DAY  . gabapentin (NEURONTIN) 100 MG capsule TAKE 1 CAPSULE THREE TIMES DAILY (Patient taking differently: take 3 capsules three times a day)  . HYDROcodone-acetaminophen (NORCO/VICODIN) 5-325 MG tablet Take 1 tablet by mouth 3 (three) times daily.  . magnesium oxide (MAG-OX) 400 MG tablet Take 400 mg by mouth 2 (two) times daily.  . metaxalone (SKELAXIN) 800 MG tablet Take 1 tablet (800 mg total) by mouth every 8 (eight) hours.  . MULTIPLE VITAMINS-MINERALS PO Take 1 tablet by mouth daily.  . propranolol ER (INDERAL LA) 60 MG 24 hr capsule TAKE 1 CAPSULE EVERY DAY  . rOPINIRole (REQUIP) 0.5 MG tablet TAKE 7 TABLETS AT BEDTIME   No facility-administered encounter medications on file as of 04/13/2017.     Activities of Daily Living In your present state of health, do you have any difficulty performing the following activities: 04/13/2017 04/16/2016  Hearing? N N  Vision? N N  Difficulty concentrating or making decisions? N N  Walking or climbing stairs? N N  Dressing or bathing? N N  Doing  errands, shopping? N N  Preparing Food and eating ? N N  Using the Toilet? N N  In the past six months, have you accidently leaked urine? N N  Do you have problems with loss of bowel control? N N  Managing your Medications? N N  Managing your Finances? N N  Housekeeping or managing your Housekeeping? N N  Some recent data might be hidden    Patient Care Team: Glean Hess, MD as PCP - General (Family Medicine)    Assessment:     Exercise Activities and Dietary recommendations Current Exercise Habits: Home exercise routine, Type of exercise: strength training/weights;stretching, Time (Minutes):  30, Frequency (Times/Week): 7, Weekly Exercise (Minutes/Week): 210, Intensity: Mild  Goals    . Happiness          Continue to be positive and keep family and herself happy while keeping grand kids and staying active.     . Increase water intake          Recommend to cut out sodas and replace with a minimum of 6-8 glasses per day.      Fall Risk Fall Risk  04/13/2017 04/16/2016 12/12/2015 12/06/2014  Falls in the past year? No No Yes No  Number falls in past yr: - - 2 or more -  Injury with Fall? - - Yes -  Risk for fall due to : - - Other (Comment) -   Depression Screen PHQ 2/9 Scores 04/13/2017 03/17/2017 04/16/2016 12/12/2015  PHQ - 2 Score 0 0 0 0  PHQ- 9 Score - 3 - -     Cognitive Function: declined     6CIT Screen 04/16/2016  What Year? 0 points  What month? 0 points  What time? 0 points  Count back from 20 0 points  Months in reverse 0 points  Repeat phrase 0 points  Total Score 0    Immunization History  Administered Date(s) Administered  . Influenza-Unspecified 03/07/2014, 03/07/2016  . Pneumococcal Conjugate-13 03/14/2014, 09/17/2015  . Pneumococcal Polysaccharide-23 04/20/2002, 11/07/2010  . Tdap 11/24/2011  . Zoster 07/07/2008, 10/09/2014   Screening Tests Health Maintenance  Topic Date Due  . INFLUENZA VACCINE  04/17/2017 (Originally 02/04/2017)  . MAMMOGRAM  04/17/2017 (Originally 12/20/2016)  . PNA vac Low Risk Adult (2 of 2 - PPSV23) 04/17/2017 (Originally 09/16/2016)  . TETANUS/TDAP  11/23/2021  . COLONOSCOPY  10/10/2024  . DEXA SCAN  Addressed  . Hepatitis C Screening  Addressed      Plan:    I have personally reviewed and addressed the Medicare Annual Wellness questionnaire and have noted the following in the patient's chart:  A. Medical and social history B. Use of alcohol, tobacco or illicit drugs  C. Current medications and supplements D. Functional ability and status E.  Nutritional status F.  Physical activity G. Advance  directives H. List of other physicians I.  Hospitalizations, surgeries, and ER visits in previous 12 months J.  [place] such as hearing and vision if needed, cognitive and depression L. Referrals and appointments - none  In addition, I have reviewed and discussed with patient certain preventive protocols, quality metrics, and best practice recommendations. A written personalized care plan for preventive services as well as general preventive health recommendations were provided to patient.  See attached scanned questionnaire for additional information.   Signed,  Aleatha Borer, LPN Nurse Health Advisor   MD Recommendations: None. Pt declined mammogram and Pneumovax. Will discuss need for breast MRI with oncologist for breast  implant surveillance.

## 2017-04-13 NOTE — Patient Instructions (Signed)
Sandra Mckinney , Thank you for taking time to come for your Medicare Wellness Visit. I appreciate your ongoing commitment to your health goals. Please review the following plan we discussed and let me know if I can assist you in the future.   Screening recommendations/referrals: Colonoscopy: completed 10/11/14 Mammogram: completed 12/26/14. Will discuss with oncologist about breast prosthesis surveillence Bone Density: completed 04/02/15 @ [facility]. Results in Care Everywhere Recommended yearly ophthalmology/optometry visit for glaucoma screening and checkup Recommended yearly dental visit for hygiene and checkup  Vaccinations: Influenza vaccine: completed. Will provide documentation from pharmacy Pneumococcal vaccine: Pneumovax 23 needed. Will check with pharmacy to determine if she has completed dose. Tdap vaccine: up to date Shingles vaccine: up to date   Advanced directives: Please bring a copy of your POA (Power of [place]) and/or Living Will to your next appointment.   Conditions/risks identified: Recommend to cut out sodas and replace with a minimum of 6-8 glasses per day; Fall risk reduction discussed  Next appointment: Scheduled to see Dr. Army Melia 04/17/17 @ 10:30am. Follow up in one year for annual wellness exam.   Preventive Care 65 Years and Older, Female Preventive care refers to lifestyle choices and visits with your health care provider that can promote health and wellness. What does preventive care include?  A yearly physical exam. This is also called an annual well check.  Dental exams once or twice a year.  Routine eye exams. Ask your health care provider how often you should have your eyes checked.  Personal lifestyle choices, including:  Daily care of your teeth and gums.  Regular physical activity.  Eating a healthy diet.  Avoiding tobacco and drug use.  Limiting alcohol use.  Practicing safe sex.  Taking low-dose aspirin every day.  Taking vitamin and  mineral supplements as recommended by your health care provider. What happens during an annual well check? The services and screenings done by your health care provider during your annual well check will depend on your age, overall health, lifestyle risk factors, and family history of disease. Counseling  Your health care provider may ask you questions about your:  Alcohol use.  Tobacco use.  Drug use.  Emotional well-being.  Home and relationship well-being.  Sexual activity.  Eating habits.  History of falls.  Memory and ability to understand (cognition).  Work and work Statistician.  Reproductive health. Screening  You may have the following tests or measurements:  Height, weight, and BMI.  Blood pressure.  Lipid and cholesterol levels. These may be checked every 5 years, or more frequently if you are over 56 years old.  Skin check.  Lung cancer screening. You may have this screening every year starting at age 74 if you have a 30-pack-year history of smoking and currently smoke or have quit within the past 15 years.  Fecal occult blood test (FOBT) of the stool. You may have this test every year starting at age 65.  Flexible sigmoidoscopy or colonoscopy. You may have a sigmoidoscopy every 5 years or a colonoscopy every 10 years starting at age 44.  Hepatitis C blood test.  Hepatitis B blood test.  Sexually transmitted disease (STD) testing.  Diabetes screening. This is done by checking your blood sugar (glucose) after you have not eaten for a while (fasting). You may have this done every 1-3 years.  Bone density scan. This is done to screen for osteoporosis. You may have this done starting at age 40.  Mammogram. This may be done every 1-2 years.  Talk to your health care provider about how often you should have regular mammograms. Talk with your health care provider about your test results, treatment options, and if necessary, the need for more tests. Vaccines    Your health care provider may recommend certain vaccines, such as:  Influenza vaccine. This is recommended every year.  Tetanus, diphtheria, and acellular pertussis (Tdap, Td) vaccine. You may need a Td booster every 10 years.  Zoster vaccine. You may need this after age 23.  Pneumococcal 13-valent conjugate (PCV13) vaccine. One dose is recommended after age 74.  Pneumococcal polysaccharide (PPSV23) vaccine. One dose is recommended after age 47. Talk to your health care provider about which screenings and vaccines you need and how often you need them. This information is not intended to replace advice given to you by your health care provider. Make sure you discuss any questions you have with your health care provider. Document Released: 07/20/2015 Document Revised: 03/12/2016 Document Reviewed: 04/24/2015 Elsevier Interactive Patient Education  2017 [place] Prevention in the Home Falls can cause injuries. They can happen to people of all ages. There are many things you can do to make your home safe and to help prevent falls. What can I do on the outside of my home?  Regularly fix the edges of walkways and driveways and fix any cracks.  Remove anything that might make you trip as you walk through a door, such as a raised step or threshold.  Trim any bushes or trees on the path to your home.  Use bright outdoor lighting.  Clear any walking paths of anything that might make someone trip, such as rocks or tools.  Regularly check to see if handrails are loose or broken. Make sure that both sides of any steps have handrails.  Any raised decks and porches should have guardrails on the edges.  Have any leaves, snow, or ice cleared regularly.  Use sand or salt on walking paths during winter.  Clean up any spills in your garage right away. This includes oil or grease spills. What can I do in the bathroom?  Use night lights.  Install grab bars by the toilet and in the  tub and shower. Do not use towel bars as grab bars.  Use non-skid mats or decals in the tub or shower.  If you need to sit down in the shower, use a plastic, non-slip stool.  Keep the floor dry. Clean up any water that spills on the floor as soon as it happens.  Remove soap buildup in the tub or shower regularly.  Attach bath mats securely with double-sided non-slip rug tape.  Do not have throw rugs and other things on the floor that can make you trip. What can I do in the bedroom?  Use night lights.  Make sure that you have a light by your bed that is easy to reach.  Do not use any sheets or blankets that are too big for your bed. They should not hang down onto the floor.  Have a firm chair that has side arms. You can use this for support while you get dressed.  Do not have throw rugs and other things on the floor that can make you trip. What can I do in the kitchen?  Clean up any spills right away.  Avoid walking on wet floors.  Keep items that you use a lot in easy-to-reach places.  If you need to reach something above you, use a strong step  stool that has a grab bar.  Keep electrical cords out of the way.  Do not use floor polish or wax that makes floors slippery. If you must use wax, use non-skid floor wax.  Do not have throw rugs and other things on the floor that can make you trip. What can I do with my stairs?  Do not leave any items on the stairs.  Make sure that there are handrails on both sides of the stairs and use them. Fix handrails that are broken or loose. Make sure that handrails are as long as the stairways.  Check any carpeting to make sure that it is firmly attached to the stairs. Fix any carpet that is loose or worn.  Avoid having throw rugs at the top or bottom of the stairs. If you do have throw rugs, attach them to the floor with carpet tape.  Make sure that you have a light switch at the top of the stairs and the bottom of the stairs. If you  do not have them, ask someone to add them for you. What else can I do to help prevent falls?  Wear shoes that:  Do not have high heels.  Have rubber bottoms.  Are comfortable and fit you well.  Are closed at the toe. Do not wear sandals.  If you use a stepladder:  Make sure that it is fully opened. Do not climb a closed stepladder.  Make sure that both sides of the stepladder are locked into place.  Ask someone to hold it for you, if possible.  Clearly mark and make sure that you can see:  Any grab bars or handrails.  First and last steps.  Where the edge of each step is.  Use tools that help you move around (mobility aids) if they are needed. These include:  Canes.  Walkers.  Scooters.  Crutches.  Turn on the lights when you go into a dark area. Replace any light bulbs as soon as they burn out.  Set up your furniture so you have a clear path. Avoid moving your furniture around.  If any of your floors are uneven, fix them.  If there are any pets around you, be aware of where they are.  Review your medicines with your doctor. Some medicines can make you feel dizzy. This can increase your chance of falling. Ask your doctor what other things that you can do to help prevent falls. This information is not intended to replace advice given to you by your health care provider. Make sure you discuss any questions you have with your health care provider. Document Released: 04/19/2009 Document Revised: 11/29/2015 Document Reviewed: 07/28/2014 Elsevier Interactive Patient Education  2017 [employer].

## 2017-04-17 ENCOUNTER — Encounter: Payer: Medicare PPO | Admitting: Internal Medicine

## 2017-05-07 ENCOUNTER — Other Ambulatory Visit: Payer: Self-pay | Admitting: Internal Medicine

## 2017-05-07 ENCOUNTER — Telehealth: Payer: Self-pay

## 2017-05-07 DIAGNOSIS — M797 Fibromyalgia: Secondary | ICD-10-CM

## 2017-05-07 MED ORDER — HYDROCODONE-ACETAMINOPHEN 5-325 MG PO TABS: 1.0000 | ORAL_TABLET | Freq: Three times a day (TID) | ORAL | 0 refills | Status: DC

## 2017-05-07 NOTE — Telephone Encounter (Signed)
Patient called requesting refill on Hydrocodone. Wants to pick up this afternoon instead of in the morning. Please Advise.

## 2017-06-05 ENCOUNTER — Other Ambulatory Visit: Payer: Self-pay | Admitting: Internal Medicine

## 2017-06-05 DIAGNOSIS — M797 Fibromyalgia: Secondary | ICD-10-CM

## 2017-06-05 MED ORDER — HYDROCODONE-ACETAMINOPHEN 5-325 MG PO TABS: 1.0000 | ORAL_TABLET | Freq: Three times a day (TID) | ORAL | 0 refills | Status: DC

## 2017-07-03 ENCOUNTER — Other Ambulatory Visit: Payer: Self-pay | Admitting: Internal Medicine

## 2017-07-03 ENCOUNTER — Telehealth: Payer: Self-pay

## 2017-07-03 DIAGNOSIS — M797 Fibromyalgia: Secondary | ICD-10-CM

## 2017-07-03 MED ORDER — HYDROCODONE-ACETAMINOPHEN 5-325 MG PO TABS: 1.0000 | ORAL_TABLET | Freq: Three times a day (TID) | ORAL | 0 refills | Status: DC

## 2017-07-03 NOTE — Telephone Encounter (Signed)
She will need to pick up the Rx today. We are closed on Saturdays.

## 2017-07-03 NOTE — Telephone Encounter (Signed)
Patient called requesting refill on hyrdocodone medication. Wants to pick up tomorrow.   Thanks.

## 2017-07-03 NOTE — Telephone Encounter (Signed)
Patient informed to pick up today.

## 2017-07-13 DIAGNOSIS — Z9013 Acquired absence of bilateral breasts and nipples: Secondary | ICD-10-CM | POA: Diagnosis not present

## 2017-07-15 DIAGNOSIS — Z87891 Personal history of nicotine dependence: Secondary | ICD-10-CM | POA: Diagnosis not present

## 2017-07-15 DIAGNOSIS — Z170 Estrogen receptor positive status [ER+]: Secondary | ICD-10-CM | POA: Diagnosis not present

## 2017-07-15 DIAGNOSIS — C50919 Malignant neoplasm of unspecified site of unspecified female breast: Secondary | ICD-10-CM | POA: Diagnosis not present

## 2017-07-15 DIAGNOSIS — R52 Pain, unspecified: Secondary | ICD-10-CM | POA: Diagnosis not present

## 2017-07-15 DIAGNOSIS — Z08 Encounter for follow-up examination after completed treatment for malignant neoplasm: Secondary | ICD-10-CM | POA: Diagnosis not present

## 2017-07-15 DIAGNOSIS — Z853 Personal history of malignant neoplasm of breast: Secondary | ICD-10-CM | POA: Diagnosis not present

## 2017-07-15 DIAGNOSIS — R1031 Right lower quadrant pain: Secondary | ICD-10-CM | POA: Diagnosis not present

## 2017-07-15 DIAGNOSIS — Z9013 Acquired absence of bilateral breasts and nipples: Secondary | ICD-10-CM | POA: Diagnosis not present

## 2017-07-26 ENCOUNTER — Other Ambulatory Visit: Payer: Self-pay | Admitting: Internal Medicine

## 2017-07-28 ENCOUNTER — Telehealth: Payer: Self-pay

## 2017-07-28 NOTE — Telephone Encounter (Signed)
Called [facility] and did prior authorization for Mataxalone 800 mg tablets- take 1 tablet by mouth every 8 hours. 270 tablets for a 90 day supply. They will respond with a fax within 24-72 hours.   Other options for medications is: Tizanadine- patient failed and has tried. Baclofen: Has not tried when looking at history of meds.   REFERENCE# [id number]

## 2017-08-03 ENCOUNTER — Other Ambulatory Visit: Payer: Self-pay | Admitting: Internal Medicine

## 2017-08-03 DIAGNOSIS — M797 Fibromyalgia: Secondary | ICD-10-CM

## 2017-08-03 MED ORDER — HYDROCODONE-ACETAMINOPHEN 5-325 MG PO TABS: 1.0000 | ORAL_TABLET | Freq: Three times a day (TID) | ORAL | 0 refills | Status: DC

## 2017-09-01 ENCOUNTER — Other Ambulatory Visit: Payer: Self-pay | Admitting: Internal Medicine

## 2017-09-01 DIAGNOSIS — M797 Fibromyalgia: Secondary | ICD-10-CM

## 2017-09-01 MED ORDER — HYDROCODONE-ACETAMINOPHEN 5-325 MG PO TABS: 1.0000 | ORAL_TABLET | Freq: Three times a day (TID) | ORAL | 0 refills | Status: DC

## 2017-09-02 DIAGNOSIS — D225 Melanocytic nevi of trunk: Secondary | ICD-10-CM | POA: Diagnosis not present

## 2017-09-02 DIAGNOSIS — D485 Neoplasm of uncertain behavior of skin: Secondary | ICD-10-CM | POA: Diagnosis not present

## 2017-09-02 DIAGNOSIS — L988 Other specified disorders of the skin and subcutaneous tissue: Secondary | ICD-10-CM | POA: Diagnosis not present

## 2017-09-02 DIAGNOSIS — L578 Other skin changes due to chronic exposure to nonionizing radiation: Secondary | ICD-10-CM | POA: Diagnosis not present

## 2017-09-08 ENCOUNTER — Other Ambulatory Visit: Payer: Self-pay | Admitting: Internal Medicine

## 2017-09-08 DIAGNOSIS — G2581 Restless legs syndrome: Secondary | ICD-10-CM

## 2017-09-17 DIAGNOSIS — D2262 Melanocytic nevi of left upper limb, including shoulder: Secondary | ICD-10-CM | POA: Diagnosis not present

## 2017-09-17 DIAGNOSIS — D485 Neoplasm of uncertain behavior of skin: Secondary | ICD-10-CM | POA: Diagnosis not present

## 2017-09-28 DIAGNOSIS — D2262 Melanocytic nevi of left upper limb, including shoulder: Secondary | ICD-10-CM | POA: Diagnosis not present

## 2017-09-28 DIAGNOSIS — D485 Neoplasm of uncertain behavior of skin: Secondary | ICD-10-CM | POA: Diagnosis not present

## 2017-09-30 ENCOUNTER — Telehealth: Payer: Self-pay

## 2017-09-30 ENCOUNTER — Other Ambulatory Visit: Payer: Self-pay | Admitting: Internal Medicine

## 2017-09-30 DIAGNOSIS — M797 Fibromyalgia: Secondary | ICD-10-CM

## 2017-09-30 MED ORDER — HYDROCODONE-ACETAMINOPHEN 5-325 MG PO TABS: 1.0000 | ORAL_TABLET | Freq: Three times a day (TID) | ORAL | 0 refills | Status: DC

## 2017-09-30 NOTE — Telephone Encounter (Signed)
Patient called to request refill on hydrocodone. Would like to pick up tomorrow. Please Advise.

## 2017-10-02 DIAGNOSIS — L600 Ingrowing nail: Secondary | ICD-10-CM | POA: Diagnosis not present

## 2017-10-02 DIAGNOSIS — B351 Tinea unguium: Secondary | ICD-10-CM | POA: Diagnosis not present

## 2017-10-02 DIAGNOSIS — M79675 Pain in left toe(s): Secondary | ICD-10-CM | POA: Diagnosis not present

## 2017-10-02 DIAGNOSIS — M79674 Pain in right toe(s): Secondary | ICD-10-CM | POA: Diagnosis not present

## 2017-10-02 DIAGNOSIS — L03031 Cellulitis of right toe: Secondary | ICD-10-CM | POA: Diagnosis not present

## 2017-10-02 DIAGNOSIS — L03032 Cellulitis of left toe: Secondary | ICD-10-CM | POA: Diagnosis not present

## 2017-10-17 ENCOUNTER — Other Ambulatory Visit: Payer: Self-pay | Admitting: Internal Medicine

## 2017-10-30 ENCOUNTER — Other Ambulatory Visit: Payer: Self-pay | Admitting: Internal Medicine

## 2017-10-30 ENCOUNTER — Telehealth: Payer: Self-pay | Admitting: Internal Medicine

## 2017-10-30 DIAGNOSIS — M797 Fibromyalgia: Secondary | ICD-10-CM

## 2017-10-30 MED ORDER — HYDROCODONE-ACETAMINOPHEN 5-325 MG PO TABS: 1.0000 | ORAL_TABLET | Freq: Three times a day (TID) | ORAL | 0 refills | Status: DC

## 2017-10-30 NOTE — Telephone Encounter (Signed)
[facility] [place]   HYDROcodone-acetaminophen (NORCO/VICODIN) 5-325 MG tablet [[id number]]

## 2017-11-09 ENCOUNTER — Other Ambulatory Visit: Payer: Self-pay | Admitting: Internal Medicine

## 2017-11-16 ENCOUNTER — Telehealth: Payer: Self-pay

## 2017-11-16 NOTE — Telephone Encounter (Signed)
AWV w/ [facility] scheduled 04/21/18

## 2017-11-27 ENCOUNTER — Other Ambulatory Visit: Payer: Self-pay | Admitting: Internal Medicine

## 2017-11-27 ENCOUNTER — Telehealth: Payer: Self-pay | Admitting: Internal Medicine

## 2017-11-27 DIAGNOSIS — M797 Fibromyalgia: Secondary | ICD-10-CM

## 2017-11-27 MED ORDER — HYDROCODONE-ACETAMINOPHEN 5-325 MG PO TABS: 1.0000 | ORAL_TABLET | Freq: Three times a day (TID) | ORAL | 0 refills | Status: DC

## 2017-11-27 NOTE — Telephone Encounter (Signed)
Patient requesting refill on controlled substance. Please see previous msg.

## 2017-11-27 NOTE — Telephone Encounter (Signed)
Patient called this am she's completely out of HYDROcodone-acetaminophen (NORCO/VICODIN) 5-325 MG tablet [[id number]]  Pharmacy:  [id number] [place], [place] DEA #:  [phone]

## 2017-12-04 ENCOUNTER — Other Ambulatory Visit: Payer: Self-pay | Admitting: Internal Medicine

## 2017-12-25 ENCOUNTER — Telehealth: Payer: Self-pay

## 2017-12-25 NOTE — Telephone Encounter (Signed)
Patient called today to pick up pain meds today. I explained PCP is on vacation and will be back Wed. She was disappointed and I asked how many do you have left? She said she does not count them but she is able to fill 3 days early usually. I told her that I will make sure PCP gets this first thing Wed and I advised her to check with pharmacy around 10 am Wed and call Roselyn Reef if not there.

## 2017-12-29 ENCOUNTER — Other Ambulatory Visit: Payer: Self-pay | Admitting: Internal Medicine

## 2017-12-29 DIAGNOSIS — M797 Fibromyalgia: Secondary | ICD-10-CM

## 2017-12-29 MED ORDER — HYDROCODONE-ACETAMINOPHEN 5-325 MG PO TABS: 1.0000 | ORAL_TABLET | Freq: Three times a day (TID) | ORAL | 0 refills | Status: DC

## 2017-12-29 NOTE — Telephone Encounter (Signed)
Left message to call pharmacy

## 2017-12-29 NOTE — Telephone Encounter (Signed)
You can let the patient know that I just did the prescription.

## 2018-01-13 DIAGNOSIS — M810 Age-related osteoporosis without current pathological fracture: Secondary | ICD-10-CM | POA: Diagnosis not present

## 2018-01-13 DIAGNOSIS — C50412 Malignant neoplasm of upper-outer quadrant of left female breast: Secondary | ICD-10-CM | POA: Diagnosis not present

## 2018-01-13 DIAGNOSIS — Z170 Estrogen receptor positive status [ER+]: Secondary | ICD-10-CM | POA: Diagnosis not present

## 2018-01-13 DIAGNOSIS — K8020 Calculus of gallbladder without cholecystitis without obstruction: Secondary | ICD-10-CM | POA: Diagnosis not present

## 2018-01-13 DIAGNOSIS — H259 Unspecified age-related cataract: Secondary | ICD-10-CM | POA: Diagnosis not present

## 2018-01-25 ENCOUNTER — Other Ambulatory Visit: Payer: Self-pay | Admitting: Internal Medicine

## 2018-01-25 ENCOUNTER — Telehealth: Payer: Self-pay

## 2018-01-25 DIAGNOSIS — M797 Fibromyalgia: Secondary | ICD-10-CM

## 2018-01-25 MED ORDER — HYDROCODONE-ACETAMINOPHEN 5-325 MG PO TABS: 1.0000 | ORAL_TABLET | Freq: Three times a day (TID) | ORAL | 0 refills | Status: DC

## 2018-01-25 NOTE — Telephone Encounter (Signed)
Patient called stating she would like [facility] [place].   Please Advise.

## 2018-02-10 DIAGNOSIS — C50412 Malignant neoplasm of upper-outer quadrant of left female breast: Secondary | ICD-10-CM | POA: Diagnosis not present

## 2018-02-10 DIAGNOSIS — Z170 Estrogen receptor positive status [ER+]: Secondary | ICD-10-CM | POA: Diagnosis not present

## 2018-02-10 DIAGNOSIS — R2241 Localized swelling, mass and lump, right lower limb: Secondary | ICD-10-CM | POA: Diagnosis not present

## 2018-02-10 DIAGNOSIS — M25461 Effusion, right knee: Secondary | ICD-10-CM | POA: Diagnosis not present

## 2018-02-10 DIAGNOSIS — M25462 Effusion, left knee: Secondary | ICD-10-CM | POA: Diagnosis not present

## 2018-02-24 ENCOUNTER — Other Ambulatory Visit: Payer: Self-pay | Admitting: Internal Medicine

## 2018-02-24 ENCOUNTER — Telehealth: Payer: Self-pay

## 2018-02-24 DIAGNOSIS — M797 Fibromyalgia: Secondary | ICD-10-CM

## 2018-02-24 MED ORDER — HYDROCODONE-ACETAMINOPHEN 5-325 MG PO TABS: 1.0000 | ORAL_TABLET | Freq: Three times a day (TID) | ORAL | 0 refills | Status: DC

## 2018-02-24 NOTE — Telephone Encounter (Signed)
Patient called to request refill on hydrocodone medication. [facility] would [facility] [place].   Please Advise.

## 2018-03-02 DIAGNOSIS — L821 Other seborrheic keratosis: Secondary | ICD-10-CM | POA: Diagnosis not present

## 2018-03-02 DIAGNOSIS — X32XXXS Exposure to sunlight, sequela: Secondary | ICD-10-CM | POA: Diagnosis not present

## 2018-03-02 DIAGNOSIS — L578 Other skin changes due to chronic exposure to nonionizing radiation: Secondary | ICD-10-CM | POA: Diagnosis not present

## 2018-03-02 DIAGNOSIS — L570 Actinic keratosis: Secondary | ICD-10-CM | POA: Diagnosis not present

## 2018-03-02 DIAGNOSIS — Z86018 Personal history of other benign neoplasm: Secondary | ICD-10-CM | POA: Diagnosis not present

## 2018-03-02 DIAGNOSIS — Z1283 Encounter for screening for malignant neoplasm of skin: Secondary | ICD-10-CM | POA: Diagnosis not present

## 2018-03-02 DIAGNOSIS — L568 Other specified acute skin changes due to ultraviolet radiation: Secondary | ICD-10-CM | POA: Diagnosis not present

## 2018-03-26 ENCOUNTER — Other Ambulatory Visit: Payer: Self-pay | Admitting: Internal Medicine

## 2018-03-26 ENCOUNTER — Telehealth: Payer: Self-pay

## 2018-03-26 DIAGNOSIS — M797 Fibromyalgia: Secondary | ICD-10-CM

## 2018-03-26 MED ORDER — HYDROCODONE-ACETAMINOPHEN 5-325 MG PO TABS: 1.0000 | ORAL_TABLET | Freq: Three times a day (TID) | ORAL | 0 refills | Status: DC

## 2018-03-26 NOTE — Telephone Encounter (Signed)
Patient called to request REFILL on Hydrocodone medication.   [facility]: [facility] [place]  Please Advise.

## 2018-04-14 ENCOUNTER — Ambulatory Visit (INDEPENDENT_AMBULATORY_CARE_PROVIDER_SITE_OTHER): Payer: Medicare PPO

## 2018-04-14 VITALS — BP 122/70 | HR 59 | Temp 97.9°F | Ht 62.0 in | Wt 164.0 lb

## 2018-04-14 DIAGNOSIS — Z0000 Encounter for general adult medical examination without abnormal findings: Secondary | ICD-10-CM | POA: Diagnosis not present

## 2018-04-14 DIAGNOSIS — Z23 Encounter for immunization: Secondary | ICD-10-CM | POA: Diagnosis not present

## 2018-04-14 NOTE — Patient Instructions (Addendum)
Sandra Mckinney , Thank you for taking time to come for your Medicare Wellness Visit. I appreciate your ongoing commitment to your health goals. Please review the following plan we discussed and let me know if I can assist you in the future.   Screening recommendations/referrals: Colorectal Screening: Up to date Mammogram: Please discuss with your plastic surgeon about MRI screenings for your silicone implants Bone Density: Please keep your appointment as scheduled   Vision and Dental Exams: Recommended annual ophthalmology exams for early detection of glaucoma and other disorders of the eye Recommended annual dental exams for proper oral hygiene  Vaccinations: Influenza vaccine: Up to date Pneumococcal vaccine: Completed today Tdap vaccine: Up to date Shingles vaccine: Please call your insurance company to determine your out of pocket expense for the Shingrix vaccine. You may receive this vaccine at your local pharmacy.  Advanced directives: Please bring a copy of your POA (Power of Attorney) and/or Living Will to your next appointment.  Goals: Recommend to eat 3 small healthy meals and at least 2 healthy snacks per day.  Next appointment: Please schedule your Annual Wellness Visit with your Nurse Health Advisor in one year.  Preventive Care 70 Years and Older, Female Preventive care refers to lifestyle choices and visits with your health care provider that can promote health and wellness. What does preventive care include?  A yearly physical exam. This is also called an annual well check.  Dental exams once or twice a year.  Routine eye exams. Ask your health care provider how often you should have your eyes checked.  Personal lifestyle choices, including:  Daily care of your teeth and gums.  Regular physical activity.  Eating a healthy diet.  Avoiding tobacco and drug use.  Limiting alcohol use.  Practicing safe sex.  Taking low-dose aspirin every day if  recommended by your health care provider.  Taking vitamin and mineral supplements as recommended by your health care provider. What happens during an annual well check? The services and screenings done by your health care provider during your annual well check will depend on your age, overall health, lifestyle risk factors, and family history of disease. Counseling  Your health care provider may ask you questions about your:  Alcohol use.  Tobacco use.  Drug use.  Emotional well-being.  Home and relationship well-being.  Sexual activity.  Eating habits.  History of falls.  Memory and ability to understand (cognition).  Work and work Statistician.  Reproductive health. Screening  You may have the following tests or measurements:  Height, weight, and BMI.  Blood pressure.  Lipid and cholesterol levels. These may be checked every 5 years, or more frequently if you are over 27 years old.  Skin check.  Lung cancer screening. You may have this screening every year starting at age 53 if you have a 30-pack-year history of smoking and currently smoke or have quit within the past 15 years.  Fecal occult blood test (FOBT) of the stool. You may have this test every year starting at age 18.  Flexible sigmoidoscopy or colonoscopy. You may have a sigmoidoscopy every 5 years or a colonoscopy every 10 years starting at age 63.  Hepatitis C blood test.  Hepatitis B blood test.  Sexually transmitted disease (STD) testing.  Diabetes screening. This is done by checking your blood sugar (glucose) after you have not eaten for a while (fasting). You may have this done every 1-3 years.  Bone density scan. This is done to screen for osteoporosis.  You may have this done starting at age 43.  Mammogram. This may be done every 1-2 years. Talk to your health care provider about how often you should have regular mammograms. Talk with your health care provider about your test results,  treatment options, and if necessary, the need for more tests. Vaccines  Your health care provider may recommend certain vaccines, such as:  Influenza vaccine. This is recommended every year.  Tetanus, diphtheria, and acellular pertussis (Tdap, Td) vaccine. You may need a Td booster every 10 years.  Zoster vaccine. You may need this after age 40.  Pneumococcal 13-valent conjugate (PCV13) vaccine. One dose is recommended after age 52.  Pneumococcal polysaccharide (PPSV23) vaccine. One dose is recommended after age 61. Talk to your health care provider about which screenings and vaccines you need and how often you need them. This information is not intended to replace advice given to you by your health care provider. Make sure you discuss any questions you have with your health care provider. Document Released: 07/20/2015 Document Revised: 03/12/2016 Document Reviewed: 04/24/2015 Elsevier Interactive Patient Education  2017 [place] Prevention in the Home Falls can cause injuries. They can happen to people of all ages. There are many things you can do to make your home safe and to help prevent falls. What can I do on the outside of my home?  Regularly fix the edges of walkways and driveways and fix any cracks.  Remove anything that might make you trip as you walk through a door, such as a raised step or threshold.  Trim any bushes or trees on the path to your home.  Use bright outdoor lighting.  Clear any walking paths of anything that might make someone trip, such as rocks or tools.  Regularly check to see if handrails are loose or broken. Make sure that both sides of any steps have handrails.  Any raised decks and porches should have guardrails on the edges.  Have any leaves, snow, or ice cleared regularly.  Use sand or salt on walking paths during winter.  Clean up any spills in your garage right away. This includes oil or grease spills. What can I do in the  bathroom?  Use night lights.  Install grab bars by the toilet and in the tub and shower. Do not use towel bars as grab bars.  Use non-skid mats or decals in the tub or shower.  If you need to sit down in the shower, use a plastic, non-slip stool.  Keep the floor dry. Clean up any water that spills on the floor as soon as it happens.  Remove soap buildup in the tub or shower regularly.  Attach bath mats securely with double-sided non-slip rug tape.  Do not have throw rugs and other things on the floor that can make you trip. What can I do in the bedroom?  Use night lights.  Make sure that you have a light by your bed that is easy to reach.  Do not use any sheets or blankets that are too big for your bed. They should not hang down onto the floor.  Have a firm chair that has side arms. You can use this for support while you get dressed.  Do not have throw rugs and other things on the floor that can make you trip. What can I do in the kitchen?  Clean up any spills right away.  Avoid walking on wet floors.  Keep items that you use a  lot in easy-to-reach places.  If you need to reach something above you, use a strong step stool that has a grab bar.  Keep electrical cords out of the way.  Do not use floor polish or wax that makes floors slippery. If you must use wax, use non-skid floor wax.  Do not have throw rugs and other things on the floor that can make you trip. What can I do with my stairs?  Do not leave any items on the stairs.  Make sure that there are handrails on both sides of the stairs and use them. Fix handrails that are broken or loose. Make sure that handrails are as long as the stairways.  Check any carpeting to make sure that it is firmly attached to the stairs. Fix any carpet that is loose or worn.  Avoid having throw rugs at the top or bottom of the stairs. If you do have throw rugs, attach them to the floor with carpet tape.  Make sure that you have a  light switch at the top of the stairs and the bottom of the stairs. If you do not have them, ask someone to add them for you. What else can I do to help prevent falls?  Wear shoes that:  Do not have high heels.  Have rubber bottoms.  Are comfortable and fit you well.  Are closed at the toe. Do not wear sandals.  If you use a stepladder:  Make sure that it is fully opened. Do not climb a closed stepladder.  Make sure that both sides of the stepladder are locked into place.  Ask someone to hold it for you, if possible.  Clearly mark and make sure that you can see:  Any grab bars or handrails.  First and last steps.  Where the edge of each step is.  Use tools that help you move around (mobility aids) if they are needed. These include:  Canes.  Walkers.  Scooters.  Crutches.  Turn on the lights when you go into a dark area. Replace any light bulbs as soon as they burn out.  Set up your furniture so you have a clear path. Avoid moving your furniture around.  If any of your floors are uneven, fix them.  If there are any pets around you, be aware of where they are.  Review your medicines with your doctor. Some medicines can make you feel dizzy. This can increase your chance of falling. Ask your doctor what other things that you can do to help prevent falls. This information is not intended to replace advice given to you by your health care provider. Make sure you discuss any questions you have with your health care provider. Document Released: 04/19/2009 Document Revised: 11/29/2015 Document Reviewed: 07/28/2014 Elsevier Interactive Patient Education  2017 [employer].

## 2018-04-14 NOTE — Progress Notes (Signed)
Subjective:   Sandra Mckinney is a 72 y.o. female who presents for Medicare Annual (Subsequent) preventive examination.  Review of Systems:  N/A Cardiac Risk Factors include: advanced age (>54men, >17 women);dyslipidemia;sedentary lifestyle;obesity (BMI >30kg/m2)     Objective:     Vitals: BP 122/70 (BP Location: Right Leg, Patient Position: Supine, Cuff Size: Normal)   Pulse (!) 59   Temp 97.9 F (36.6 C) (Oral)   Ht 5\' 2"  (1.575 m)   Wt 164 lb (74.4 kg)   SpO2 98%   BMI 30.00 kg/m   Body mass index is 30 kg/m.  Advanced Directives 04/14/2018 04/13/2017 04/16/2016 10/10/2015  Does Patient Have a Medical Advance Directive? Yes Yes No;Yes Yes  Type of Paramedic of [place];Living will [place];Living will - Living will  Copy of [place] in Chart? No - copy requested No - copy requested - -    Tobacco Social History   Tobacco Use  Smoking Status Former Smoker  . Packs/day: 0.75  . Years: 10.00  . Pack years: 7.50  . Types: Cigarettes  . Last attempt to quit: 04/13/1974  . Years since quitting: 44.0  Smokeless Tobacco Never Used  Tobacco Comment   smoking cessation materials not required     Counseling given: No Comment: smoking cessation materials not required  Clinical Intake:  Pre-visit preparation completed: Yes  Pain : No/denies pain   BMI - recorded: 30 Nutritional Status: BMI > 30  Obese Nutritional Risks: None Diabetes: No  How often do you need to have someone help you when you read instructions, pamphlets, or other written materials from your doctor or pharmacy?: 1 - Never  Interpreter Needed?: No  Information entered by :: AEversole, LPN  Past Medical History:  Diagnosis Date  . Breast cancer ([place]) 2016  . Fibromyalgia   . Hyperlipidemia   . Major depressive disorder   . Restless leg syndrome   . Vitamin D deficiency    Past Surgical History:  Procedure  Laterality Date  . ABDOMINAL HYSTERECTOMY  1986   uterine fibroids and endometriosis  . BREAST BIOPSY Right 06/01/12   [place] bx/clip-neg  . BREAST RECONSTRUCTION Bilateral 2017  . CARPAL TUNNEL RELEASE Right 2014  . EYE SURGERY Bilateral 2009   catarcts  . MASTECTOMY, RADICAL Bilateral 2016  . REDUCTION MAMMAPLASTY     2001  . THUMB ARTHROSCOPY Right 2014   Family History  Problem Relation Age of Onset  . Dementia Mother   . Hypertension Mother   . Heart disease Father 74   Social History   Socioeconomic History  . Marital status: Married    Spouse name: Not on file  . Number of children: 2  . Years of education: some college  . Highest education level: 12th grade  Occupational History  . Occupation: Retired  Scientific laboratory technician  . Financial resource strain: Not hard at all  . Food insecurity:    Worry: Never true    Inability: Never true  . Transportation needs:    Medical: No    Non-medical: No  Tobacco Use  . Smoking status: Former Smoker    Packs/day: 0.75    Years: 10.00    Pack years: 7.50    Types: Cigarettes    Last attempt to quit: 04/13/1974    Years since quitting: 44.0  . Smokeless tobacco: Never Used  . Tobacco comment: smoking cessation materials not required  Substance and Sexual Activity  .  Alcohol use: No    Alcohol/week: 0.0 standard drinks  . Drug use: No  . Sexual activity: Not Currently  Lifestyle  . Physical activity:    Days per week: 0 days    Minutes per session: 0 min  . Stress: Not at all  Relationships  . Social connections:    Talks on phone: Patient refused    Gets together: Patient refused    Attends religious service: Patient refused    Active member of club or organization: Patient refused    Attends meetings of clubs or organizations: Patient refused    Relationship status: Married  Other Topics Concern  . Not on file  Social History Narrative  . Not on file    Outpatient Encounter Medications as of 04/14/2018  Medication  Sig  . aspirin-acetaminophen-caffeine (EXCEDRIN MIGRAINE) 250-250-65 MG tablet Take by mouth every 6 (six) hours as needed for headache.  . betamethasone dipropionate (DIPROLENE) 0.05 % cream Apply 1 application topically 2 (two) times daily.  . busPIRone (BUSPAR) 10 MG tablet TAKE 1 TABLET FOUR TIMES DAILY  . Calcium-Magnesium-Vitamin D 600-40-500 MG-MG-UNIT TB24 Take 1 tablet by mouth daily.  . chlorpheniramine (CHLOR-TRIMETON) 4 MG tablet Take 4 mg by mouth daily.  . cholecalciferol (VITAMIN D) 1000 UNITS tablet Take 1 tablet by mouth daily.  . cloNIDine (CATAPRES) 0.1 MG tablet Take 1 tablet by mouth at bedtime.  . ferrous sulfate 325 (65 FE) MG tablet Take 325 mg by mouth daily with breakfast.  . furosemide (LASIX) 20 MG tablet TAKE 1 TABLET EVERY DAY  . gabapentin (NEURONTIN) 100 MG capsule TAKE 1 CAPSULE THREE TIMES DAILY (Patient taking differently: take 3 capsules three times a day)  . HYDROcodone-acetaminophen (NORCO/VICODIN) 5-325 MG tablet Take 1 tablet by mouth 3 (three) times daily.  . magnesium oxide (MAG-OX) 400 MG tablet Take 400 mg by mouth 2 (two) times daily.  . metaxalone (SKELAXIN) 800 MG tablet TAKE 1 TABLET (800 MG TOTAL) BY MOUTH EVERY 8 (EIGHT) HOURS.  Marland Kitchen MULTIPLE VITAMINS-MINERALS PO Take 1 tablet by mouth daily.  . propranolol ER (INDERAL LA) 60 MG 24 hr capsule TAKE 1 CAPSULE EVERY DAY  . rOPINIRole (REQUIP) 0.5 MG tablet TAKE 7 TABLETS AT BEDTIME   No facility-administered encounter medications on file as of 04/14/2018.     Activities of Daily Living In your present state of health, do you have any difficulty performing the following activities: 04/14/2018  Hearing? N  Comment denies hearing aids  Vision? N  Comment denies eyeglasses; h/o cataract removal  Difficulty concentrating or making decisions? N  Walking or climbing stairs? N  Dressing or bathing? N  Doing errands, shopping? N  Preparing Food and eating ? N  Comment denies dentures  Using the  Toilet? N  In the past six months, have you accidently leaked urine? N  Do you have problems with loss of bowel control? N  Managing your Medications? N  Managing your Finances? N  Housekeeping or managing your Housekeeping? N  Some recent data might be hidden    Patient Care Team: Glean Hess, MD as PCP - General (Family Medicine) Su Grand, MD as Consulting Physician (Internal Medicine) Marcial Pacas, MD as Consulting Physician (Plastic Surgery)    Assessment:   This is a routine wellness examination for [facility].  Exercise Activities and Dietary recommendations Current Exercise Habits: The patient does not participate in regular exercise at present, Exercise limited by: None identified  Goals    . DIET -  EAT MORE FRUITS AND VEGETABLES     Recommend to eat 3 small healthy meals and at least 2 healthy snacks per day.    . Happiness     Continue to be positive and keep family and herself happy while keeping grand kids and staying active.     . Increase water intake     Recommend to cut out sodas and replace with a minimum of 6-8 glasses per day.       Fall Risk Fall Risk  04/14/2018 04/13/2017 04/16/2016 12/12/2015 12/06/2014  Falls in the past year? No No No Yes No  Number falls in past yr: - - - 2 or more -  Injury with Fall? - - - Yes -  Risk for fall due to : Medication side effect - - Other (Comment) -   FALL RISK PREVENTION PERTAINING TO THE HOME:  Any stairs in or around the home WITH handrails? Yes  Home free of loose throw rugs in walkways, pet beds, electrical cords, etc? Yes  Adequate lighting in your home to reduce risk of falls? Yes   ASSISTIVE DEVICES UTILIZED TO PREVENT FALLS:   Life alert? No  Use of a cane, walker or w/c? No  Grab bars in the bathroom? No  Shower chair or bench in shower? Yes  Elevated toilet seat or a handicapped toilet? Yes   DME ORDERS:  DME order needed?  No   TIMED UP AND GO:  Was the test performed? Yes .    Length of time to ambulate 10 feet: 5 sec.   GAIT:  Appearance of gait: Gait stead-fast and without the use of an assistive device.  Education: Fall risk prevention has been discussed.  Intervention(s) required? No   Depression Screen PHQ 2/9 Scores 04/14/2018 04/13/2017 03/17/2017 04/16/2016  PHQ - 2 Score 0 0 0 0  PHQ- 9 Score 0 - 3 -     Cognitive Function     6CIT Screen 04/14/2018 04/16/2016  What Year? 0 points 0 points  What month? 0 points 0 points  What time? 0 points 0 points  Count back from 20 0 points 0 points  Months in reverse 0 points 0 points  Repeat phrase 0 points 0 points  Total Score 0 0    Immunization History  Administered Date(s) Administered  . Influenza, High Dose Seasonal PF 03/05/2017, 03/11/2018  . Influenza-Unspecified 03/07/2014, 03/07/2016, 02/03/2017  . Pneumococcal Conjugate-13 03/14/2014, 09/17/2015  . Pneumococcal Polysaccharide-23 04/18/2002, 11/07/2010, 04/14/2018  . Tdap 11/24/2011  . Zoster 07/07/2008, 10/09/2014    Qualifies for Shingles Vaccine? Yes  Zostavax completed 10/09/14. Due for Shingrix. Education has been provided regarding the importance of this vaccine. Pt has been advised to call insurance company to determine out of pocket expense. Advised may also receive vaccine at local pharmacy or Health Dept. Verbalized acceptance and understanding.  Pneumococcal Vaccine: Due for Pneumococcal vaccine. Does the patient want to receive this vaccine today?  Yes .  Screening Tests Health Maintenance  Topic Date Due  . TETANUS/TDAP  11/23/2021  . COLONOSCOPY  10/10/2024  . INFLUENZA VACCINE  Completed  . PNA vac Low Risk Adult  Completed  . DEXA SCAN  Addressed  . Hepatitis C Screening  Addressed  . MAMMOGRAM  Discontinued   Cancer Screenings:  Colorectal Screening: Completed 10/11/14. Repeat every 10 years  Mammogram: No longer required d/t B mastectomy. Does have B silicone implants. Pt has been advised to discuss with plastic  surgeon re: surveillance with  MRI of silicone breast implants  Bone Density: Completed 04/02/15. Results reflect NORMAL. Repeat every 2 years. Scheduled November 2019 @ [facility].   Lung Cancer Screening: (Low Dose CT Chest recommended if Age 16-80 years, 30 pack-year currently smoking OR have quit w/in 15years.) does not qualify.   Additional Screening:  Hepatitis C Screening: Completed 01/09/15  Vision Screening: Recommended annual ophthalmology exams for early detection of glaucoma and other disorders of the eye. Is the patient up to date with their annual eye exam?  Yes  Who is the provider or what is the name of the office in which the pt attends annual eye exams? Dr. Wyatt [place] Dental Screening: Recommended annual dental exams for proper oral hygiene  Community Resource Referral:  CRR required this visit?  No     Plan:  I have personally reviewed and addressed the Medicare Annual Wellness questionnaire and have noted the following in the patient's chart:  A. Medical and social history B. Use of alcohol, tobacco or illicit drugs  C. Current medications and supplements D. Functional ability and status E.  Nutritional status F.  Physical activity G. Advance directives H. List of other physicians I.  Hospitalizations, surgeries, and ER visits in previous 12 months J.  [place] such as hearing and vision if needed, cognitive and depression L. Referrals and appointments  In addition, I have reviewed and discussed with patient certain preventive protocols, quality metrics, and best practice recommendations. A written personalized care plan for preventive services as well as general preventive health recommendations were provided to patient.  Signed,  Aleatha Borer, LPN Nurse Health Advisor  MD Recommendations: Zostavax completed 10/09/14. Due for Shingrix. Education has been provided regarding the importance of this vaccine. Pt has been advised to call insurance company to  determine out of pocket expense. Advised may also receive vaccine at local pharmacy or Health Dept. Verbalized acceptance and understanding.  Mammogram: No longer required d/t B mastectomy. Does have B silicone implants. Pt has been advised to discuss with plastic surgeon re: surveillance with MRI of silicone breast implants  Bone Density: Completed 04/02/15. Results reflect NORMAL. Repeat every 2 years. Scheduled November 2019 @ [facility].

## 2018-04-21 ENCOUNTER — Ambulatory Visit: Payer: Self-pay

## 2018-04-23 ENCOUNTER — Telehealth: Payer: Self-pay

## 2018-04-23 ENCOUNTER — Other Ambulatory Visit: Payer: Self-pay | Admitting: Internal Medicine

## 2018-04-23 DIAGNOSIS — M797 Fibromyalgia: Secondary | ICD-10-CM

## 2018-04-23 MED ORDER — HYDROCODONE-ACETAMINOPHEN 5-325 MG PO TABS: 1.0000 | ORAL_TABLET | Freq: Three times a day (TID) | ORAL | 0 refills | Status: DC

## 2018-04-23 NOTE — Telephone Encounter (Signed)
Patient informed. 

## 2018-04-23 NOTE — Telephone Encounter (Signed)
Patient called to request a refill on hydrocodone medication. Pharmacy is [facility] in [place].  Please Advise.

## 2018-04-23 NOTE — Telephone Encounter (Signed)
Rx sent 

## 2018-05-18 ENCOUNTER — Other Ambulatory Visit: Payer: Self-pay | Admitting: Internal Medicine

## 2018-05-18 DIAGNOSIS — G2581 Restless legs syndrome: Secondary | ICD-10-CM

## 2018-05-21 ENCOUNTER — Telehealth: Payer: Self-pay

## 2018-05-21 ENCOUNTER — Other Ambulatory Visit: Payer: Self-pay | Admitting: Internal Medicine

## 2018-05-21 DIAGNOSIS — M797 Fibromyalgia: Secondary | ICD-10-CM

## 2018-05-21 MED ORDER — HYDROCODONE-ACETAMINOPHEN 5-325 MG PO TABS: 1.0000 | ORAL_TABLET | Freq: Three times a day (TID) | ORAL | 0 refills | Status: DC

## 2018-05-21 NOTE — Telephone Encounter (Signed)
Patient called asking to have her medication refill.  HYDROcodone-acetaminophen (NORCO/VICODIN) 5-325 MG tablet 1 tablet, 3 times daily   Wants [facility] to [id number] - [place], [place].  Please Advise.

## 2018-05-24 ENCOUNTER — Other Ambulatory Visit: Payer: Self-pay | Admitting: Internal Medicine

## 2018-05-24 DIAGNOSIS — G2581 Restless legs syndrome: Secondary | ICD-10-CM

## 2018-06-01 ENCOUNTER — Encounter: Payer: Self-pay | Admitting: Internal Medicine

## 2018-06-01 ENCOUNTER — Ambulatory Visit (INDEPENDENT_AMBULATORY_CARE_PROVIDER_SITE_OTHER): Payer: Medicare PPO | Admitting: Internal Medicine

## 2018-06-01 VITALS — HR 51 | Temp 97.6°F | Ht 62.0 in | Wt 166.0 lb

## 2018-06-01 DIAGNOSIS — J0100 Acute maxillary sinusitis, unspecified: Secondary | ICD-10-CM | POA: Diagnosis not present

## 2018-06-01 MED ORDER — AZITHROMYCIN 250 MG PO TABS: ORAL_TABLET | ORAL | 0 refills | Status: AC

## 2018-06-01 NOTE — Progress Notes (Signed)
Date:  06/01/2018   Name:  Sandra Mckinney   DOB:  1945/07/12   MRN:  [id number]   Chief Complaint: Sore Throat (Started 3 weeks ago. Sinus pressure. Green production. )  Sinusitis  This is a new problem. The current episode started 1 to 4 weeks ago. The problem has been gradually worsening since onset. There has been no fever. The pain is mild. Associated symptoms include congestion, sinus pressure and a sore throat. Pertinent negatives include no chills, coughing, ear pain, headaches, hoarse voice or shortness of breath. Past treatments include oral decongestants. The treatment provided mild relief.    Review of Systems  Constitutional: Negative for chills.  HENT: Positive for congestion, sinus pressure and sore throat. Negative for ear pain and hoarse voice.   Respiratory: Negative for cough, chest tightness and shortness of breath.   Cardiovascular: Negative for chest pain and palpitations.  Gastrointestinal: Negative for diarrhea and nausea.  Allergic/Immunologic: Negative for environmental allergies.  Neurological: Negative for dizziness, light-headedness and headaches.    Patient Active Problem List   Diagnosis Date Noted  . Fibromyalgia 07/29/2016  . Hyperlipidemia 05/20/2016  . Age-related osteoporosis without current pathological fracture 04/16/2016  . Anemia 12/12/2015  . Neoplasm of left breast, primary tumor staging category Tis: ductal carcinoma in situ (DCIS) 01/03/2015  . Neoplasm of right breast, primary tumor staging category Tis: ductal carcinoma in situ (DCIS) 12/27/2014  . Anxiety disorder 10/11/2014  . Plantar fasciitis 10/11/2014  . Local edema 10/11/2014  . Depression, major, single episode, in partial remission ([place]) 10/11/2014  . Hot flash, menopausal 10/11/2014  . Migraine without aura and responsive to treatment 10/11/2014  . Idiopathic insomnia 10/11/2014  . Psoriasis 10/11/2014  . Restless leg 10/11/2014  . Avitaminosis D 10/11/2014      Allergies  Allergen Reactions  . Cephalexin Hives  . Letrozole Swelling  . Raloxifene Hives  . Selective Estrogen Receptor Modulators Hives  . Cephalosporins   . Duloxetine Hcl   . Lyrica [Pregabalin]   . Oxycodone   . Penicillins   . Prilosec  [Omeprazole]   . Sulfa Antibiotics     Past Surgical History:  Procedure Laterality Date  . ABDOMINAL HYSTERECTOMY  1986   uterine fibroids and endometriosis  . BREAST BIOPSY Right 06/01/12   [place] bx/clip-neg  . BREAST RECONSTRUCTION Bilateral 2017  . CARPAL TUNNEL RELEASE Right 2014  . EYE SURGERY Bilateral 2009   catarcts  . MASTECTOMY, RADICAL Bilateral 2016  . REDUCTION MAMMAPLASTY     2001  . THUMB ARTHROSCOPY Right 2014    Social History   Tobacco Use  . Smoking status: Former Smoker    Packs/day: 0.75    Years: 10.00    Pack years: 7.50    Types: Cigarettes    Last attempt to quit: 04/13/1974    Years since quitting: 44.1  . Smokeless tobacco: Never Used  . Tobacco comment: smoking cessation materials not required  Substance Use Topics  . Alcohol use: No    Alcohol/week: 0.0 standard drinks  . Drug use: No     Medication list has been reviewed and updated.  Current Meds  Medication Sig  . aspirin-acetaminophen-caffeine (EXCEDRIN MIGRAINE) 250-539-76 MG tablet Take by mouth every 6 (six) hours as needed for headache.  . betamethasone dipropionate (DIPROLENE) 0.05 % cream Apply 1 application topically 2 (two) times daily.  . busPIRone (BUSPAR) 10 MG tablet TAKE 1 TABLET FOUR TIMES DAILY  . Calcium-Magnesium-Vitamin D 734-19-379 MG-MG-UNIT TB24 Take  1 tablet by mouth daily.  . chlorpheniramine (CHLOR-TRIMETON) 4 MG tablet Take 4 mg by mouth daily.  . cholecalciferol (VITAMIN D) 1000 UNITS tablet Take 1 tablet by mouth daily.  . cloNIDine (CATAPRES) 0.1 MG tablet Take 1 tablet by mouth at bedtime.  . ferrous sulfate 325 (65 FE) MG tablet Take 325 mg by mouth daily with breakfast.  . furosemide (LASIX) 20 MG  tablet TAKE 1 TABLET EVERY DAY  . gabapentin (NEURONTIN) 100 MG capsule TAKE 1 CAPSULE THREE TIMES DAILY (Patient taking differently: take 3 capsules three times a day)  . HYDROcodone-acetaminophen (NORCO/VICODIN) 5-325 MG tablet Take 1 tablet by mouth 3 (three) times daily.  . magnesium oxide (MAG-OX) 400 MG tablet Take 400 mg by mouth 2 (two) times daily.  . MULTIPLE VITAMINS-MINERALS PO Take 1 tablet by mouth daily.  . propranolol ER (INDERAL LA) 60 MG 24 hr capsule TAKE 1 CAPSULE EVERY DAY  . rOPINIRole (REQUIP) 0.5 MG tablet TAKE 7 TABLETS AT BEDTIME    PHQ 2/9 Scores 04/14/2018 04/13/2017 03/17/2017 04/16/2016  PHQ - 2 Score 0 0 0 0  PHQ- 9 Score 0 - 3 -    Physical Exam  Constitutional: She is oriented to person, place, and time. She appears well-developed and well-nourished. No distress.  HENT:  Head: Normocephalic and atraumatic.  Right Ear: External ear and ear canal normal. Tympanic membrane is not erythematous and not retracted.  Left Ear: External ear and ear canal normal. Tympanic membrane is not erythematous and not retracted.  Nose: Right sinus exhibits maxillary sinus tenderness and frontal sinus tenderness. Left sinus exhibits maxillary sinus tenderness and frontal sinus tenderness.  Mouth/Throat: Uvula is midline and mucous membranes are normal. No oral lesions. Posterior oropharyngeal erythema present. No oropharyngeal exudate.  Cardiovascular: Normal rate, regular rhythm and normal heart sounds.  Pulmonary/Chest: Effort normal and breath sounds normal. No respiratory distress. She has no wheezes. She has no rales.  Musculoskeletal: Normal range of motion.  Lymphadenopathy:    She has no cervical adenopathy.  Neurological: She is alert and oriented to person, place, and time.  Skin: Skin is warm and dry. No rash noted.  Psychiatric: She has a normal mood and affect. Her behavior is normal. Thought content normal.  Nursing note and vitals reviewed.   Pulse (!) 51    Temp 97.6 F (36.4 C) (Oral)   Ht 5\' 2"  (1.575 m)   Wt 166 lb (75.3 kg)   SpO2 99%   BMI 30.36 kg/m   Assessment and Plan: 1. Acute non-recurrent maxillary sinusitis Continue decongestants, saline spray - azithromycin (ZITHROMAX Z-PAK) 250 MG tablet; UAD  Dispense: 6 each; Refill: 0   Partially dictated using Editor, commissioning. Any errors are unintentional.  Halina Maidens, MD [facility]  06/01/2018

## 2018-06-14 ENCOUNTER — Ambulatory Visit (INDEPENDENT_AMBULATORY_CARE_PROVIDER_SITE_OTHER): Payer: Medicare PPO | Admitting: Internal Medicine

## 2018-06-14 ENCOUNTER — Encounter: Payer: Self-pay | Admitting: Internal Medicine

## 2018-06-14 VITALS — HR 60 | Temp 97.3°F | Ht 62.0 in | Wt 164.0 lb

## 2018-06-14 DIAGNOSIS — J0111 Acute recurrent frontal sinusitis: Secondary | ICD-10-CM | POA: Diagnosis not present

## 2018-06-14 MED ORDER — AZITHROMYCIN 250 MG PO TABS: ORAL_TABLET | ORAL | 0 refills | Status: DC

## 2018-06-14 NOTE — Progress Notes (Signed)
Date:  06/14/2018   Name:  Sandra Mckinney   DOB:  1945-07-24   MRN:  [id number]   Chief Complaint: Sinusitis (Was seen 2 weeks ago. Never went away. Getting worse again. Green mucous. Sinus drainage. Sore throat. Some coughing. Aching. )  Sinusitis  This is a recurrent problem. The current episode started in the past 7 days. The problem is unchanged. There has been no fever. The pain is mild. Associated symptoms include congestion, coughing, headaches, sinus pressure and a sore throat. Pertinent negatives include no chills, ear pain or hoarse voice.  She just finished a zpak and felt improved but not completely.  Over the past few days, sx have increased again.  Review of Systems  Constitutional: Negative for chills.  HENT: Positive for congestion, sinus pressure and sore throat. Negative for ear pain, hoarse voice and trouble swallowing.   Respiratory: Positive for cough. Negative for chest tightness and wheezing.   Cardiovascular: Negative for chest pain and palpitations.  Gastrointestinal: Negative for abdominal pain, diarrhea and nausea.  Neurological: Positive for headaches. Negative for dizziness and light-headedness.    Patient Active Problem List   Diagnosis Date Noted  . Fibromyalgia 07/29/2016  . Hyperlipidemia 05/20/2016  . Age-related osteoporosis without current pathological fracture 04/16/2016  . Anemia 12/12/2015  . Neoplasm of left breast, primary tumor staging category Tis: ductal carcinoma in situ (DCIS) 01/03/2015  . Neoplasm of right breast, primary tumor staging category Tis: ductal carcinoma in situ (DCIS) 12/27/2014  . Anxiety disorder 10/11/2014  . Plantar fasciitis 10/11/2014  . Local edema 10/11/2014  . Depression, major, single episode, in partial remission ([place]) 10/11/2014  . Hot flash, menopausal 10/11/2014  . Migraine without aura and responsive to treatment 10/11/2014  . Idiopathic insomnia 10/11/2014  . Psoriasis 10/11/2014  .  Restless leg 10/11/2014  . Avitaminosis D 10/11/2014    Allergies  Allergen Reactions  . Cephalexin Hives  . Letrozole Swelling  . Raloxifene Hives  . Selective Estrogen Receptor Modulators Hives  . Cephalosporins   . Duloxetine Hcl   . Lyrica [Pregabalin]   . Oxycodone   . Penicillins   . Prilosec  [Omeprazole]   . Sulfa Antibiotics     Past Surgical History:  Procedure Laterality Date  . ABDOMINAL HYSTERECTOMY  1986   uterine fibroids and endometriosis  . BREAST BIOPSY Right 06/01/12   [place] bx/clip-neg  . BREAST RECONSTRUCTION Bilateral 2017  . CARPAL TUNNEL RELEASE Right 2014  . EYE SURGERY Bilateral 2009   catarcts  . MASTECTOMY, RADICAL Bilateral 2016  . REDUCTION MAMMAPLASTY     2001  . THUMB ARTHROSCOPY Right 2014    Social History   Tobacco Use  . Smoking status: Former Smoker    Packs/day: 0.75    Years: 10.00    Pack years: 7.50    Types: Cigarettes    Last attempt to quit: 04/13/1974    Years since quitting: 44.2  . Smokeless tobacco: Never Used  . Tobacco comment: smoking cessation materials not required  Substance Use Topics  . Alcohol use: No    Alcohol/week: 0.0 standard drinks  . Drug use: No     Medication list has been reviewed and updated.  Current Meds  Medication Sig  . aspirin-acetaminophen-caffeine (EXCEDRIN MIGRAINE) 638-453-64 MG tablet Take by mouth every 6 (six) hours as needed for headache.  . betamethasone dipropionate (DIPROLENE) 0.05 % cream Apply 1 application topically 2 (two) times daily.  . busPIRone (BUSPAR) 10 MG tablet TAKE  1 TABLET FOUR TIMES DAILY  . Calcium-Magnesium-Vitamin D 600-40-500 MG-MG-UNIT TB24 Take 1 tablet by mouth daily.  . chlorpheniramine (CHLOR-TRIMETON) 4 MG tablet Take 4 mg by mouth daily.  . cholecalciferol (VITAMIN D) 1000 UNITS tablet Take 1 tablet by mouth daily.  . cloNIDine (CATAPRES) 0.1 MG tablet Take 1 tablet by mouth at bedtime.  . ferrous sulfate 325 (65 FE) MG tablet Take 325 mg by  mouth daily with breakfast.  . furosemide (LASIX) 20 MG tablet TAKE 1 TABLET EVERY DAY  . gabapentin (NEURONTIN) 100 MG capsule TAKE 1 CAPSULE THREE TIMES DAILY (Patient taking differently: take 3 capsules three times a day)  . HYDROcodone-acetaminophen (NORCO/VICODIN) 5-325 MG tablet Take 1 tablet by mouth 3 (three) times daily.  . magnesium oxide (MAG-OX) 400 MG tablet Take 400 mg by mouth 2 (two) times daily.  . MULTIPLE VITAMINS-MINERALS PO Take 1 tablet by mouth daily.  . propranolol ER (INDERAL LA) 60 MG 24 hr capsule TAKE 1 CAPSULE EVERY DAY  . rOPINIRole (REQUIP) 0.5 MG tablet TAKE 7 TABLETS AT BEDTIME    PHQ 2/9 Scores 04/14/2018 04/13/2017 03/17/2017 04/16/2016  PHQ - 2 Score 0 0 0 0  PHQ- 9 Score 0 - 3 -    Physical Exam  Constitutional: She is oriented to person, place, and time. She appears well-developed and well-nourished. No distress.  HENT:  Head: Normocephalic and atraumatic.  Right Ear: External ear and ear canal normal. Tympanic membrane is not erythematous and not retracted.  Left Ear: External ear and ear canal normal. Tympanic membrane is not erythematous and not retracted.  Nose: Right sinus exhibits frontal sinus tenderness. Right sinus exhibits no maxillary sinus tenderness. Left sinus exhibits frontal sinus tenderness. Left sinus exhibits no maxillary sinus tenderness.  Mouth/Throat: Uvula is midline and mucous membranes are normal. No oral lesions. Posterior oropharyngeal erythema present. No oropharyngeal exudate.  Neck: Normal range of motion.  Cardiovascular: Normal rate, regular rhythm and normal heart sounds.  Pulmonary/Chest: Effort normal and breath sounds normal. No respiratory distress. She has no wheezes. She has no rales.  Musculoskeletal: Normal range of motion.  Lymphadenopathy:    She has no cervical adenopathy.  Neurological: She is alert and oriented to person, place, and time.  Skin: Skin is warm and dry. No rash noted.  Psychiatric: She has a  normal mood and affect. Her behavior is normal. Thought content normal.  Nursing note and vitals reviewed.   Pulse 60   Temp (!) 97.3 F (36.3 C) (Oral)   Ht 5\' 2"  (1.575 m)   Wt 164 lb (74.4 kg)   SpO2 95%   BMI 30.00 kg/m   Assessment and Plan: 1. Acute recurrent frontal sinusitis Continue cough drops Tylenol as needed - azithromycin (ZITHROMAX Z-PAK) 250 MG tablet; UAD  Dispense: 6 each; Refill: 0   Partially dictated using Editor, commissioning. Any errors are unintentional.  Halina Maidens, MD [facility]  06/14/2018

## 2018-06-14 NOTE — Patient Instructions (Signed)
Robitussin DM or Delsym cough syrup is good

## 2018-06-17 ENCOUNTER — Other Ambulatory Visit: Payer: Self-pay | Admitting: Internal Medicine

## 2018-06-18 ENCOUNTER — Telehealth: Payer: Self-pay

## 2018-06-18 ENCOUNTER — Other Ambulatory Visit: Payer: Self-pay | Admitting: Internal Medicine

## 2018-06-18 DIAGNOSIS — M797 Fibromyalgia: Secondary | ICD-10-CM

## 2018-06-18 MED ORDER — HYDROCODONE-ACETAMINOPHEN 5-325 MG PO TABS: 1.0000 | ORAL_TABLET | Freq: Three times a day (TID) | ORAL | 0 refills | Status: DC

## 2018-06-18 NOTE — Telephone Encounter (Signed)
Patient called asking we send in Hydrocodone

## 2018-06-23 ENCOUNTER — Ambulatory Visit: Payer: Medicare PPO | Admitting: Internal Medicine

## 2018-06-23 ENCOUNTER — Telehealth: Payer: Self-pay

## 2018-06-23 ENCOUNTER — Other Ambulatory Visit: Payer: Self-pay | Admitting: Internal Medicine

## 2018-06-23 DIAGNOSIS — J0111 Acute recurrent frontal sinusitis: Secondary | ICD-10-CM

## 2018-06-23 MED ORDER — LEVOFLOXACIN 500 MG PO TABS: 500.0000 mg | ORAL_TABLET | Freq: Every day | ORAL | 0 refills | Status: DC

## 2018-06-23 NOTE — Telephone Encounter (Signed)
Noticed patient was on the schedule today for f/up from sinus infection. Gave patient a call to find out if she completed her abx from recent visit. She stated she did but she is worse. This is her second zpack in the last month for this infection.  Spoke with Dr Army Melia- told her [facility] [place]. She stated this was okay.  Please Advise.

## 2018-07-16 DIAGNOSIS — Z170 Estrogen receptor positive status [ER+]: Secondary | ICD-10-CM | POA: Diagnosis not present

## 2018-07-16 DIAGNOSIS — Z780 Asymptomatic menopausal state: Secondary | ICD-10-CM | POA: Diagnosis not present

## 2018-07-16 DIAGNOSIS — C50412 Malignant neoplasm of upper-outer quadrant of left female breast: Secondary | ICD-10-CM | POA: Diagnosis not present

## 2018-07-16 DIAGNOSIS — H259 Unspecified age-related cataract: Secondary | ICD-10-CM | POA: Diagnosis not present

## 2018-07-16 DIAGNOSIS — M85852 Other specified disorders of bone density and structure, left thigh: Secondary | ICD-10-CM | POA: Diagnosis not present

## 2018-07-17 LAB — HM DEXA SCAN

## 2018-07-19 DIAGNOSIS — C50412 Malignant neoplasm of upper-outer quadrant of left female breast: Secondary | ICD-10-CM | POA: Diagnosis not present

## 2018-07-19 DIAGNOSIS — Z170 Estrogen receptor positive status [ER+]: Secondary | ICD-10-CM | POA: Diagnosis not present

## 2018-07-20 ENCOUNTER — Other Ambulatory Visit: Payer: Self-pay | Admitting: Internal Medicine

## 2018-07-20 DIAGNOSIS — M797 Fibromyalgia: Secondary | ICD-10-CM

## 2018-07-20 MED ORDER — HYDROCODONE-ACETAMINOPHEN 5-325 MG PO TABS: 1.0000 | ORAL_TABLET | Freq: Three times a day (TID) | ORAL | 0 refills | Status: DC

## 2018-07-21 ENCOUNTER — Other Ambulatory Visit: Payer: Self-pay

## 2018-07-21 MED ORDER — BUSPIRONE HCL 10 MG PO TABS: ORAL_TABLET | ORAL | 1 refills | Status: DC

## 2018-07-23 ENCOUNTER — Other Ambulatory Visit: Payer: Self-pay | Admitting: Internal Medicine

## 2018-07-23 MED ORDER — BUSPIRONE HCL 10 MG PO TABS: ORAL_TABLET | ORAL | 3 refills | Status: DC

## 2018-08-04 ENCOUNTER — Other Ambulatory Visit: Payer: Self-pay | Admitting: Internal Medicine

## 2018-08-09 ENCOUNTER — Other Ambulatory Visit: Payer: Self-pay | Admitting: Internal Medicine

## 2018-08-10 ENCOUNTER — Other Ambulatory Visit: Payer: Self-pay

## 2018-08-10 DIAGNOSIS — Z20828 Contact with and (suspected) exposure to other viral communicable diseases: Secondary | ICD-10-CM

## 2018-08-10 MED ORDER — OSELTAMIVIR PHOSPHATE 75 MG PO CAPS: 75.0000 mg | ORAL_CAPSULE | Freq: Two times a day (BID) | ORAL | Status: DC

## 2018-08-10 MED ORDER — OSELTAMIVIR PHOSPHATE 75 MG PO CAPS: 75.0000 mg | ORAL_CAPSULE | Freq: Two times a day (BID) | ORAL | 0 refills | Status: DC

## 2018-08-18 ENCOUNTER — Other Ambulatory Visit: Payer: Self-pay | Admitting: Internal Medicine

## 2018-08-18 DIAGNOSIS — M797 Fibromyalgia: Secondary | ICD-10-CM

## 2018-08-18 MED ORDER — HYDROCODONE-ACETAMINOPHEN 5-325 MG PO TABS: 1.0000 | ORAL_TABLET | Freq: Three times a day (TID) | ORAL | 0 refills | Status: DC

## 2018-08-19 ENCOUNTER — Other Ambulatory Visit: Payer: Self-pay | Admitting: Internal Medicine

## 2018-08-19 ENCOUNTER — Telehealth: Payer: Self-pay | Admitting: Internal Medicine

## 2018-08-19 DIAGNOSIS — M797 Fibromyalgia: Secondary | ICD-10-CM

## 2018-08-19 MED ORDER — HYDROCODONE-ACETAMINOPHEN 5-325 MG PO TABS: 1.0000 | ORAL_TABLET | Freq: Three times a day (TID) | ORAL | 0 refills | Status: DC

## 2018-08-19 NOTE — Telephone Encounter (Addendum)
Husband called inquiring about med refill, I advised refill sent in through [facility] mail service, [facility] husband.   HYDROcodone-acetaminophen (NORCO/VICODIN) 5-325 MG tablet [[id number]]

## 2018-08-19 NOTE — Telephone Encounter (Signed)
[facility].  It should not have gone to [facility] since I have never sent it there before.  But then, that's computers for you.

## 2018-08-23 DIAGNOSIS — Z170 Estrogen receptor positive status [ER+]: Secondary | ICD-10-CM | POA: Diagnosis not present

## 2018-08-23 DIAGNOSIS — C50412 Malignant neoplasm of upper-outer quadrant of left female breast: Secondary | ICD-10-CM | POA: Diagnosis not present

## 2018-08-30 DIAGNOSIS — Z86018 Personal history of other benign neoplasm: Secondary | ICD-10-CM | POA: Diagnosis not present

## 2018-08-30 DIAGNOSIS — L817 Pigmented purpuric dermatosis: Secondary | ICD-10-CM | POA: Diagnosis not present

## 2018-08-30 DIAGNOSIS — Z872 Personal history of diseases of the skin and subcutaneous tissue: Secondary | ICD-10-CM | POA: Diagnosis not present

## 2018-08-30 DIAGNOSIS — Z1283 Encounter for screening for malignant neoplasm of skin: Secondary | ICD-10-CM | POA: Diagnosis not present

## 2018-08-30 DIAGNOSIS — L578 Other skin changes due to chronic exposure to nonionizing radiation: Secondary | ICD-10-CM | POA: Diagnosis not present

## 2018-09-17 ENCOUNTER — Other Ambulatory Visit: Payer: Self-pay | Admitting: Internal Medicine

## 2018-09-17 DIAGNOSIS — M797 Fibromyalgia: Secondary | ICD-10-CM

## 2018-09-17 MED ORDER — HYDROCODONE-ACETAMINOPHEN 5-325 MG PO TABS: 1.0000 | ORAL_TABLET | Freq: Three times a day (TID) | ORAL | 0 refills | Status: DC

## 2018-09-22 ENCOUNTER — Telehealth: Payer: Self-pay

## 2018-09-22 NOTE — Telephone Encounter (Signed)
Patient called stating there was an error made with her hydrocodone medication. She said two days [facility] and [facility] her 30 day supply (90 tabs Take TID) and then today she received a UPS order from [facility] for another 30 day supply.  Instructed patient to take the medication as instructed. Since has a tow month supply, told her to take both months and then call and the end of the second month and we will [facility] a [facility] and NOT [facility].  Patient verbalized understanding.

## 2018-11-15 ENCOUNTER — Other Ambulatory Visit: Payer: Self-pay | Admitting: Internal Medicine

## 2018-11-15 ENCOUNTER — Telehealth: Payer: Self-pay

## 2018-11-15 DIAGNOSIS — M797 Fibromyalgia: Secondary | ICD-10-CM

## 2018-11-15 MED ORDER — HYDROCODONE-ACETAMINOPHEN 5-325 MG PO TABS: 1.0000 | ORAL_TABLET | Freq: Three times a day (TID) | ORAL | 0 refills | Status: DC

## 2018-11-15 NOTE — Telephone Encounter (Signed)
Patient informed. 

## 2018-11-15 NOTE — Telephone Encounter (Signed)
Patient called requesting refill for her hydrocodone. [facility] [place].  Please Advise.

## 2018-11-15 NOTE — Telephone Encounter (Signed)
This has been done.

## 2018-12-13 ENCOUNTER — Telehealth: Payer: Self-pay | Admitting: Internal Medicine

## 2018-12-13 ENCOUNTER — Other Ambulatory Visit: Payer: Self-pay

## 2018-12-13 ENCOUNTER — Ambulatory Visit (INDEPENDENT_AMBULATORY_CARE_PROVIDER_SITE_OTHER): Payer: Medicare PPO | Admitting: Internal Medicine

## 2018-12-13 ENCOUNTER — Encounter: Payer: Self-pay | Admitting: Internal Medicine

## 2018-12-13 VITALS — BP 110/61 | HR 55 | Temp 96.9°F | Ht 62.0 in | Wt 158.1 lb

## 2018-12-13 DIAGNOSIS — M797 Fibromyalgia: Secondary | ICD-10-CM

## 2018-12-13 DIAGNOSIS — J0100 Acute maxillary sinusitis, unspecified: Secondary | ICD-10-CM | POA: Diagnosis not present

## 2018-12-13 MED ORDER — PREDNISONE 10 MG PO TABS: 10.0000 mg | ORAL_TABLET | ORAL | 0 refills | Status: AC

## 2018-12-13 MED ORDER — DOXYCYCLINE HYCLATE 100 MG PO TABS: 100.0000 mg | ORAL_TABLET | Freq: Two times a day (BID) | ORAL | 0 refills | Status: DC

## 2018-12-13 MED ORDER — HYDROCODONE-ACETAMINOPHEN 5-325 MG PO TABS: 1.0000 | ORAL_TABLET | Freq: Three times a day (TID) | ORAL | 0 refills | Status: DC

## 2018-12-13 NOTE — Telephone Encounter (Signed)
Patient is having sinus sore throat,pain and swelling of left side of side of face and gums and teeth hurt can not chew which makes it painful. Prefer over the phone visit.

## 2018-12-13 NOTE — Telephone Encounter (Signed)
Please schedule 3pm Telephone and verify the number we need to call her at. Let her know we will call her between [phone]

## 2018-12-13 NOTE — Progress Notes (Signed)
Date:  12/13/2018   Name:  Sandra Mckinney   DOB:  23-Aug-1945   MRN:  [id number]  I connected with this patient, Sandra Mckinney, by telephone at the patient's home.  I verified that I am speaking with the correct person using two identifiers. This visit was conducted via telephone due to the Covid-19 outbreak from my office at [facility] in [place], [place]. I discussed the limitations, risks, security and privacy concerns of performing an evaluation and management service by telephone. I also discussed with the patient that there may be a patient responsible charge related to this service. The patient expressed understanding and agreed to proceed.  Chief Complaint: Sinusitis (Sinus, sore throat, teeth pain and feels like left side of face is swollen. Husband looked in throat with flash light and seen red lines and red and swollen. Started Friday night. Sinus drainage causing throat to be sore. Ibuprofen, and tylenol tried. Everything is on the Left side. Teeth pain on left side.  )  Sinusitis  This is a new problem. The current episode started in the past 7 days. The problem has been gradually worsening since onset. There has been no fever. The pain is mild. Associated symptoms include headaches, sinus pressure and a sore throat. Pertinent negatives include no chills, coughing, ear pain or shortness of breath. Past treatments include oral decongestants and spray decongestants. The treatment provided no relief.    Review of Systems  Constitutional: Negative for chills, fatigue and fever.  HENT: Positive for dental problem, postnasal drip, sinus pressure, sore throat and trouble swallowing. Negative for ear discharge and ear pain.   Eyes: Positive for visual disturbance.  Respiratory: Negative for cough, chest tightness, shortness of breath and wheezing.   Cardiovascular: Negative for chest pain and palpitations.  Allergic/Immunologic: Positive for environmental allergies.   Neurological: Positive for headaches. Negative for dizziness.  Hematological: Negative for adenopathy.    Patient Active Problem List   Diagnosis Date Noted  . Fibromyalgia 07/29/2016  . Hyperlipidemia 05/20/2016  . Age-related osteoporosis without current pathological fracture 04/16/2016  . Anemia 12/12/2015  . Neoplasm of left breast, primary tumor staging category Tis: ductal carcinoma in situ (DCIS) 01/03/2015  . Neoplasm of right breast, primary tumor staging category Tis: ductal carcinoma in situ (DCIS) 12/27/2014  . Anxiety disorder 10/11/2014  . Plantar fasciitis 10/11/2014  . Local edema 10/11/2014  . Depression, major, single episode, in partial remission ([place]) 10/11/2014  . Hot flash, menopausal 10/11/2014  . Migraine without aura and responsive to treatment 10/11/2014  . Idiopathic insomnia 10/11/2014  . Psoriasis 10/11/2014  . Restless leg 10/11/2014  . Avitaminosis D 10/11/2014    Allergies  Allergen Reactions  . Cephalexin Hives  . Letrozole Swelling  . Raloxifene Hives  . Selective Estrogen Receptor Modulators Hives  . Cephalosporins   . Duloxetine Hcl   . Lyrica [Pregabalin]   . Oxycodone   . Penicillins   . Prilosec  [Omeprazole]   . Sulfa Antibiotics     Past Surgical History:  Procedure Laterality Date  . ABDOMINAL HYSTERECTOMY  1986   uterine fibroids and endometriosis  . BREAST BIOPSY Right 06/01/12   [place] bx/clip-neg  . BREAST RECONSTRUCTION Bilateral 2017  . CARPAL TUNNEL RELEASE Right 2014  . EYE SURGERY Bilateral 2009   catarcts  . MASTECTOMY, RADICAL Bilateral 2016  . REDUCTION MAMMAPLASTY     2001  . THUMB ARTHROSCOPY Right 2014    Social History   Tobacco Use  .  Smoking status: Former Smoker    Packs/day: 0.75    Years: 10.00    Pack years: 7.50    Types: Cigarettes    Last attempt to quit: 04/13/1974    Years since quitting: 44.6  . Smokeless tobacco: Never Used  . Tobacco comment: smoking cessation materials not required   Substance Use Topics  . Alcohol use: No    Alcohol/week: 0.0 standard drinks  . Drug use: No     Medication list has been reviewed and updated.  Current Meds  Medication Sig  . aspirin-acetaminophen-caffeine (EXCEDRIN MIGRAINE) 063-016-01 MG tablet Take by mouth every 6 (six) hours as needed for headache.  . betamethasone dipropionate (DIPROLENE) 0.05 % cream Apply 1 application topically 2 (two) times daily.  . busPIRone (BUSPAR) 10 MG tablet TAKE 1 TABLET FOUR TIMES DAILY  . Calcium-Magnesium-Vitamin D 600-40-500 MG-MG-UNIT TB24 Take 1 tablet by mouth daily.  . chlorpheniramine (CHLOR-TRIMETON) 4 MG tablet Take 4 mg by mouth daily.  . cholecalciferol (VITAMIN D) 1000 UNITS tablet Take 1 tablet by mouth daily.  . cloNIDine (CATAPRES) 0.1 MG tablet Take 1 tablet by mouth 2 (two) times daily.   . ferrous sulfate 325 (65 FE) MG tablet Take 325 mg by mouth daily with breakfast.  . furosemide (LASIX) 20 MG tablet TAKE 1 TABLET EVERY DAY  . gabapentin (NEURONTIN) 100 MG capsule TAKE 1 CAPSULE THREE TIMES DAILY (Patient taking differently: take 3 capsules three times a day)  . HYDROcodone-acetaminophen (NORCO/VICODIN) 5-325 MG tablet Take 1 tablet by mouth 3 (three) times daily for 30 days.  . magnesium oxide (MAG-OX) 400 MG tablet Take 400 mg by mouth 2 (two) times daily.  . MULTIPLE VITAMINS-MINERALS PO Take 1 tablet by mouth daily.  . propranolol ER (INDERAL LA) 60 MG 24 hr capsule TAKE 1 CAPSULE EVERY DAY  . rOPINIRole (REQUIP) 0.5 MG tablet TAKE 7 TABLETS AT BEDTIME    PHQ 2/9 Scores 12/13/2018 04/14/2018 04/13/2017 03/17/2017  PHQ - 2 Score 0 0 0 0  PHQ- 9 Score - 0 - 3    BP Readings from Last 3 Encounters:  12/13/18 110/61  04/14/18 122/70  04/16/16 122/84    Physical Exam Pulmonary:     Effort: Pulmonary effort is normal.     Comments: No cough noted, voice normal Neurological:     Mental Status: She is alert.  Psychiatric:        Attention and Perception: Attention  normal.        Mood and Affect: Mood normal.        Cognition and Memory: Cognition normal.     Wt Readings from Last 3 Encounters:  12/13/18 158 lb 1.6 oz (71.7 kg)  06/14/18 164 lb (74.4 kg)  06/01/18 166 lb (75.3 kg)    BP 110/61 (BP Location: Left Leg, Patient Position: Sitting, Cuff Size: Normal)   Pulse (!) 55   Temp (!) 96.9 F (36.1 C) (Oral)   Ht 5\' 2"  (1.575 m)   Wt 158 lb 1.6 oz (71.7 kg)   SpO2 96%   BMI 28.92 kg/m   Assessment and Plan: 1. Acute non-recurrent maxillary sinusitis Continue decongestants and saline spray - doxycycline (VIBRA-TABS) 100 MG tablet; Take 1 tablet (100 mg total) by mouth 2 (two) times daily for 10 days.  Dispense: 20 tablet; Refill: 0 - predniSONE (DELTASONE) 10 MG tablet; Take 1 tablet (10 mg total) by mouth as directed for 6 days. Take 6,5,4,3,2,1 then stop  Dispense: 21 tablet; Refill: 0  2. Fibromyalgia - HYDROcodone-acetaminophen (NORCO/VICODIN) 5-325 MG tablet; Take 1 tablet by mouth 3 (three) times daily for 30 days.  Dispense: 90 tablet; Refill: 0  I spent 11 minutes on this call.  Partially dictated using Editor, commissioning. Any errors are unintentional.  Halina Maidens, MD [facility]  12/13/2018

## 2018-12-21 ENCOUNTER — Other Ambulatory Visit: Payer: Self-pay

## 2018-12-21 ENCOUNTER — Telehealth: Payer: Self-pay | Admitting: Internal Medicine

## 2018-12-21 ENCOUNTER — Telehealth: Payer: Self-pay

## 2018-12-21 ENCOUNTER — Other Ambulatory Visit: Payer: Self-pay | Admitting: Internal Medicine

## 2018-12-21 DIAGNOSIS — J0100 Acute maxillary sinusitis, unspecified: Secondary | ICD-10-CM

## 2018-12-21 MED ORDER — LEVOFLOXACIN 500 MG PO TABS: 500.0000 mg | ORAL_TABLET | Freq: Every day | ORAL | 0 refills | Status: DC

## 2018-12-21 MED ORDER — LEVOFLOXACIN 500 MG PO TABS: 500.0000 mg | ORAL_TABLET | Freq: Every day | ORAL | 0 refills | Status: AC

## 2018-12-21 NOTE — Telephone Encounter (Signed)
I will send in an Rx for Levaquin.

## 2018-12-21 NOTE — Telephone Encounter (Signed)
Patient husband informed.

## 2018-12-21 NOTE — Telephone Encounter (Signed)
Patient husband called, Sandra Mckinney, stating patient had telephone visit 06/08 and she has now finished the prednisone and will be done with the doxycycline this Thursday. The prednisone helped with pain and swelling in her face and teeth but they feel the abx are not helping with sinus infection.  Patient husband thinks she needs another telephone visit. OR could we just send in a different antibiotic?  Please Advise.

## 2018-12-31 DIAGNOSIS — Z170 Estrogen receptor positive status [ER+]: Secondary | ICD-10-CM | POA: Diagnosis not present

## 2018-12-31 DIAGNOSIS — C50412 Malignant neoplasm of upper-outer quadrant of left female breast: Secondary | ICD-10-CM | POA: Diagnosis not present

## 2019-01-04 DIAGNOSIS — Z170 Estrogen receptor positive status [ER+]: Secondary | ICD-10-CM | POA: Diagnosis not present

## 2019-01-04 DIAGNOSIS — C50412 Malignant neoplasm of upper-outer quadrant of left female breast: Secondary | ICD-10-CM | POA: Diagnosis not present

## 2019-01-14 ENCOUNTER — Telehealth: Payer: Self-pay

## 2019-01-14 ENCOUNTER — Other Ambulatory Visit: Payer: Self-pay | Admitting: Internal Medicine

## 2019-01-14 DIAGNOSIS — M797 Fibromyalgia: Secondary | ICD-10-CM

## 2019-01-14 MED ORDER — HYDROCODONE-ACETAMINOPHEN 5-325 MG PO TABS: 1.0000 | ORAL_TABLET | Freq: Three times a day (TID) | ORAL | 0 refills | Status: DC

## 2019-01-14 NOTE — Telephone Encounter (Signed)
Patient called requesting RF for hydrocodone. Asked that I remind you to send this to [facility] and NOT her mail order.  Please Advise.

## 2019-02-11 ENCOUNTER — Telehealth: Payer: Self-pay

## 2019-02-11 NOTE — Telephone Encounter (Signed)
Patient called to get a refill on hydrocodone Rx. [facility] [place]. I cannot call this Rx in, it has to be sent electronically.     Thankyou.

## 2019-02-13 ENCOUNTER — Other Ambulatory Visit: Payer: Self-pay | Admitting: Internal Medicine

## 2019-02-13 DIAGNOSIS — M797 Fibromyalgia: Secondary | ICD-10-CM

## 2019-02-13 MED ORDER — HYDROCODONE-ACETAMINOPHEN 5-325 MG PO TABS: 1.0000 | ORAL_TABLET | Freq: Three times a day (TID) | ORAL | 0 refills | Status: DC

## 2019-02-21 ENCOUNTER — Other Ambulatory Visit: Payer: Self-pay | Admitting: Internal Medicine

## 2019-03-15 ENCOUNTER — Other Ambulatory Visit: Payer: Self-pay | Admitting: Internal Medicine

## 2019-03-15 ENCOUNTER — Telehealth: Payer: Self-pay

## 2019-03-15 DIAGNOSIS — M797 Fibromyalgia: Secondary | ICD-10-CM

## 2019-03-15 MED ORDER — HYDROCODONE-ACETAMINOPHEN 5-325 MG PO TABS: 1.0000 | ORAL_TABLET | Freq: Three times a day (TID) | ORAL | 0 refills | Status: DC

## 2019-03-15 NOTE — Telephone Encounter (Signed)
Pt called requesting a refill on hydrocodone. [facility] [place].  Please advise,

## 2019-04-13 ENCOUNTER — Other Ambulatory Visit: Payer: Self-pay | Admitting: Internal Medicine

## 2019-04-13 DIAGNOSIS — G2581 Restless legs syndrome: Secondary | ICD-10-CM

## 2019-04-14 ENCOUNTER — Telehealth: Payer: Self-pay

## 2019-04-14 ENCOUNTER — Other Ambulatory Visit: Payer: Self-pay | Admitting: Internal Medicine

## 2019-04-14 DIAGNOSIS — M797 Fibromyalgia: Secondary | ICD-10-CM

## 2019-04-14 MED ORDER — HYDROCODONE-ACETAMINOPHEN 5-325 MG PO TABS: 1.0000 | ORAL_TABLET | Freq: Three times a day (TID) | ORAL | 0 refills | Status: DC

## 2019-04-14 NOTE — Telephone Encounter (Signed)
Pt called to request RF on hydrocodone medication. [facility] [place].

## 2019-04-18 ENCOUNTER — Ambulatory Visit (INDEPENDENT_AMBULATORY_CARE_PROVIDER_SITE_OTHER): Payer: Medicare PPO

## 2019-04-18 VITALS — Ht 62.0 in | Wt 151.3 lb

## 2019-04-18 DIAGNOSIS — Z0000 Encounter for general adult medical examination without abnormal findings: Secondary | ICD-10-CM

## 2019-04-18 NOTE — Progress Notes (Signed)
Subjective:   Sandra Mckinney is a 73 y.o. female who presents for Medicare Annual (Subsequent) preventive examination.  Virtual Visit via Telephone Note  I connected with Sandra Mckinney on 04/18/19 at  9:20 AM EDT by telephone and verified that I am speaking with the correct person using two identifiers.  Medicare Annual Wellness visit completed telephonically due to Covid-19 pandemic.   Location: Patient: home Provider: office   I discussed the limitations, risks, security and privacy concerns of performing an evaluation and management service by telephone and the availability of in person appointments. The patient expressed understanding and agreed to proceed.  Some vital signs may be absent or patient reported.   [name], LPN    Review of Systems:   Cardiac Risk Factors include: advanced age (>76men, >65 women)     Objective:     Vitals: Ht 5\' 2"  (1.575 m)   Wt 151 lb 4.8 oz (68.6 kg)   SpO2 97%   BMI 27.67 kg/m   Body mass index is 27.67 kg/m.  Advanced Directives 04/18/2019 04/14/2018 04/13/2017 04/16/2016 10/10/2015  Does Patient Have a Medical Advance Directive? Yes Yes Yes No;Yes Yes  Type of Advance Directive Living will;Healthcare Power of [place];Living will [place];Living will - Living will  Copy of [place] in Chart? No - copy requested No - copy requested No - copy requested - -    Tobacco Social History   Tobacco Use  Smoking Status Former Smoker  . Packs/day: 0.75  . Years: 10.00  . Pack years: 7.50  . Types: Cigarettes  . Quit date: 04/13/1974  . Years since quitting: 45.0  Smokeless Tobacco Never Used  Tobacco Comment   smoking cessation materials not required     Counseling given: Not Answered Comment: smoking cessation materials not required   Clinical Intake:  Pre-visit preparation completed: Yes  Pain : No/denies pain     BMI -  recorded: 27.67 Nutritional Status: BMI 25 -29 Overweight Nutritional Risks: None Diabetes: No  How often do you need to have someone help you when you read instructions, pamphlets, or other written materials from your doctor or pharmacy?: 1 - Never  Interpreter Needed?: No  Information entered by :: [name] LPN  Past Medical History:  Diagnosis Date  . Breast cancer ([place]) 2016  . Fibromyalgia   . Hyperlipidemia   . Major depressive disorder   . Restless leg syndrome   . Vitamin D deficiency    Past Surgical History:  Procedure Laterality Date  . ABDOMINAL HYSTERECTOMY  1986   uterine fibroids and endometriosis  . BREAST BIOPSY Right 06/01/12   [place] bx/clip-neg  . BREAST RECONSTRUCTION Bilateral 2017  . CARPAL TUNNEL RELEASE Right 2014  . EYE SURGERY Bilateral 2009   catarcts  . MASTECTOMY, RADICAL Bilateral 2016  . REDUCTION MAMMAPLASTY     2001  . THUMB ARTHROSCOPY Right 2014   Family History  Problem Relation Age of Onset  . Dementia Mother   . Hypertension Mother   . Heart disease Father 77   Social History   Socioeconomic History  . Marital status: Married    Spouse name: Not on file  . Number of children: 2  . Years of education: some college  . Highest education level: 12th grade  Occupational History  . Occupation: Retired  Scientific laboratory technician  . Financial resource strain: Not hard at all  . Food insecurity  Worry: Never true    Inability: Never true  . Transportation needs    Medical: No    Non-medical: No  Tobacco Use  . Smoking status: Former Smoker    Packs/day: 0.75    Years: 10.00    Pack years: 7.50    Types: Cigarettes    Quit date: 04/13/1974    Years since quitting: 45.0  . Smokeless tobacco: Never Used  . Tobacco comment: smoking cessation materials not required  Substance and Sexual Activity  . Alcohol use: No    Alcohol/week: 0.0 standard drinks  . Drug use: No  . Sexual activity: Not Currently  Lifestyle  . Physical activity     Days per week: 4 days    Minutes per session: 20 min  . Stress: Only a little  Relationships  . Social Herbalist on phone: Patient refused    Gets together: Patient refused    Attends religious service: Patient refused    Active member of club or organization: Patient refused    Attends meetings of clubs or organizations: Patient refused    Relationship status: Married  Other Topics Concern  . Not on file  Social History Narrative  . Not on file    Outpatient Encounter Medications as of 04/18/2019  Medication Sig  . aspirin-acetaminophen-caffeine (EXCEDRIN MIGRAINE) 250-250-65 MG tablet Take by mouth every 6 (six) hours as needed for headache.  . busPIRone (BUSPAR) 10 MG tablet TAKE 1 TABLET FOUR TIMES DAILY  . Calcium-Magnesium-Vitamin D 600-40-500 MG-MG-UNIT TB24 Take 1 tablet by mouth daily.  . chlorpheniramine (CHLOR-TRIMETON) 4 MG tablet Take 4 mg by mouth daily.  . cholecalciferol (VITAMIN D) 1000 UNITS tablet Take 1 tablet by mouth daily.  . cloNIDine (CATAPRES) 0.1 MG tablet Take 1 tablet by mouth 2 (two) times daily.   . ferrous sulfate 325 (65 FE) MG tablet Take 325 mg by mouth daily with breakfast.  . furosemide (LASIX) 20 MG tablet TAKE 1 TABLET EVERY DAY  . gabapentin (NEURONTIN) 100 MG capsule TAKE 1 CAPSULE THREE TIMES DAILY (Patient taking differently: take 3 capsules three times a day)  . HYDROcodone-acetaminophen (NORCO/VICODIN) 5-325 MG tablet Take 1 tablet by mouth 3 (three) times daily.  . magnesium oxide (MAG-OX) 400 MG tablet Take 400 mg by mouth 2 (two) times daily.  . MULTIPLE VITAMINS-MINERALS PO Take 1 tablet by mouth daily.  . propranolol ER (INDERAL LA) 60 MG 24 hr capsule TAKE 1 CAPSULE EVERY DAY  . rOPINIRole (REQUIP) 0.5 MG tablet TAKE 7 TABLETS AT BEDTIME  . betamethasone dipropionate (DIPROLENE) 0.05 % cream Apply 1 application topically 2 (two) times daily.  . metaxalone (SKELAXIN) 800 MG tablet TAKE 1 TABLET (800 MG TOTAL) BY MOUTH  EVERY 8 (EIGHT) HOURS.   No facility-administered encounter medications on file as of 04/18/2019.     Activities of Daily Living In your present state of health, do you have any difficulty performing the following activities: 04/18/2019  Hearing? N  Comment declines hearing aids  Vision? N  Difficulty concentrating or making decisions? N  Walking or climbing stairs? N  Dressing or bathing? N  Doing errands, shopping? N  Preparing Food and eating ? N  Using the Toilet? N  In the past six months, have you accidently leaked urine? N  Do you have problems with loss of bowel control? N  Managing your Medications? N  Managing your Finances? N  Housekeeping or managing your Housekeeping? N  Some recent data might  be hidden    Patient Care Team: Glean Hess, MD as PCP - General (Family Medicine) Su Grand, MD as Consulting Physician (Oncology) Marcial Pacas, MD as Consulting Physician (Plastic Surgery)    Assessment:   This is a routine wellness examination for [facility].  Exercise Activities and Dietary recommendations Current Exercise Habits: Home exercise routine, Type of exercise: walking, Time (Minutes): 20, Frequency (Times/Week): 4, Weekly Exercise (Minutes/Week): 80, Intensity: Mild, Exercise limited by: None identified  Goals    . DIET - EAT MORE FRUITS AND VEGETABLES     Recommend to eat 3 small healthy meals and at least 2 healthy snacks per day.    . Happiness     Continue to be positive and keep family and herself happy while keeping grand kids and staying active.     . Increase water intake     Recommend to cut out sodas and replace with a minimum of 6-8 glasses per day.       Fall Risk Fall Risk  04/18/2019 04/18/2019 12/13/2018 04/14/2018 04/13/2017  Falls in the past year? 0 0 0 No No  Number falls in past yr: 0 0 0 - -  Injury with Fall? 0 0 0 - -  Risk for fall due to : - - - Medication side effect -  Follow up Falls prevention discussed Falls  prevention discussed Falls evaluation completed - -   FALL RISK PREVENTION PERTAINING TO THE HOME:  Any stairs in or around the home? Yes  If so, do they handrails? Yes   Home free of loose throw rugs in walkways, pet beds, electrical cords, etc? Yes  Adequate lighting in your home to reduce risk of falls? Yes   ASSISTIVE DEVICES UTILIZED TO PREVENT FALLS:  Life alert? No  Use of a cane, walker or w/c? No  Grab bars in the bathroom? No  Shower chair or bench in shower? Yes  Elevated toilet seat or a handicapped toilet? Yes   DME ORDERS:  DME order needed?  No   TIMED UP AND GO:  Was the test performed? No . Telephonic visit.    Education: Fall risk prevention has been discussed.  Intervention(s) required? No   Depression Screen PHQ 2/9 Scores 04/18/2019 12/13/2018 04/14/2018 04/13/2017  PHQ - 2 Score 0 0 0 0  PHQ- 9 Score - - 0 -     Cognitive Function     6CIT Screen 04/18/2019 04/14/2018 04/16/2016  What Year? 0 points 0 points 0 points  What month? 0 points 0 points 0 points  What time? 0 points 0 points 0 points  Count back from 20 0 points 0 points 0 points  Months in reverse 0 points 0 points 0 points  Repeat phrase 0 points 0 points 0 points  Total Score 0 0 0    Immunization History  Administered Date(s) Administered  . Influenza, High Dose Seasonal PF 03/05/2017, 03/11/2018  . Influenza-Unspecified 03/07/2014, 02/03/2017, 04/11/2019  . Pneumococcal Conjugate-13 03/14/2014, 09/17/2015  . Pneumococcal Polysaccharide-23 04/18/2002, 11/07/2010, 04/14/2018  . Tdap 11/24/2011  . Zoster 07/07/2008, 10/09/2014    Qualifies for Shingles Vaccine? Yes  Zostavax completed 2016. Due for Shingrix. Education has been provided regarding the importance of this vaccine. Pt has been advised to call insurance company to determine out of pocket expense. Advised may also receive vaccine at local pharmacy or Health Dept. Verbalized acceptance and understanding.  Tdap: Up to  date  Flu Vaccine: Up to date  Pneumococcal Vaccine: Up  to date    Screening Tests Health Maintenance  Topic Date Due  . Fecal DNA (Cologuard)  03/06/2020  . TETANUS/TDAP  11/23/2021  . INFLUENZA VACCINE  Completed  . PNA vac Low Risk Adult  Completed  . DEXA SCAN  Addressed  . Hepatitis C Screening  Addressed  . MAMMOGRAM  Discontinued    Cancer Screenings:  Colorectal Screening: pt refuses colonoscopy. Cologuard completed 03/06/17. Repeat in 3 years.   Mammogram: Completed 12/21/14. No longer required, bilateral mastectomy.  Bone Density: Completed 07/17/18. Results reflect OSTEOPENIA. Repeat every 2 years.   Lung Cancer Screening: (Low Dose CT Chest recommended if Age 63-80 years, 30 pack-year currently smoking OR have quit w/in 15years.) does not qualify.   Additional Screening:  Hepatitis C Screening: does qualify; Completed 01/09/15  Vision Screening: Recommended annual ophthalmology exams for early detection of glaucoma and other disorders of the eye. Is the patient up to date with their annual eye exam?  No  Who is the provider or what is the name of the office in which the pt attends annual eye exams? [facility] [place]  Dental Screening: Recommended annual dental exams for proper oral hygiene  Community Resource Referral:  CRR required this visit?  No      Plan:     I have personally reviewed and addressed the Medicare Annual Wellness questionnaire and have noted the following in the patient's chart:  A. Medical and social history B. Use of alcohol, tobacco or illicit drugs  C. Current medications and supplements D. Functional ability and status E.  Nutritional status F.  Physical activity G. Advance directives H. List of other physicians I.  Hospitalizations, surgeries, and ER visits in previous 12 months J.  [place] such as hearing and vision if needed, cognitive and depression L. Referrals and appointments   In addition, I have reviewed  and discussed with patient certain preventive protocols, quality metrics, and best practice recommendations. A written personalized care plan for preventive services as well as general preventive health recommendations were provided to patient.   Signed,  [name], LPN Nurse Health Advisor   Nurse Notes: pt dong well and appreciative of visit today

## 2019-04-18 NOTE — Patient Instructions (Signed)
Sandra Mckinney , Thank you for taking time to come for your Medicare Wellness Visit. I appreciate your ongoing commitment to your health goals. Please review the following plan we discussed and let me know if I can assist you in the future.   Screening recommendations/referrals: Colonoscopy: Cologuard one 03/06/17. Repeat in 2021. Bone Density: done 07/17/18. Recommended yearly ophthalmology/optometry visit for glaucoma screening and checkup Recommended yearly dental visit for hygiene and checkup  Vaccinations: Influenza vaccine: done 04/11/19 Pneumococcal vaccine: done 04/10/18 Tdap vaccine: done 11/24/11 Shingles vaccine: Shingrix discussed. Please contact your pharmacy for coverage information.     Advanced directives: Please bring a copy of your health care power of attorney and living will to the office at your convenience.  Conditions/risks identified: Keep up the great work!  Next appointment: Please follow up in one year for your Medicare Annual Wellness visit.     Preventive Care 28 Years and Older, Female Preventive care refers to lifestyle choices and visits with your health care provider that can promote health and wellness. What does preventive care include?  A yearly physical exam. This is also called an annual well check.  Dental exams once or twice a year.  Routine eye exams. Ask your health care provider how often you should have your eyes checked.  Personal lifestyle choices, including:  Daily care of your teeth and gums.  Regular physical activity.  Eating a healthy diet.  Avoiding tobacco and drug use.  Limiting alcohol use.  Practicing safe sex.  Taking low-dose aspirin every day.  Taking vitamin and mineral supplements as recommended by your health care provider. What happens during an annual well check? The services and screenings done by your health care provider during your annual well check will depend on your age, overall health, lifestyle  risk factors, and family history of disease. Counseling  Your health care provider may ask you questions about your:  Alcohol use.  Tobacco use.  Drug use.  Emotional well-being.  Home and relationship well-being.  Sexual activity.  Eating habits.  History of falls.  Memory and ability to understand (cognition).  Work and work Statistician.  Reproductive health. Screening  You may have the following tests or measurements:  Height, weight, and BMI.  Blood pressure.  Lipid and cholesterol levels. These may be checked every 5 years, or more frequently if you are over 82 years old.  Skin check.  Lung cancer screening. You may have this screening every year starting at age 12 if you have a 30-pack-year history of smoking and currently smoke or have quit within the past 15 years.  Fecal occult blood test (FOBT) of the stool. You may have this test every year starting at age 30.  Flexible sigmoidoscopy or colonoscopy. You may have a sigmoidoscopy every 5 years or a colonoscopy every 10 years starting at age 78.  Hepatitis C blood test.  Hepatitis B blood test.  Sexually transmitted disease (STD) testing.  Diabetes screening. This is done by checking your blood sugar (glucose) after you have not eaten for a while (fasting). You may have this done every 1-3 years.  Bone density scan. This is done to screen for osteoporosis. You may have this done starting at age 34.  Mammogram. This may be done every 1-2 years. Talk to your health care provider about how often you should have regular mammograms. Talk with your health care provider about your test results, treatment options, and if necessary, the need for more tests. Vaccines  Your  health care provider may recommend certain vaccines, such as:  Influenza vaccine. This is recommended every year.  Tetanus, diphtheria, and acellular pertussis (Tdap, Td) vaccine. You may need a Td booster every 10 years.  Zoster vaccine.  You may need this after age 72.  Pneumococcal 13-valent conjugate (PCV13) vaccine. One dose is recommended after age 31.  Pneumococcal polysaccharide (PPSV23) vaccine. One dose is recommended after age 69. Talk to your health care provider about which screenings and vaccines you need and how often you need them. This information is not intended to replace advice given to you by your health care provider. Make sure you discuss any questions you have with your health care provider. Document Released: 07/20/2015 Document Revised: 03/12/2016 Document Reviewed: 04/24/2015 Elsevier Interactive Patient Education  2017 [place] Prevention in the Home Falls can cause injuries. They can happen to people of all ages. There are many things you can do to make your home safe and to help prevent falls. What can I do on the outside of my home?  Regularly fix the edges of walkways and driveways and fix any cracks.  Remove anything that might make you trip as you walk through a door, such as a raised step or threshold.  Trim any bushes or trees on the path to your home.  Use bright outdoor lighting.  Clear any walking paths of anything that might make someone trip, such as rocks or tools.  Regularly check to see if handrails are loose or broken. Make sure that both sides of any steps have handrails.  Any raised decks and porches should have guardrails on the edges.  Have any leaves, snow, or ice cleared regularly.  Use sand or salt on walking paths during winter.  Clean up any spills in your garage right away. This includes oil or grease spills. What can I do in the bathroom?  Use night lights.  Install grab bars by the toilet and in the tub and shower. Do not use towel bars as grab bars.  Use non-skid mats or decals in the tub or shower.  If you need to sit down in the shower, use a plastic, non-slip stool.  Keep the floor dry. Clean up any water that spills on the floor as soon  as it happens.  Remove soap buildup in the tub or shower regularly.  Attach bath mats securely with double-sided non-slip rug tape.  Do not have throw rugs and other things on the floor that can make you trip. What can I do in the bedroom?  Use night lights.  Make sure that you have a light by your bed that is easy to reach.  Do not use any sheets or blankets that are too big for your bed. They should not hang down onto the floor.  Have a firm chair that has side arms. You can use this for support while you get dressed.  Do not have throw rugs and other things on the floor that can make you trip. What can I do in the kitchen?  Clean up any spills right away.  Avoid walking on wet floors.  Keep items that you use a lot in easy-to-reach places.  If you need to reach something above you, use a strong step stool that has a grab bar.  Keep electrical cords out of the way.  Do not use floor polish or wax that makes floors slippery. If you must use wax, use non-skid floor wax.  Do not  have throw rugs and other things on the floor that can make you trip. What can I do with my stairs?  Do not leave any items on the stairs.  Make sure that there are handrails on both sides of the stairs and use them. Fix handrails that are broken or loose. Make sure that handrails are as long as the stairways.  Check any carpeting to make sure that it is firmly attached to the stairs. Fix any carpet that is loose or worn.  Avoid having throw rugs at the top or bottom of the stairs. If you do have throw rugs, attach them to the floor with carpet tape.  Make sure that you have a light switch at the top of the stairs and the bottom of the stairs. If you do not have them, ask someone to add them for you. What else can I do to help prevent falls?  Wear shoes that:  Do not have high heels.  Have rubber bottoms.  Are comfortable and fit you well.  Are closed at the toe. Do not wear sandals.  If  you use a stepladder:  Make sure that it is fully opened. Do not climb a closed stepladder.  Make sure that both sides of the stepladder are locked into place.  Ask someone to hold it for you, if possible.  Clearly mark and make sure that you can see:  Any grab bars or handrails.  First and last steps.  Where the edge of each step is.  Use tools that help you move around (mobility aids) if they are needed. These include:  Canes.  Walkers.  Scooters.  Crutches.  Turn on the lights when you go into a dark area. Replace any light bulbs as soon as they burn out.  Set up your furniture so you have a clear path. Avoid moving your furniture around.  If any of your floors are uneven, fix them.  If there are any pets around you, be aware of where they are.  Review your medicines with your doctor. Some medicines can make you feel dizzy. This can increase your chance of falling. Ask your doctor what other things that you can do to help prevent falls. This information is not intended to replace advice given to you by your health care provider. Make sure you discuss any questions you have with your health care provider. Document Released: 04/19/2009 Document Revised: 11/29/2015 Document Reviewed: 07/28/2014 Elsevier Interactive Patient Education  2017 [employer].

## 2019-05-13 ENCOUNTER — Other Ambulatory Visit: Payer: Self-pay | Admitting: Internal Medicine

## 2019-05-13 ENCOUNTER — Telehealth: Payer: Self-pay

## 2019-05-13 DIAGNOSIS — M797 Fibromyalgia: Secondary | ICD-10-CM

## 2019-05-13 MED ORDER — HYDROCODONE-ACETAMINOPHEN 5-325 MG PO TABS: 1.0000 | ORAL_TABLET | Freq: Three times a day (TID) | ORAL | 0 refills | Status: DC

## 2019-05-13 NOTE — Telephone Encounter (Signed)
Patient called to request RF on hydrocodone. [facility] [place] [place]

## 2019-06-15 ENCOUNTER — Telehealth: Payer: Self-pay

## 2019-06-15 NOTE — Telephone Encounter (Signed)
Pt called requesting a RF on hydrocodone. [facility] [place]

## 2019-06-16 ENCOUNTER — Other Ambulatory Visit: Payer: Self-pay | Admitting: Internal Medicine

## 2019-06-16 DIAGNOSIS — M797 Fibromyalgia: Secondary | ICD-10-CM

## 2019-06-16 MED ORDER — HYDROCODONE-ACETAMINOPHEN 5-325 MG PO TABS: 1.0000 | ORAL_TABLET | Freq: Three times a day (TID) | ORAL | 0 refills | Status: DC

## 2019-06-22 DIAGNOSIS — Z9013 Acquired absence of bilateral breasts and nipples: Secondary | ICD-10-CM | POA: Insufficient documentation

## 2019-07-06 DIAGNOSIS — C50412 Malignant neoplasm of upper-outer quadrant of left female breast: Secondary | ICD-10-CM | POA: Diagnosis not present

## 2019-07-06 DIAGNOSIS — Z170 Estrogen receptor positive status [ER+]: Secondary | ICD-10-CM | POA: Diagnosis not present

## 2019-07-06 LAB — BASIC METABOLIC PANEL
BUN: 11 (ref 4–21)
Creatinine: 1 (ref 0.5–1.1)
Potassium: 4.3 (ref 3.4–5.3)
Sodium: 139 (ref 137–147)

## 2019-07-06 LAB — HEPATIC FUNCTION PANEL
ALT: 15 (ref 7–35)
AST: 37 — AB (ref 13–35)
Alkaline Phosphatase: 82 (ref 25–125)
Bilirubin, Total: 0.6

## 2019-07-06 LAB — CBC AND DIFFERENTIAL
HCT: 39 (ref 36–46)
Hemoglobin: 12.9 (ref 12.0–16.0)
Platelets: 230 (ref 150–399)
WBC: 10.3

## 2019-07-06 LAB — COMPREHENSIVE METABOLIC PANEL: GFR calc non Af Amer: 57

## 2019-07-12 DIAGNOSIS — Z170 Estrogen receptor positive status [ER+]: Secondary | ICD-10-CM | POA: Diagnosis not present

## 2019-07-12 DIAGNOSIS — C50412 Malignant neoplasm of upper-outer quadrant of left female breast: Secondary | ICD-10-CM | POA: Diagnosis not present

## 2019-07-14 ENCOUNTER — Telehealth: Payer: Self-pay

## 2019-07-14 ENCOUNTER — Other Ambulatory Visit: Payer: Self-pay | Admitting: Internal Medicine

## 2019-07-14 DIAGNOSIS — M797 Fibromyalgia: Secondary | ICD-10-CM

## 2019-07-14 MED ORDER — HYDROCODONE-ACETAMINOPHEN 5-325 MG PO TABS: 1.0000 | ORAL_TABLET | Freq: Three times a day (TID) | ORAL | 0 refills | Status: DC

## 2019-07-14 NOTE — Telephone Encounter (Signed)
Pt requesting RF on hydrocodone. [facility] [place].   Please advise.

## 2019-08-11 ENCOUNTER — Other Ambulatory Visit: Payer: Self-pay | Admitting: Internal Medicine

## 2019-08-11 ENCOUNTER — Telehealth: Payer: Self-pay

## 2019-08-11 DIAGNOSIS — M797 Fibromyalgia: Secondary | ICD-10-CM

## 2019-08-11 MED ORDER — HYDROCODONE-ACETAMINOPHEN 5-325 MG PO TABS: 1.0000 | ORAL_TABLET | Freq: Three times a day (TID) | ORAL | 0 refills | Status: DC

## 2019-08-11 NOTE — Telephone Encounter (Signed)
Patient called requesting a RF for hydrocodone for Saturday. [facility] [place].

## 2019-08-17 ENCOUNTER — Other Ambulatory Visit: Payer: Self-pay | Admitting: Internal Medicine

## 2019-09-12 ENCOUNTER — Other Ambulatory Visit: Payer: Self-pay | Admitting: Internal Medicine

## 2019-09-12 ENCOUNTER — Telehealth: Payer: Self-pay

## 2019-09-12 DIAGNOSIS — M797 Fibromyalgia: Secondary | ICD-10-CM

## 2019-09-12 MED ORDER — HYDROCODONE-ACETAMINOPHEN 5-325 MG PO TABS: 1.0000 | ORAL_TABLET | Freq: Three times a day (TID) | ORAL | 0 refills | Status: DC

## 2019-09-12 NOTE — Telephone Encounter (Signed)
Pt called requesting a refill of hydrocodone to be [facility] [place].

## 2019-10-03 DIAGNOSIS — Z9013 Acquired absence of bilateral breasts and nipples: Secondary | ICD-10-CM | POA: Diagnosis not present

## 2019-10-03 DIAGNOSIS — T8544XA Capsular contracture of breast implant, initial encounter: Secondary | ICD-10-CM | POA: Diagnosis not present

## 2019-10-11 ENCOUNTER — Telehealth: Payer: Self-pay

## 2019-10-11 ENCOUNTER — Other Ambulatory Visit: Payer: Self-pay | Admitting: Internal Medicine

## 2019-10-11 DIAGNOSIS — M797 Fibromyalgia: Secondary | ICD-10-CM

## 2019-10-11 MED ORDER — HYDROCODONE-ACETAMINOPHEN 5-325 MG PO TABS: 1.0000 | ORAL_TABLET | Freq: Three times a day (TID) | ORAL | 0 refills | Status: DC

## 2019-10-11 NOTE — Telephone Encounter (Signed)
Patient called to request RF for hydrocodone. [facility] [place].  Please Advise.  CM

## 2019-10-17 DIAGNOSIS — Z20822 Contact with and (suspected) exposure to covid-19: Secondary | ICD-10-CM | POA: Diagnosis not present

## 2019-10-17 DIAGNOSIS — Z01812 Encounter for preprocedural laboratory examination: Secondary | ICD-10-CM | POA: Diagnosis not present

## 2019-10-19 DIAGNOSIS — G4761 Periodic limb movement disorder: Secondary | ICD-10-CM | POA: Diagnosis not present

## 2019-10-19 DIAGNOSIS — Z853 Personal history of malignant neoplasm of breast: Secondary | ICD-10-CM | POA: Diagnosis not present

## 2019-10-19 DIAGNOSIS — Z9882 Breast implant status: Secondary | ICD-10-CM | POA: Diagnosis not present

## 2019-10-19 DIAGNOSIS — Z9013 Acquired absence of bilateral breasts and nipples: Secondary | ICD-10-CM | POA: Diagnosis not present

## 2019-10-19 DIAGNOSIS — N650 Deformity of reconstructed breast: Secondary | ICD-10-CM | POA: Diagnosis not present

## 2019-10-19 DIAGNOSIS — Z170 Estrogen receptor positive status [ER+]: Secondary | ICD-10-CM | POA: Diagnosis not present

## 2019-10-19 DIAGNOSIS — Z87891 Personal history of nicotine dependence: Secondary | ICD-10-CM | POA: Diagnosis not present

## 2019-10-19 DIAGNOSIS — I341 Nonrheumatic mitral (valve) prolapse: Secondary | ICD-10-CM | POA: Diagnosis not present

## 2019-10-19 DIAGNOSIS — T8544XD Capsular contracture of breast implant, subsequent encounter: Secondary | ICD-10-CM | POA: Diagnosis not present

## 2019-10-19 DIAGNOSIS — G2581 Restless legs syndrome: Secondary | ICD-10-CM | POA: Diagnosis not present

## 2019-10-19 DIAGNOSIS — T8544XA Capsular contracture of breast implant, initial encounter: Secondary | ICD-10-CM | POA: Diagnosis not present

## 2019-10-19 DIAGNOSIS — C50412 Malignant neoplasm of upper-outer quadrant of left female breast: Secondary | ICD-10-CM | POA: Diagnosis not present

## 2019-10-19 HISTORY — PX: BREAST REDUCTION SURGERY: SHX8

## 2019-10-19 HISTORY — PX: BREAST IMPLANT REMOVAL: SUR1101

## 2019-10-26 DIAGNOSIS — Z872 Personal history of diseases of the skin and subcutaneous tissue: Secondary | ICD-10-CM | POA: Diagnosis not present

## 2019-10-26 DIAGNOSIS — L578 Other skin changes due to chronic exposure to nonionizing radiation: Secondary | ICD-10-CM | POA: Diagnosis not present

## 2019-10-26 DIAGNOSIS — Z86018 Personal history of other benign neoplasm: Secondary | ICD-10-CM | POA: Diagnosis not present

## 2019-10-26 DIAGNOSIS — L918 Other hypertrophic disorders of the skin: Secondary | ICD-10-CM | POA: Diagnosis not present

## 2019-10-26 DIAGNOSIS — X32XXXS Exposure to sunlight, sequela: Secondary | ICD-10-CM | POA: Diagnosis not present

## 2019-10-26 DIAGNOSIS — L568 Other specified acute skin changes due to ultraviolet radiation: Secondary | ICD-10-CM | POA: Diagnosis not present

## 2019-11-10 ENCOUNTER — Other Ambulatory Visit: Payer: Self-pay | Admitting: Internal Medicine

## 2019-11-10 DIAGNOSIS — M797 Fibromyalgia: Secondary | ICD-10-CM

## 2019-11-10 NOTE — Telephone Encounter (Signed)
She has not been seen since June 2020.  She needs a visit since she gets the hydrocodone monthly.  She can do a virtual if desired.

## 2019-11-10 NOTE — Telephone Encounter (Signed)
Requested medication (s) are due for refill today: Yes  Requested medication (s) are on the active medication list: Yes  Last refill:  10/11/19  Future visit scheduled: No  Notes to clinic:  Pt. Recently had surgery. She is not sure when she id due to be seen in the practice again. Please advise. See medication refill request.    Requested Prescriptions  Pending Prescriptions Disp Refills   HYDROcodone-acetaminophen (NORCO/VICODIN) 5-325 MG tablet 90 tablet 0    Sig: Take 1 tablet by mouth 3 (three) times daily.      Not Delegated - Analgesics:  Opioid Agonist Combinations Failed - 11/10/2019  9:32 AM      Failed - This refill cannot be delegated      Failed - Urine Drug Screen completed in last 360 days.      Failed - Valid encounter within last 6 months    Recent Outpatient Visits           11 months ago Acute non-recurrent maxillary sinusitis   [facility] Glean Hess, MD   1 year ago Acute recurrent frontal sinusitis   [facility] Glean Hess, MD   1 year ago Acute non-recurrent maxillary sinusitis   [facility] Glean Hess, MD   2 years ago Acute sinusitis, recurrence not specified, unspecified location   [facility] Glean Hess, MD   3 years ago Fibromyalgia   [facility] Glean Hess, MD

## 2019-11-10 NOTE — Telephone Encounter (Signed)
Patient is coming in this Friday for med refill.

## 2019-11-10 NOTE — Telephone Encounter (Signed)
HYDROcodone-acetaminophen (NORCO/VICODIN) 5-325 MG tablet    Patient requesting refill.    [id number] [address], [place] Phone:  [phone]  Fax:  [phone]

## 2019-11-11 ENCOUNTER — Ambulatory Visit (INDEPENDENT_AMBULATORY_CARE_PROVIDER_SITE_OTHER): Payer: Medicare PPO | Admitting: Internal Medicine

## 2019-11-11 ENCOUNTER — Encounter: Payer: Self-pay | Admitting: Internal Medicine

## 2019-11-11 ENCOUNTER — Other Ambulatory Visit: Payer: Self-pay

## 2019-11-11 VITALS — HR 71 | Temp 97.3°F | Ht 62.0 in | Wt 158.0 lb

## 2019-11-11 DIAGNOSIS — F324 Major depressive disorder, single episode, in partial remission: Secondary | ICD-10-CM | POA: Diagnosis not present

## 2019-11-11 DIAGNOSIS — G2581 Restless legs syndrome: Secondary | ICD-10-CM

## 2019-11-11 DIAGNOSIS — R600 Localized edema: Secondary | ICD-10-CM

## 2019-11-11 DIAGNOSIS — M797 Fibromyalgia: Secondary | ICD-10-CM

## 2019-11-11 DIAGNOSIS — G43009 Migraine without aura, not intractable, without status migrainosus: Secondary | ICD-10-CM | POA: Diagnosis not present

## 2019-11-11 DIAGNOSIS — N951 Menopausal and female climacteric states: Secondary | ICD-10-CM | POA: Diagnosis not present

## 2019-11-11 MED ORDER — HYDROCODONE-ACETAMINOPHEN 5-325 MG PO TABS: 1.0000 | ORAL_TABLET | Freq: Three times a day (TID) | ORAL | 0 refills | Status: DC

## 2019-11-11 NOTE — Progress Notes (Signed)
Date:  11/11/2019   Name:  Sandra Mckinney   DOB:  July 25, 1945   MRN:  [id number]   Chief Complaint: Fibromyalgia (Follow up. RF on Hydrocodone. ) She currently recovering from breast implant removal.  She was having pain and it was decided to remove them.  She reports healing well and feeling much better.  Anxiety Presents for follow-up visit. Patient reports no excessive worry, nervous/anxious behavior, obsessions or restlessness. Symptoms occur most days. The quality of sleep is fair.   Compliance with medications: Buspar.  Migraine  This is a recurrent problem. Episode frequency: at least 8 months since her last migraine. The problem has been unchanged. Treatments tried: three excedrin as needed.  Fibromyalgia - on chronic hydrocodone three times a day along with gabapentin.  She is currently recovering from breast implant removal three weeks ago.  Restless leg syndrome - on Requip and doing well.  Sleep is good and rarely interrupted.  Lab Results  Component Value Date   CREATININE 1.00 12/06/2014   BUN 15 12/06/2014   NA 139 12/06/2014   K 4.6 12/06/2014   CL 98 12/06/2014   CO2 27 12/06/2014   Lab Results  Component Value Date   CHOL 208 (H) 04/16/2016   HDL 92 04/16/2016   LDLCALC 87 04/16/2016   TRIG 145 04/16/2016   CHOLHDL 2.3 04/16/2016   Lab Results  Component Value Date   TSH 0.841 04/16/2016   No results found for: HGBA1C Lab Results  Component Value Date   WBC 7.3 07/29/2012   HGB 11.6 (L) 07/29/2012   HCT 35.6 07/29/2012   MCV 88 07/29/2012   PLT 236 07/29/2012   No results found for: ALT, AST, GGT, ALKPHOS, BILITOT   Review of Systems  Psychiatric/Behavioral: The patient is not nervous/anxious.     Patient Active Problem List   Diagnosis Date Noted  . Acquired absence of both breasts 06/22/2019  . Fibromyalgia 07/29/2016  . Hyperlipidemia 05/20/2016  . Age-related osteoporosis without current pathological fracture 04/16/2016    . Anemia 12/12/2015  . Neoplasm of left breast, primary tumor staging category Tis: ductal carcinoma in situ (DCIS) 01/03/2015  . History of left breast cancer 01/03/2015  . Neoplasm of right breast, primary tumor staging category Tis: ductal carcinoma in situ (DCIS) 12/27/2014  . Anxiety disorder 10/11/2014  . Plantar fasciitis 10/11/2014  . Local edema 10/11/2014  . Depression, major, single episode, in partial remission ([place]) 10/11/2014  . Hot flash, menopausal 10/11/2014  . Migraine without aura and responsive to treatment 10/11/2014  . Idiopathic insomnia 10/11/2014  . Psoriasis 10/11/2014  . Restless leg 10/11/2014  . Avitaminosis D 10/11/2014    Allergies  Allergen Reactions  . Cephalexin Hives  . Letrozole Swelling  . Raloxifene Hives  . Selective Estrogen Receptor Modulators Hives  . Cephalosporins   . Duloxetine Hcl   . Lyrica [Pregabalin]   . Oxycodone   . Penicillins   . Prilosec  [Omeprazole]   . Sulfa Antibiotics     Past Surgical History:  Procedure Laterality Date  . ABDOMINAL HYSTERECTOMY  1986   uterine fibroids and endometriosis  . BREAST BIOPSY Right 06/01/12   [place] bx/clip-neg  . BREAST RECONSTRUCTION Bilateral 2017  . BREAST REDUCTION SURGERY  10/19/2019  . CARPAL TUNNEL RELEASE Right 2014  . EYE SURGERY Bilateral 2009   catarcts  . MASTECTOMY, RADICAL Bilateral 2016  . REDUCTION MAMMAPLASTY     2001  . THUMB ARTHROSCOPY Right 2014  Social History   Tobacco Use  . Smoking status: Former Smoker    Packs/day: 0.75    Years: 10.00    Pack years: 7.50    Types: Cigarettes    Quit date: 04/13/1974    Years since quitting: 45.6  . Smokeless tobacco: Never Used  . Tobacco comment: smoking cessation materials not required  Substance Use Topics  . Alcohol use: No    Alcohol/week: 0.0 standard drinks  . Drug use: No     Medication list has been reviewed and updated.  Current Meds  Medication Sig  . aspirin-acetaminophen-caffeine  (EXCEDRIN MIGRAINE) [id number] MG tablet Take by mouth every 6 (six) hours as needed for headache.  . busPIRone (BUSPAR) 10 MG tablet TAKE 1 TABLET FOUR TIMES DAILY (Patient taking differently: TAKE 1 TABLET FOUR TIMES DAILY FOR ANXIETY)  . Calcium-Magnesium-Vitamin D 600-40-500 MG-MG-UNIT TB24 Take 1 tablet by mouth daily.  . chlorpheniramine (CHLOR-TRIMETON) 4 MG tablet Take 4 mg by mouth daily.  . cholecalciferol (VITAMIN D) 1000 UNITS tablet Take 1 tablet by mouth daily.  . cloNIDine (CATAPRES) 0.1 MG tablet Take 1 tablet by mouth 2 (two) times daily. Hot Flashes.  . ferrous sulfate 325 (65 FE) MG tablet Take 325 mg by mouth daily with breakfast.  . furosemide (LASIX) 20 MG tablet TAKE 1 TABLET EVERY DAY (Patient taking differently: edema)  . gabapentin (NEURONTIN) 100 MG capsule TAKE 1 CAPSULE THREE TIMES DAILY (Patient taking differently: Nerve Pain, Fibromyalgia)  . HYDROcodone-acetaminophen (NORCO/VICODIN) 5-325 MG tablet Take 1 tablet by mouth every 6 (six) hours as needed for moderate pain. Fibromyalgia.  Marland Kitchen imiquimod (ALDARA) 5 % cream Apply topically 3 (three) times a week. Dr Phillip Heal.  . loperamide (IMODIUM A-D) 2 MG tablet Take 2 mg by mouth 4 (four) times daily as needed for diarrhea or loose stools.  . magnesium oxide (MAG-OX) 400 MG tablet Take 400 mg by mouth 2 (two) times daily.  . MULTIPLE VITAMINS-MINERALS PO Take 1 tablet by mouth daily.  . propranolol ER (INDERAL LA) 60 MG 24 hr capsule TAKE 1 CAPSULE EVERY DAY (Patient taking differently: FOR MIGRAINES)  . rOPINIRole (REQUIP) 0.5 MG tablet TAKE 7 TABLETS AT BEDTIME (Patient taking differently: No sig reported)    PHQ 2/9 Scores 11/11/2019 04/18/2019 12/13/2018 04/14/2018  PHQ - 2 Score 0 0 0 0  PHQ- 9 Score 0 - - 0  No flowsheet data found.    BP Readings from Last 3 Encounters:  12/13/18 110/61  04/14/18 122/70  04/16/16 122/84    Physical Exam Vitals and nursing note reviewed.  Constitutional:      General: She is  not in acute distress.    Appearance: Normal appearance. She is well-developed.  HENT:     Head: Normocephalic and atraumatic.  Neck:     Vascular: No carotid bruit.  Cardiovascular:     Rate and Rhythm: Normal rate and regular rhythm.     Pulses: Normal pulses.  Pulmonary:     Effort: Pulmonary effort is normal. No respiratory distress.     Breath sounds: No wheezing or rhonchi.  Musculoskeletal:     Cervical back: Normal range of motion.     Right lower leg: Edema present.     Left lower leg: Edema present.  Lymphadenopathy:     Cervical: No cervical adenopathy.  Skin:    General: Skin is warm and dry.     Capillary Refill: Capillary refill takes less than 2 seconds.     Findings:  No rash.  Neurological:     General: No focal deficit present.     Mental Status: She is alert and oriented to person, place, and time.  Psychiatric:        Mood and Affect: Mood normal.        Behavior: Behavior normal.        Thought Content: Thought content normal.     Wt Readings from Last 3 Encounters:  11/11/19 158 lb (71.7 kg)  04/18/19 151 lb 4.8 oz (68.6 kg)  12/13/18 158 lb 1.6 oz (71.7 kg)    Pulse 71   Temp (!) 97.3 F (36.3 C) (Temporal)   Ht 5\' 2"  (1.575 m)   Wt 158 lb (71.7 kg)   SpO2 99%   BMI 28.90 kg/m   Assessment and Plan: 1. Depression, major, single episode, in partial remission ([place]) Doing well - coping with Covid and recent health issues. On Buspar.  2. Restless leg Sleeping well with Requip. No side effects or worsening of symptoms.  3. Fibromyalgia Stable chronic generalized muscle pain On hydrocodone tid - no evidence of misuse or abuse - HYDROcodone-acetaminophen (NORCO/VICODIN) 5-325 MG tablet; Take 1 tablet by mouth 3 (three) times daily.  Dispense: 90 tablet; Refill: 0  4. Migraine without aura and responsive to treatment On Beta blocker daily for prevention Migraines occur rarely but response well to Excedrin migraine  5. Local edema Mild  chronic lower ext edema likely secondary to fibromyalgia She will continue Lasix as needed  6. Hot flash, menopausal Has been on bid clonidine for years No current hot flashes She will taper and discontinue   Partially dictated using Editor, commissioning. Any errors are unintentional.  Halina Maidens, MD [facility]  11/11/2019

## 2019-11-11 NOTE — Patient Instructions (Signed)
To wean and stop the clonidine:  Take one a day for one week, then one every other day for 1-2 weeks then stop.  Monitor your blood pressure to be sure that it does not shoot up.

## 2019-11-26 ENCOUNTER — Other Ambulatory Visit: Payer: Self-pay | Admitting: Internal Medicine

## 2019-11-26 NOTE — Telephone Encounter (Signed)
Requested Prescriptions  Pending Prescriptions Disp Refills  . furosemide (LASIX) 20 MG tablet [Pharmacy Med Name: FUROSEMIDE 20 MG Tablet] 90 tablet 1    Sig: TAKE 1 TABLET EVERY DAY     Cardiovascular:  Diuretics - Loop Failed - 11/26/2019  1:14 PM      Failed - K in normal range and within 360 days    Potassium  Date Value Ref Range Status  12/06/2014 4.6 3.5 - 5.2 mmol/L Final  07/29/2012 4.1 3.5 - 5.1 mmol/L Final         Failed - Ca in normal range and within 360 days    Calcium  Date Value Ref Range Status  12/06/2014 9.4 8.7 - 10.3 mg/dL Final   Calcium, Total  Date Value Ref Range Status  07/29/2012 9.1 8.5 - 10.1 mg/dL Final         Failed - Na in normal range and within 360 days    Sodium  Date Value Ref Range Status  12/06/2014 139 134 - 144 mmol/L Final  07/29/2012 140 136 - 145 mmol/L Final         Failed - Cr in normal range and within 360 days    Creatinine  Date Value Ref Range Status  07/29/2012 0.93 0.60 - 1.30 mg/dL Final   Creatinine, Ser  Date Value Ref Range Status  12/06/2014 1.00 0.57 - 1.00 mg/dL Final         Passed - Last BP in normal range    BP Readings from Last 1 Encounters:  12/13/18 110/61         Passed - Valid encounter within last 6 months    Recent Outpatient Visits          2 weeks ago Depression, major, single episode, in partial remission [facility])   [facility] Glean Hess, MD   11 months ago Acute non-recurrent maxillary sinusitis   [facility] Glean Hess, MD   1 year ago Acute recurrent frontal sinusitis   [facility] Glean Hess, MD   1 year ago Acute non-recurrent maxillary sinusitis   [facility] Glean Hess, MD   2 years ago Acute sinusitis, recurrence not specified, unspecified location   [facility] Glean Hess, MD      Future Appointments            In 4 months Army Melia Jesse Sans, MD [facility], [facility]

## 2019-12-12 ENCOUNTER — Other Ambulatory Visit: Payer: Self-pay | Admitting: Internal Medicine

## 2019-12-12 NOTE — Telephone Encounter (Signed)
Please call pt and inform her that [facility] has a new policy that requires her to have a drug screen while on medication once a year.  She must also sign a pain agreement.  Please schedule her so that we can get this done.  If needed, I will send her a one month supply with no refills.

## 2019-12-12 NOTE — Telephone Encounter (Signed)
Medication Refill - Medication: HYDROcodone-acetaminophen (NORCO/VICODIN) 5-325 MG tablet   Has the patient contacted their pharmacy? Yes.   (Agent: If no, request that the patient contact the pharmacy for the refill.) (Agent: If yes, when and what did the pharmacy advise?)  Preferred Pharmacy (with phone number or street name):  [id number] - [place], [address]  [place] [address]  Phone: [phone] Fax: [phone]     Agent: Please be advised that RX refills may take up to 3 business days. We ask that you follow-up with your pharmacy.

## 2019-12-12 NOTE — Telephone Encounter (Signed)
Requested medication (s) are due for refill today -unsure  Requested medication (s) are on the active medication list -yes  Future visit scheduled -yes  Last refill: unsure  Notes to clinic: Request for nondelegated medication listed as historical  Requested Prescriptions  Pending Prescriptions Disp Refills   HYDROcodone-acetaminophen (NORCO/VICODIN) 5-325 MG tablet 30 tablet     Sig: Take 1 tablet by mouth every 6 (six) hours as needed for moderate pain. Fibromyalgia.      Not Delegated - Analgesics:  Opioid Agonist Combinations Failed - 12/12/2019 11:02 AM      Failed - This refill cannot be delegated      Failed - Urine Drug Screen completed in last 360 days.      Passed - Valid encounter within last 6 months    Recent Outpatient Visits           1 month ago Depression, major, single episode, in partial remission [facility])   [facility] Glean Hess, MD   12 months ago Acute non-recurrent maxillary sinusitis   [facility] Glean Hess, MD   1 year ago Acute recurrent frontal sinusitis   [facility] Glean Hess, MD   1 year ago Acute non-recurrent maxillary sinusitis   [facility] Glean Hess, MD   2 years ago Acute sinusitis, recurrence not specified, unspecified location   [facility] Glean Hess, MD       Future Appointments             In 4 months Glean Hess, MD [facility], [facility]                Requested Prescriptions  Pending Prescriptions Disp Refills   HYDROcodone-acetaminophen (NORCO/VICODIN) 5-325 MG tablet 30 tablet     Sig: Take 1 tablet by mouth every 6 (six) hours as needed for moderate pain. Fibromyalgia.      Not Delegated - Analgesics:  Opioid Agonist Combinations Failed - 12/12/2019 11:02 AM      Failed - This refill cannot be delegated      Failed - Urine Drug Screen completed in last 360 days.      Passed - Valid encounter within last 6 months   Recent Outpatient Visits           1 month ago Depression, major, single episode, in partial remission [facility])   [facility] Glean Hess, MD   12 months ago Acute non-recurrent maxillary sinusitis   [facility] Glean Hess, MD   1 year ago Acute recurrent frontal sinusitis   [facility] Glean Hess, MD   1 year ago Acute non-recurrent maxillary sinusitis   [facility] Glean Hess, MD   2 years ago Acute sinusitis, recurrence not specified, unspecified location   [facility] Glean Hess, MD       Future Appointments             In 4 months Army Melia Jesse Sans, MD [facility], [facility]

## 2019-12-12 NOTE — Telephone Encounter (Signed)
Please Advise.  KP

## 2019-12-13 MED ORDER — HYDROCODONE-ACETAMINOPHEN 5-325 MG PO TABS: 1.0000 | ORAL_TABLET | Freq: Four times a day (QID) | ORAL | 0 refills | Status: DC | PRN

## 2019-12-13 NOTE — Telephone Encounter (Signed)
Pt scheduled appt for tomorrow. She also [facility] [place]   KP

## 2019-12-14 ENCOUNTER — Encounter: Payer: Self-pay | Admitting: Internal Medicine

## 2019-12-14 ENCOUNTER — Ambulatory Visit (INDEPENDENT_AMBULATORY_CARE_PROVIDER_SITE_OTHER): Payer: Medicare PPO | Admitting: Internal Medicine

## 2019-12-14 ENCOUNTER — Other Ambulatory Visit: Payer: Self-pay

## 2019-12-14 VITALS — BP 124/82 | HR 67 | Ht 62.0 in | Wt 153.0 lb

## 2019-12-14 DIAGNOSIS — M797 Fibromyalgia: Secondary | ICD-10-CM

## 2019-12-14 DIAGNOSIS — F1190 Opioid use, unspecified, uncomplicated: Secondary | ICD-10-CM | POA: Diagnosis not present

## 2019-12-14 DIAGNOSIS — Z79891 Long term (current) use of opiate analgesic: Secondary | ICD-10-CM

## 2019-12-14 NOTE — Progress Notes (Signed)
Date:  12/14/2019   Name:  Sandra Mckinney   DOB:  03-Aug-1945   MRN:  [id number]   Chief Complaint: Fibromyalgia (Drug screen and pain management contract.)  HPI  Fibromyalgia - pt has been on vicodin 3 per day for many years.  No evidence of misuse, abuse or diversion.  She is here today to complete a pain contract and have an annual drug screen.   Lab Results  Component Value Date   CREATININE 1.00 12/06/2014   BUN 15 12/06/2014   NA 139 12/06/2014   K 4.6 12/06/2014   CL 98 12/06/2014   CO2 27 12/06/2014   Lab Results  Component Value Date   CHOL 208 (H) 04/16/2016   HDL 92 04/16/2016   LDLCALC 87 04/16/2016   TRIG 145 04/16/2016   CHOLHDL 2.3 04/16/2016   Lab Results  Component Value Date   TSH 0.841 04/16/2016   No results found for: HGBA1C Lab Results  Component Value Date   WBC 7.3 07/29/2012   HGB 11.6 (L) 07/29/2012   HCT 35.6 07/29/2012   MCV 88 07/29/2012   PLT 236 07/29/2012   No results found for: ALT, AST, GGT, ALKPHOS, BILITOT   Review of Systems  Constitutional: Negative for chills, fatigue and fever.  Respiratory: Negative for cough, chest tightness, shortness of breath and wheezing.   Cardiovascular: Negative for chest pain and palpitations.  Musculoskeletal: Positive for myalgias.  Neurological: Negative for dizziness, light-headedness and headaches.    Patient Active Problem List   Diagnosis Date Noted  . Acquired absence of both breasts 06/22/2019  . Fibromyalgia 07/29/2016  . Hyperlipidemia 05/20/2016  . Age-related osteoporosis without current pathological fracture 04/16/2016  . Anemia 12/12/2015  . Neoplasm of left breast, primary tumor staging category Tis: ductal carcinoma in situ (DCIS) 01/03/2015  . History of left breast cancer 01/03/2015  . Neoplasm of right breast, primary tumor staging category Tis: ductal carcinoma in situ (DCIS) 12/27/2014  . Anxiety disorder 10/11/2014  . Plantar fasciitis 10/11/2014  .  Local edema 10/11/2014  . Depression, major, single episode, in partial remission ([place]) 10/11/2014  . Hot flash, menopausal 10/11/2014  . Migraine without aura and responsive to treatment 10/11/2014  . Idiopathic insomnia 10/11/2014  . Psoriasis 10/11/2014  . Restless leg 10/11/2014  . Avitaminosis D 10/11/2014    Allergies  Allergen Reactions  . Cephalexin Hives  . Letrozole Swelling  . Raloxifene Hives  . Selective Estrogen Receptor Modulators Hives  . Cephalosporins   . Duloxetine Hcl   . Lyrica [Pregabalin]   . Oxycodone   . Penicillins   . Prilosec  [Omeprazole]   . Sulfa Antibiotics     Past Surgical History:  Procedure Laterality Date  . ABDOMINAL HYSTERECTOMY  1986   uterine fibroids and endometriosis  . BREAST BIOPSY Right 06/01/12   [place] bx/clip-neg  . BREAST IMPLANT REMOVAL Bilateral 10/19/2019  . BREAST RECONSTRUCTION Bilateral 2017  . BREAST REDUCTION SURGERY  10/19/2019  . CARPAL TUNNEL RELEASE Right 2014  . EYE SURGERY Bilateral 2009   catarcts  . MASTECTOMY, RADICAL Bilateral 2016  . REDUCTION MAMMAPLASTY     2001  . THUMB ARTHROSCOPY Right 2014    Social History   Tobacco Use  . Smoking status: Former Smoker    Packs/day: 0.75    Years: 10.00    Pack years: 7.50    Types: Cigarettes    Quit date: 04/13/1974    Years since quitting: 45.7  . Smokeless  tobacco: Never Used  . Tobacco comment: smoking cessation materials not required  Substance Use Topics  . Alcohol use: No    Alcohol/week: 0.0 standard drinks  . Drug use: No     Medication list has been reviewed and updated.  Current Meds  Medication Sig  . aspirin-acetaminophen-caffeine (EXCEDRIN MIGRAINE) 025-427-06 MG tablet Take by mouth every 6 (six) hours as needed for headache.  . busPIRone (BUSPAR) 10 MG tablet TAKE 1 TABLET FOUR TIMES DAILY (Patient taking differently: TAKE 1 TABLET FOUR TIMES DAILY FOR ANXIETY)  . Calcium-Magnesium-Vitamin D 600-40-500 MG-MG-UNIT TB24 Take 1  tablet by mouth daily.  . chlorpheniramine (CHLOR-TRIMETON) 4 MG tablet Take 4 mg by mouth daily.  . cholecalciferol (VITAMIN D) 1000 UNITS tablet Take 1 tablet by mouth daily.  . cloNIDine (CATAPRES) 0.1 MG tablet Take 1 tablet by mouth 2 (two) times daily. Hot Flashes.  . ferrous sulfate 325 (65 FE) MG tablet Take 325 mg by mouth daily with breakfast.  . furosemide (LASIX) 20 MG tablet TAKE 1 TABLET EVERY DAY  . gabapentin (NEURONTIN) 100 MG capsule TAKE 1 CAPSULE THREE TIMES DAILY (Patient taking differently: Nerve Pain, Fibromyalgia)  . HYDROcodone-acetaminophen (NORCO/VICODIN) 5-325 MG tablet Take 1 tablet by mouth every 6 (six) hours as needed for moderate pain. Fibromyalgia.  Marland Kitchen imiquimod (ALDARA) 5 % cream Apply topically 3 (three) times a week. Dr Phillip Heal.  . loperamide (IMODIUM A-D) 2 MG tablet Take 2 mg by mouth 4 (four) times daily as needed for diarrhea or loose stools.  . magnesium oxide (MAG-OX) 400 MG tablet Take 400 mg by mouth 2 (two) times daily.  . MULTIPLE VITAMINS-MINERALS PO Take 1 tablet by mouth daily.  . propranolol ER (INDERAL LA) 60 MG 24 hr capsule TAKE 1 CAPSULE EVERY DAY (Patient taking differently: FOR MIGRAINES)  . rOPINIRole (REQUIP) 0.5 MG tablet TAKE 7 TABLETS AT BEDTIME (Patient taking differently: No sig reported)    PHQ 2/9 Scores 12/14/2019 11/11/2019 04/18/2019 12/13/2018  PHQ - 2 Score 0 0 0 0  PHQ- 9 Score 0 0 - -    BP Readings from Last 3 Encounters:  12/14/19 124/82  12/13/18 110/61  04/14/18 122/70    Physical Exam Vitals and nursing note reviewed.  Constitutional:      General: She is not in acute distress.    Appearance: She is well-developed.  HENT:     Head: Normocephalic and atraumatic.  Cardiovascular:     Rate and Rhythm: Normal rate and regular rhythm.  Pulmonary:     Effort: Pulmonary effort is normal. No respiratory distress.     Breath sounds: No wheezing or rhonchi.  Chest:     Breasts:        Right: Absent.        Left:  Absent.     Comments: Surgical wounds healing Musculoskeletal:        General: Normal range of motion.  Skin:    General: Skin is warm and dry.     Findings: No rash.  Neurological:     Mental Status: She is alert and oriented to person, place, and time.  Psychiatric:        Behavior: Behavior normal.        Thought Content: Thought content normal.     Wt Readings from Last 3 Encounters:  12/14/19 153 lb (69.4 kg)  11/11/19 158 lb (71.7 kg)  04/18/19 151 lb 4.8 oz (68.6 kg)    BP 124/82 (BP Location: Right Leg, Patient  Position: Sitting, Cuff Size: Normal)   Pulse 67   Ht 5\' 2"  (1.575 m)   Wt 153 lb (69.4 kg)   SpO2 97%   BMI 27.98 kg/m   Assessment and Plan: 1. Fibromyalgia Continue hydrocodone tid  Pain agreement signed and scanned into chart  2. Chronic narcotic use - Drug Screen, Urine  3. Narcotic use agreement exists    Partially dictated using Editor, commissioning. Any errors are unintentional.  Halina Maidens, MD [facility]  12/14/2019

## 2019-12-15 LAB — DRUG SCREEN, URINE
Amphetamines, Urine: NEGATIVE ng/mL
Barbiturate screen, urine: NEGATIVE ng/mL
Benzodiazepine Quant, Ur: NEGATIVE ng/mL
Cannabinoid Quant, Ur: NEGATIVE ng/mL
Cocaine (Metab.): NEGATIVE ng/mL
Opiate Quant, Ur: POSITIVE ng/mL — AB
PCP Quant, Ur: NEGATIVE ng/mL

## 2020-01-11 ENCOUNTER — Ambulatory Visit (INDEPENDENT_AMBULATORY_CARE_PROVIDER_SITE_OTHER): Payer: Medicare PPO | Admitting: Internal Medicine

## 2020-01-11 ENCOUNTER — Encounter: Payer: Self-pay | Admitting: Internal Medicine

## 2020-01-11 ENCOUNTER — Other Ambulatory Visit: Payer: Self-pay

## 2020-01-11 VITALS — HR 64 | Temp 98.7°F | Ht 62.0 in | Wt 158.0 lb

## 2020-01-11 DIAGNOSIS — F324 Major depressive disorder, single episode, in partial remission: Secondary | ICD-10-CM | POA: Diagnosis not present

## 2020-01-11 DIAGNOSIS — M797 Fibromyalgia: Secondary | ICD-10-CM

## 2020-01-11 DIAGNOSIS — R413 Other amnesia: Secondary | ICD-10-CM | POA: Diagnosis not present

## 2020-01-11 MED ORDER — HYDROCODONE-ACETAMINOPHEN 5-325 MG PO TABS: 1.0000 | ORAL_TABLET | Freq: Four times a day (QID) | ORAL | 0 refills | Status: DC | PRN

## 2020-01-11 NOTE — Progress Notes (Signed)
Date:  01/11/2020   Name:  Sandra Mckinney   DOB:  [birth year]   MRN:  [id number]   Chief Complaint: Memory Loss (XSept 2019 first time husband noticed it, not rembering things that happened in the past, short term memory loss  ) and Anxiety (since anesthsia ) She has an event around 2014 where local anesthesia was injected into her vein, resulting in a cardiac arrest.  Since that time her husband says that she has memory loss and personality change.  He states that the first real noticeable event was about 2 years ago when she could not remember that he was a hour late for their wedding. She has trouble recognizing where she is going when travelling by car.  She gets very anxious and agitated at times. She is often very irritable with him and one on occasion even cursed at him which is very atypical. She rarely drives now.  Has spent all of the past 18 months sequestered at home.  However, now the family is becoming more concerned about her memory. Her medications remain unchanged for a number of months.  Depression        This is a chronic problem.The problem is unchanged.  Associated symptoms include myalgias.  Associated symptoms include no fatigue and no headaches.  Treatments tried: Bupsar currently/previously on Lamictal. Fibromyalgia - she is on vicodin tid for fibromyalgia pain, along with gabapentin.  She has signed pain contract on her chart.  UDS was done in June 2021.   Lab Results  Component Value Date   CREATININE 1.00 12/06/2014   BUN 15 12/06/2014   NA 139 12/06/2014   K 4.6 12/06/2014   CL 98 12/06/2014   CO2 27 12/06/2014   Lab Results  Component Value Date   CHOL 208 (H) 04/16/2016   HDL 92 04/16/2016   LDLCALC 87 04/16/2016   TRIG 145 04/16/2016   CHOLHDL 2.3 04/16/2016   Lab Results  Component Value Date   TSH 0.841 04/16/2016   No results found for: HGBA1C Lab Results  Component Value Date   WBC 7.3 07/29/2012   HGB 11.6 (L) 07/29/2012    HCT 35.6 07/29/2012   MCV 88 07/29/2012   PLT 236 07/29/2012   No results found for: ALT, AST, GGT, ALKPHOS, BILITOT   Review of Systems  Constitutional: Negative for chills, fatigue and fever.  Respiratory: Negative for cough, chest tightness and wheezing.   Cardiovascular: Negative for chest pain.  Gastrointestinal: Negative for abdominal pain.  Musculoskeletal: Positive for arthralgias and myalgias.  Neurological: Negative for dizziness, light-headedness and headaches.  Psychiatric/Behavioral: Positive for confusion and depression. Negative for dysphoric mood and sleep disturbance. The patient is nervous/anxious.     Patient Active Problem List   Diagnosis Date Noted  . Narcotic use agreement exists 12/14/2019  . Acquired absence of both breasts 06/22/2019  . Fibromyalgia 07/29/2016  . Hyperlipidemia 05/20/2016  . Age-related osteoporosis without current pathological fracture 04/16/2016  . Anemia 12/12/2015  . Neoplasm of left breast, primary tumor staging category Tis: ductal carcinoma in situ (DCIS) 01/03/2015  . History of left breast cancer 01/03/2015  . Neoplasm of right breast, primary tumor staging category Tis: ductal carcinoma in situ (DCIS) 12/27/2014  . Anxiety disorder 10/11/2014  . Plantar fasciitis 10/11/2014  . Local edema 10/11/2014  . Depression, major, single episode, in partial remission ([place]) 10/11/2014  . Hot flash, menopausal 10/11/2014  . Migraine without aura and responsive to treatment 10/11/2014  . Idiopathic  insomnia 10/11/2014  . Psoriasis 10/11/2014  . Restless leg 10/11/2014  . Avitaminosis D 10/11/2014    Allergies  Allergen Reactions  . Cephalexin Hives  . Letrozole Swelling  . Raloxifene Hives  . Selective Estrogen Receptor Modulators Hives  . Cephalosporins   . Duloxetine Hcl   . Lyrica [Pregabalin]   . Oxycodone   . Penicillins   . Prilosec  [Omeprazole]   . Sulfa Antibiotics     Past Surgical History:  Procedure Laterality  Date  . ABDOMINAL HYSTERECTOMY  1986   uterine fibroids and endometriosis  . BREAST BIOPSY Right 06/01/12   [place] bx/clip-neg  . BREAST IMPLANT REMOVAL Bilateral 10/19/2019  . BREAST RECONSTRUCTION Bilateral 2017  . BREAST REDUCTION SURGERY  10/19/2019   all breast reconstructions removed  . CARPAL TUNNEL RELEASE Right 2014  . EYE SURGERY Bilateral 2009   catarcts  . MASTECTOMY, RADICAL Bilateral 2016  . REDUCTION MAMMAPLASTY     2001  . THUMB ARTHROSCOPY Right 2014    Social History   Tobacco Use  . Smoking status: Former Smoker    Packs/day: 0.75    Years: 10.00    Pack years: 7.50    Types: Cigarettes    Quit date: 04/13/1974    Years since quitting: 45.7  . Smokeless tobacco: Never Used  . Tobacco comment: smoking cessation materials not required  Vaping Use  . Vaping Use: Never used  Substance Use Topics  . Alcohol use: No    Alcohol/week: 0.0 standard drinks  . Drug use: No     Medication list has been reviewed and updated.  Current Meds  Medication Sig  . aspirin-acetaminophen-caffeine (EXCEDRIN MIGRAINE) 810-175-10 MG tablet Take by mouth every 6 (six) hours as needed for headache.  . busPIRone (BUSPAR) 10 MG tablet TAKE 1 TABLET FOUR TIMES DAILY (Patient taking differently: TAKE 1 TABLET FOUR TIMES DAILY FOR ANXIETY)  . Calcium-Magnesium-Vitamin D 600-40-500 MG-MG-UNIT TB24 Take 1 tablet by mouth daily.  . chlorpheniramine (CHLOR-TRIMETON) 4 MG tablet Take 4 mg by mouth daily.  . cholecalciferol (VITAMIN D) 1000 UNITS tablet Take 1 tablet by mouth daily.  . ferrous sulfate 325 (65 FE) MG tablet Take 325 mg by mouth daily with breakfast.  . furosemide (LASIX) 20 MG tablet TAKE 1 TABLET EVERY DAY  . gabapentin (NEURONTIN) 100 MG capsule TAKE 1 CAPSULE THREE TIMES DAILY (Patient taking differently: Nerve Pain, Fibromyalgia)  . HYDROcodone-acetaminophen (NORCO/VICODIN) 5-325 MG tablet Take 1 tablet by mouth every 6 (six) hours as needed for moderate pain.  Fibromyalgia.  Marland Kitchen imiquimod (ALDARA) 5 % cream Apply topically 3 (three) times a week. Dr Phillip Heal.  . loperamide (IMODIUM A-D) 2 MG tablet Take 2 mg by mouth 4 (four) times daily as needed for diarrhea or loose stools.  . magnesium oxide (MAG-OX) 400 MG tablet Take 400 mg by mouth 2 (two) times daily.  . MULTIPLE VITAMINS-MINERALS PO Take 1 tablet by mouth daily.  . propranolol ER (INDERAL LA) 60 MG 24 hr capsule TAKE 1 CAPSULE EVERY DAY (Patient taking differently: FOR MIGRAINES)  . rOPINIRole (REQUIP) 0.5 MG tablet TAKE 7 TABLETS AT BEDTIME (Patient taking differently: No sig reported)  . [DISCONTINUED] HYDROcodone-acetaminophen (NORCO/VICODIN) 5-325 MG tablet Take 1 tablet by mouth every 6 (six) hours as needed for moderate pain. Fibromyalgia.    PHQ 2/9 Scores 01/11/2020 12/14/2019 11/11/2019 04/18/2019  PHQ - 2 Score 0 0 0 0  PHQ- 9 Score 1 0 0 -    GAD 7 : Generalized  Anxiety Score 01/11/2020 12/14/2019  Nervous, Anxious, on Edge 2 0  Control/stop worrying 0 0  Worry too much - different things 0 0  Trouble relaxing 0 0  Restless 0 0  Easily annoyed or irritable 2 0  Afraid - awful might happen 0 0  Total GAD 7 Score 4 0  Anxiety Difficulty Not difficult at all Not difficult at all   6CIT Screen 01/11/2020 04/18/2019 04/14/2018 04/16/2016  What Year? 0 points 0 points 0 points 0 points  What month? 0 points 0 points 0 points 0 points  What time? 0 points 0 points 0 points 0 points  Count back from 20 0 points 0 points 0 points 0 points  Months in reverse 0 points 0 points 0 points 0 points  Repeat phrase 0 points 0 points 0 points 0 points  Total Score 0 0 0 0     BP Readings from Last 3 Encounters:  12/14/19 124/82  12/13/18 110/61  04/14/18 122/70    Physical Exam Vitals and nursing note reviewed.  Constitutional:      General: She is not in acute distress.    Appearance: Normal appearance. She is well-developed.  HENT:     Head: Normocephalic and atraumatic.   Cardiovascular:     Rate and Rhythm: Normal rate and regular rhythm.     Pulses: Normal pulses.     Heart sounds: No murmur heard.   Pulmonary:     Effort: Pulmonary effort is normal. No respiratory distress.     Breath sounds: No wheezing or rhonchi.  Musculoskeletal:     Cervical back: Normal range of motion.     Right lower leg: No edema.     Left lower leg: No edema.  Lymphadenopathy:     Cervical: No cervical adenopathy.  Skin:    General: Skin is warm and dry.     Capillary Refill: Capillary refill takes less than 2 seconds.     Findings: No rash.  Neurological:     General: No focal deficit present.     Mental Status: She is alert and oriented to person, place, and time.  Psychiatric:        Behavior: Behavior normal.        Thought Content: Thought content normal.     Wt Readings from Last 3 Encounters:  01/11/20 158 lb (71.7 kg)  12/14/19 153 lb (69.4 kg)  11/11/19 158 lb (71.7 kg)    Pulse 64   Temp 98.7 F (37.1 C) (Oral)   Ht 5\' 2"  (1.575 m)   Wt 158 lb (71.7 kg)   SpO2 96%   BMI 28.90 kg/m   Assessment and Plan: 1. Depression, major, single episode, in partial remission ([place]) Doing well on Bupsar Husband relates a diagnosis of Bipolar years ago and was previously Lamictal but stopped it on her own. - TSH + free T4  2. Fibromyalgia Medications refilled UDS done last month and was consistent with prescriptions - HYDROcodone-acetaminophen (NORCO/VICODIN) 5-325 MG tablet; Take 1 tablet by mouth every 6 (six) hours as needed for moderate pain. Fibromyalgia.  Dispense: 90 tablet; Refill: 0  3. Memory changes Labs needed and Neurology referral. - CBC with Differential/Platelet - Comprehensive metabolic panel - Vitamin D40 - Sedimentation rate - Ambulatory referral to Neurology  Pt was sent to [facility] for labs but declined to have blood drawn from her arms.  She was then sent to the [facility] lab to see if they could draw from her foot  but she would not  allow tourniquet.  Ultimately let without getting labs drawn.  A message was sent via Mychart for her to get permission from her Oncologist to have labs drawn from her arm.  Partially dictated using Editor, commissioning. Any errors are unintentional.  Halina Maidens, MD [facility]  01/11/2020

## 2020-01-16 ENCOUNTER — Other Ambulatory Visit: Payer: Self-pay | Admitting: Internal Medicine

## 2020-01-16 DIAGNOSIS — G2581 Restless legs syndrome: Secondary | ICD-10-CM

## 2020-01-16 NOTE — Telephone Encounter (Signed)
Requested Prescriptions  Pending Prescriptions Disp Refills  . propranolol ER (INDERAL LA) 60 MG 24 hr capsule [Pharmacy Med Name: PROPRANOLOL HYDROCHLORIDEER 60 MG Capsule Extended Release 24 Hour] 90 capsule 1    Sig: TAKE 1 CAPSULE EVERY DAY     Cardiovascular:  Beta Blockers Passed - 01/16/2020  1:23 PM      Passed - Last BP in normal range    BP Readings from Last 1 Encounters:  12/14/19 124/82         Passed - Last Heart Rate in normal range    Pulse Readings from Last 1 Encounters:  01/11/20 64         Passed - Valid encounter within last 6 months    Recent Outpatient Visits          5 days ago Depression, major, single episode, in partial remission [facility])   [facility]   1 month ago [facility], [facility]   2 months ago Depression, major, single episode, in partial remission [facility])   [facility], [facility]   1 year ago Acute non-recurrent maxillary sinusitis   [facility]   1 year ago Acute recurrent frontal sinusitis   [facility]      Future Appointments            In 3 months Glean Hess, MD [facility], [facility]           . rOPINIRole (REQUIP) 0.5 MG tablet [Pharmacy Med Name: ROPINIROLE HYDROCHLORIDE 0.5 MG Tablet] 630 tablet 1    Sig: TAKE 7 TABLETS AT BEDTIME     Neurology:  Parkinsonian Agents Passed - 01/16/2020  1:23 PM      Passed - Last BP in normal range    BP Readings from Last 1 Encounters:  12/14/19 124/82         Passed - Valid encounter within last 12 months    Recent Outpatient Visits          5 days ago Depression, major, single episode, in partial remission [facility])   [facility] Glean [facility], MD   1 month ago [place], Laura H, MD   2 months ago Depression, major, single episode, in partial remission [facility])   [facility], [facility]   1 year ago Acute non-recurrent maxillary sinusitis   [facility], [facility]   1 year ago Acute recurrent frontal sinusitis   [facility] Gle[facility], [facility]      Future Appointments            In 3 months Army Melia Jesse Sans, MD [facility], [facility]

## 2020-01-17 ENCOUNTER — Encounter: Payer: Self-pay | Admitting: Internal Medicine

## 2020-02-08 DIAGNOSIS — R7309 Other abnormal glucose: Secondary | ICD-10-CM | POA: Diagnosis not present

## 2020-02-08 DIAGNOSIS — Z131 Encounter for screening for diabetes mellitus: Secondary | ICD-10-CM | POA: Diagnosis not present

## 2020-02-08 DIAGNOSIS — G3184 Mild cognitive impairment, so stated: Secondary | ICD-10-CM | POA: Insufficient documentation

## 2020-02-08 DIAGNOSIS — E519 Thiamine deficiency, unspecified: Secondary | ICD-10-CM | POA: Diagnosis not present

## 2020-02-08 DIAGNOSIS — E538 Deficiency of other specified B group vitamins: Secondary | ICD-10-CM | POA: Diagnosis not present

## 2020-02-08 DIAGNOSIS — E611 Iron deficiency: Secondary | ICD-10-CM | POA: Diagnosis not present

## 2020-02-08 DIAGNOSIS — R413 Other amnesia: Secondary | ICD-10-CM | POA: Insufficient documentation

## 2020-02-08 DIAGNOSIS — E559 Vitamin D deficiency, unspecified: Secondary | ICD-10-CM | POA: Diagnosis not present

## 2020-02-08 DIAGNOSIS — E038 Other specified hypothyroidism: Secondary | ICD-10-CM | POA: Diagnosis not present

## 2020-02-08 LAB — TSH: TSH: 0.43 (ref 0.41–5.90)

## 2020-02-08 LAB — HEMOGLOBIN A1C: Hemoglobin A1C: 5.8

## 2020-02-08 LAB — VITAMIN D 25 HYDROXY (VIT D DEFICIENCY, FRACTURES): Vit D, 25-Hydroxy: 30

## 2020-02-08 LAB — VITAMIN B12: Vitamin B-12: 303

## 2020-02-09 ENCOUNTER — Other Ambulatory Visit: Payer: Self-pay | Admitting: Acute Care

## 2020-02-09 ENCOUNTER — Other Ambulatory Visit (HOSPITAL_COMMUNITY): Payer: Self-pay | Admitting: Acute Care

## 2020-02-09 DIAGNOSIS — I639 Cerebral infarction, unspecified: Secondary | ICD-10-CM

## 2020-02-10 ENCOUNTER — Other Ambulatory Visit: Payer: Self-pay | Admitting: Internal Medicine

## 2020-02-10 DIAGNOSIS — M797 Fibromyalgia: Secondary | ICD-10-CM

## 2020-02-10 MED ORDER — HYDROCODONE-ACETAMINOPHEN 5-325 MG PO TABS: 1.0000 | ORAL_TABLET | Freq: Four times a day (QID) | ORAL | 0 refills | Status: DC | PRN

## 2020-02-10 NOTE — Telephone Encounter (Signed)
Requested medication (s) are due for refill today: yes  Requested medication (s) are on the active medication list: yes  Last refill:  01/11/20 #90 0 refills   Future visit scheduled: yes  Notes to clinic:  not delegated per protocol     Requested Prescriptions  Pending Prescriptions Disp Refills   HYDROcodone-acetaminophen (NORCO/VICODIN) 5-325 MG tablet 90 tablet 0    Sig: Take 1 tablet by mouth every 6 (six) hours as needed for moderate pain. Fibromyalgia.      Not Delegated - Analgesics:  Opioid Agonist Combinations Failed - 02/10/2020 11:02 AM      Failed - This refill cannot be delegated      Failed - Urine Drug Screen completed in last 360 days.      Passed - Valid encounter within last 6 months    Recent Outpatient Visits           1 month ago Depression, major, single episode, in partial remission [facility])   [facility] Gle[facility], [facility]   1 month ago [place], MD   3 months ago Depression, major, single episode, in partial remission [facility])   [facility] Glean Hess, MD   1 year ago Acute non-recurrent maxillary sinusitis   [facility] Glean Hess, MD   1 year ago Acute recurrent frontal sinusitis   [facility] Glean Hess, MD       Future Appointments             In 2 months Army Melia Jesse Sans, MD [facility], [facility]

## 2020-02-10 NOTE — Telephone Encounter (Signed)
Please Advise. Last office visit 01/11/2020. ° °KP

## 2020-02-10 NOTE — Telephone Encounter (Signed)
Patient requesting HYDROcodone-acetaminophen (NORCO/VICODIN) 5-325 MG tablet, informed patient please allow 48 to 72 hour turn around time, patient states she will run out over the weekend.   [id number] [address], [place] Phone:  [phone]  Fax:  [phone]

## 2020-02-25 ENCOUNTER — Ambulatory Visit
Admission: RE | Admit: 2020-02-25 | Discharge: 2020-02-25 | Disposition: A | Discharge status: Home / Self Care | Payer: Medicare PPO | Source: Ambulatory Visit | Attending: Acute Care | Admitting: Acute Care

## 2020-02-25 DIAGNOSIS — I639 Cerebral infarction, unspecified: Secondary | ICD-10-CM | POA: Diagnosis not present

## 2020-02-25 DIAGNOSIS — R413 Other amnesia: Secondary | ICD-10-CM | POA: Diagnosis not present

## 2020-02-25 DIAGNOSIS — H748X1 Other specified disorders of right middle ear and mastoid: Secondary | ICD-10-CM | POA: Diagnosis not present

## 2020-02-25 DIAGNOSIS — R9082 White matter disease, unspecified: Secondary | ICD-10-CM | POA: Diagnosis not present

## 2020-03-09 ENCOUNTER — Other Ambulatory Visit: Payer: Self-pay | Admitting: Internal Medicine

## 2020-03-09 DIAGNOSIS — M797 Fibromyalgia: Secondary | ICD-10-CM

## 2020-03-09 NOTE — Telephone Encounter (Signed)
Pt request refill  HYDROcodone-acetaminophen (NORCO/VICODIN) 5-325 MG tablet  [id number] [address], [place] [facility] [place] Phone:  [phone]  Fax:  [phone]

## 2020-03-09 NOTE — Telephone Encounter (Signed)
Please Advise. Last office visit 01/11/2020.  KP

## 2020-03-09 NOTE — Telephone Encounter (Signed)
Requested medication (s) are due for refill today: yes  Requested medication (s) are on the active medication list: yes  Last refill: 02/10/2020  Future visit scheduled: yes  Notes to clinic:  this refill cannot be delegated    Requested Prescriptions  Pending Prescriptions Disp Refills   HYDROcodone-acetaminophen (NORCO/VICODIN) 5-325 MG tablet 90 tablet 0    Sig: Take 1 tablet by mouth every 6 (six) hours as needed for moderate pain. Fibromyalgia.      Not Delegated - Analgesics:  Opioid Agonist Combinations Failed - 03/09/2020  1:24 PM      Failed - This refill cannot be delegated      Failed - Urine Drug Screen completed in last 360 days.      Passed - Valid encounter within last 6 months    Recent Outpatient Visits           1 month ago Depression, major, single episode, in partial remission [facility])   [facility] Glean [facility], MD   2 months ago [place], MD   3 months ago Depression, major, single episode, in partial remission [facility])   [facility] Glean Hess, MD   1 year ago Acute non-recurrent maxillary sinusitis   [facility] Gle[facility], MD   1 year ago Acute recurrent frontal sinusitis   [facility] Glean [facility], MD       Future Appointments             In 1 month Army Melia Jesse Sans, MD [facility], [facility]

## 2020-03-10 ENCOUNTER — Other Ambulatory Visit: Payer: Self-pay | Admitting: Internal Medicine

## 2020-03-10 MED ORDER — HYDROCODONE-ACETAMINOPHEN 5-325 MG PO TABS: 1.0000 | ORAL_TABLET | Freq: Four times a day (QID) | ORAL | 0 refills | Status: DC | PRN

## 2020-03-14 ENCOUNTER — Other Ambulatory Visit: Payer: Self-pay | Admitting: Internal Medicine

## 2020-03-14 ENCOUNTER — Encounter: Payer: Self-pay | Admitting: Internal Medicine

## 2020-03-14 DIAGNOSIS — M797 Fibromyalgia: Secondary | ICD-10-CM

## 2020-03-14 MED ORDER — HYDROCODONE-ACETAMINOPHEN 5-325 MG PO TABS: 1.0000 | ORAL_TABLET | Freq: Four times a day (QID) | ORAL | 0 refills | Status: DC | PRN

## 2020-03-19 ENCOUNTER — Encounter: Payer: Self-pay | Admitting: Internal Medicine

## 2020-03-19 NOTE — Telephone Encounter (Signed)
Order Cologuard?  KP

## 2020-03-20 ENCOUNTER — Other Ambulatory Visit: Payer: Self-pay

## 2020-03-20 DIAGNOSIS — Z1211 Encounter for screening for malignant neoplasm of colon: Secondary | ICD-10-CM

## 2020-03-22 ENCOUNTER — Encounter: Payer: Self-pay | Admitting: Internal Medicine

## 2020-03-27 ENCOUNTER — Other Ambulatory Visit: Payer: Self-pay | Admitting: Internal Medicine

## 2020-03-27 NOTE — Telephone Encounter (Signed)
Requested medications are due for refill today?  Yes  Requested medications are on active medication list?  Yes  Last Refill:  11/26/2019  # 90 with one refill   Future visit scheduled?  Yes  Notes to Clinic:  Medication failed Rx refill protocol due to no labs within the past 180 days.  Last labs performed in [facility] ([facility]) was 12/06/2014.  This RN checked Care Everywhere for more recent labs.  Last Comprehensive Metabolic Panel in Care Everywhere was 12/31/2018 at [facility].

## 2020-04-02 DIAGNOSIS — Z1211 Encounter for screening for malignant neoplasm of colon: Secondary | ICD-10-CM | POA: Diagnosis not present

## 2020-04-02 LAB — COLOGUARD: Cologuard: POSITIVE — AB

## 2020-04-05 ENCOUNTER — Telehealth: Payer: Self-pay | Admitting: Internal Medicine

## 2020-04-05 LAB — COLOGUARD: COLOGUARD: POSITIVE — AB

## 2020-04-05 NOTE — Telephone Encounter (Signed)
Please let patient know that cologuard is positive.  Find out if there is a reason she does not want a colonoscopy?

## 2020-04-09 NOTE — Telephone Encounter (Signed)
The cologuard detects DNA shed from cancer and polyps so it is not positive due to hemorrhoidal bleeding.  I think she needs to try to do a colonoscopy and discuss with GI alternate preps that she can use.

## 2020-04-10 ENCOUNTER — Other Ambulatory Visit: Payer: Self-pay

## 2020-04-10 DIAGNOSIS — R195 Other fecal abnormalities: Secondary | ICD-10-CM

## 2020-04-10 NOTE — Telephone Encounter (Signed)
Patient informed. Placed referral for Dr Allen Norris in [facility] to consult with patient about colonoscopy.

## 2020-04-11 ENCOUNTER — Other Ambulatory Visit: Payer: Self-pay | Admitting: Internal Medicine

## 2020-04-11 DIAGNOSIS — M797 Fibromyalgia: Secondary | ICD-10-CM

## 2020-04-11 MED ORDER — HYDROCODONE-ACETAMINOPHEN 5-325 MG PO TABS: 1.0000 | ORAL_TABLET | Freq: Four times a day (QID) | ORAL | 0 refills | Status: DC | PRN

## 2020-04-11 NOTE — Telephone Encounter (Signed)
Requested medication (s) are due for refill today:yes  Requested medication (s) are on the active medication list: yes  Last refill: 03/14/20  #90  0 refills  Future visit scheduled yes   04/18/20  Notes to clinic:not delegated  Requested Prescriptions  Pending Prescriptions Disp Refills   HYDROcodone-acetaminophen (NORCO/VICODIN) 5-325 MG tablet 90 tablet 0    Sig: Take 1 tablet by mouth every 6 (six) hours as needed for moderate pain. Fibromyalgia.      Not Delegated - Analgesics:  Opioid Agonist Combinations Failed - 04/11/2020 11:24 AM      Failed - This refill cannot be delegated      Failed - Urine Drug Screen completed in last 360 days.      Passed - Valid encounter within last 6 months    Recent Outpatient Visits           3 months ago Depression, major, single episode, in partial remission [facility])   [facility] Gle[facility], MD   3 months ago [place], MD   5 months ago Depression, major, single episode, in partial remission [facility])   [facility] Gle[facility], [facility]   1 year ago Acute non-recurrent maxillary sinusitis   [facility] Glean Hess, MD   1 year ago Acute recurrent frontal sinusitis   [facility]       Future Appointments             In 1 week Army Melia Jesse Sans, MD [facility], [facility]

## 2020-04-11 NOTE — Telephone Encounter (Signed)
HYDROcodone-acetaminophen (NORCO/VICODIN) 5-325 MG tablet Medication [id number] [address] [place] Phone:  [phone]  Fax:  [phone]     Please refill per pt request     Send to pharmacy

## 2020-04-12 DIAGNOSIS — R413 Other amnesia: Secondary | ICD-10-CM | POA: Diagnosis not present

## 2020-04-13 ENCOUNTER — Other Ambulatory Visit: Payer: Self-pay

## 2020-04-16 ENCOUNTER — Encounter: Payer: Self-pay | Admitting: Internal Medicine

## 2020-04-18 ENCOUNTER — Ambulatory Visit: Payer: Medicare PPO

## 2020-04-18 ENCOUNTER — Ambulatory Visit: Payer: Medicare PPO | Admitting: Internal Medicine

## 2020-04-19 DIAGNOSIS — Z170 Estrogen receptor positive status [ER+]: Secondary | ICD-10-CM | POA: Diagnosis not present

## 2020-04-19 DIAGNOSIS — C50412 Malignant neoplasm of upper-outer quadrant of left female breast: Secondary | ICD-10-CM | POA: Diagnosis not present

## 2020-04-19 LAB — HEPATIC FUNCTION PANEL
ALT: 89 — AB (ref 7–35)
AST: 29 (ref 13–35)
Bilirubin, Total: 0.3

## 2020-04-19 LAB — CBC AND DIFFERENTIAL
HCT: 39 (ref 36–46)
Hemoglobin: 12.7 (ref 12.0–16.0)
Platelets: 213 (ref 150–399)
WBC: 8.7

## 2020-04-19 LAB — BASIC METABOLIC PANEL
BUN: 14 (ref 4–21)
CO2: 6 — AB (ref 13–22)
Chloride: 103 (ref 99–108)
Creatinine: 1 (ref 0.5–1.1)
Potassium: 3.8 (ref 3.4–5.3)
Sodium: 141 (ref 137–147)

## 2020-04-19 LAB — COMPREHENSIVE METABOLIC PANEL WITH GFR: GFR calc non Af Amer: 56

## 2020-04-21 ENCOUNTER — Other Ambulatory Visit: Payer: Self-pay | Admitting: Internal Medicine

## 2020-04-21 DIAGNOSIS — G2581 Restless legs syndrome: Secondary | ICD-10-CM

## 2020-04-21 NOTE — Telephone Encounter (Signed)
Requested Prescriptions  Pending Prescriptions Disp Refills  . propranolol ER (INDERAL LA) 60 MG 24 hr capsule [Pharmacy Med Name: PROPRANOLOL HYDROCHLORIDEER 60 MG Capsule Extended Release 24 Hour] 90 capsule     Sig: TAKE 1 CAPSULE EVERY DAY     Cardiovascular:  Beta Blockers Passed - 04/21/2020  3:16 PM      Passed - Last BP in normal range    BP Readings from Last 1 Encounters:  12/14/19 124/82         Passed - Last Heart Rate in normal range    Pulse Readings from Last 1 Encounters:  01/11/20 64         Passed - Valid encounter within last 6 months    Recent Outpatient Visits          3 months ago Depression, major, single episode, in partial remission [facility])   [facility]   4 months ago [facility], MD   5 months ago Depression, major, single episode, in partial remission [facility])   [facility] Glean Hess, MD   1 year ago Acute non-recurrent maxillary sinusitis   [facility], [facility]   1 year ago Acute recurrent frontal sinusitis   [facility] Gle[facility], [facility]             . busPIRone (BUSPAR) 10 MG tablet [Pharmacy Med Name: BUSPIRONE HYDROCHLORIDE 10 MG Tablet] 360 tablet 0    Sig: TAKE 1 TABLET FOUR TIMES DAILY     Psychiatry: Anxiolytics/Hypnotics - Non-controlled Passed - 04/21/2020  3:16 PM      Passed - Valid encounter within last 6 months    Recent Outpatient Visits          3 months ago Depression, major, single episode, in partial remission [facility])   [facility] Glean Hess, MD   4 months ago [facility] Glean Hess, MD   5 months ago Depression, major, single episode, in partial remission [facility])   [facility] Glean Hess, MD   1 year ago Acute non-recurrent maxillary sinusitis   [facility] Glean Hess, MD   1 year ago Acute recurrent frontal  sinusitis   [facility] Glean Hess, MD             . rOPINIRole (REQUIP) 0.5 MG tablet [Pharmacy Med Name: ROPINIROLE HYDROCHLORIDE 0.5 MG Tablet] 630 tablet     Sig: TAKE 7 TABLETS AT BEDTIME     Neurology:  Parkinsonian Agents Passed - 04/21/2020  3:16 PM      Passed - Last BP in normal range    BP Readings from Last 1 Encounters:  12/14/19 124/82         Passed - Valid encounter within last 12 months    Recent Outpatient Visits          3 months ago Depression, major, single episode, in partial remission [facility])   [facility] Glean Hess, MD   4 months ago [facility] Gle[facility], MD   5 months ago Depression, major, single episode, in partial remission [facility])   [facility] Glean Hess, MD   1 year ago Acute non-recurrent maxillary sinusitis   [facility], MD   1 year ago Acute recurrent frontal sinusitis  [facility] Glean Hess, MD

## 2020-04-26 ENCOUNTER — Encounter: Payer: Self-pay | Admitting: Internal Medicine

## 2020-05-10 ENCOUNTER — Telehealth: Payer: Self-pay | Admitting: Internal Medicine

## 2020-05-10 NOTE — Telephone Encounter (Signed)
Medication Refill - Medication: HYDROcodone-acetaminophen (NORCO/VICODIN) 5-325 MG tablet   Has the patient contacted their pharmacy? No. (Agent: If no, request that the patient contact the pharmacy for the refill.) (Agent: If yes, when and what did the pharmacy advise?)  Preferred Pharmacy (with phone number or street name):  [id number] [place] [place] - [address]  [place], [place] [address]  Phone:  [phone] Fax:  [phone]   Agent: Please be advised that RX refills may take up to 3 business days. We ask that you follow-up with your pharmacy.

## 2020-05-11 NOTE — Telephone Encounter (Signed)
Please Advise. Last office visit 03/20/2020. Called pt left VM that Dr. Army Melia was out of office that medication may be refilled on Monday, 05/14/2020.  KP

## 2020-05-12 ENCOUNTER — Other Ambulatory Visit: Payer: Self-pay | Admitting: Internal Medicine

## 2020-05-12 DIAGNOSIS — M797 Fibromyalgia: Secondary | ICD-10-CM

## 2020-05-12 MED ORDER — HYDROCODONE-ACETAMINOPHEN 5-325 MG PO TABS: 1.0000 | ORAL_TABLET | Freq: Four times a day (QID) | ORAL | 0 refills | Status: DC | PRN

## 2020-05-16 ENCOUNTER — Other Ambulatory Visit: Payer: Self-pay

## 2020-05-16 ENCOUNTER — Encounter: Payer: Self-pay | Admitting: Gastroenterology

## 2020-05-16 ENCOUNTER — Encounter: Payer: Self-pay | Admitting: Anesthesiology

## 2020-05-16 ENCOUNTER — Ambulatory Visit (INDEPENDENT_AMBULATORY_CARE_PROVIDER_SITE_OTHER): Payer: Medicare PPO | Admitting: Gastroenterology

## 2020-05-16 VITALS — BP 176/95 | HR 65 | Ht 62.0 in | Wt 167.4 lb

## 2020-05-16 DIAGNOSIS — R195 Other fecal abnormalities: Secondary | ICD-10-CM | POA: Diagnosis not present

## 2020-05-16 MED ORDER — SUTAB 1479-225-188 MG PO TABS: 376.0000 mg | ORAL_TABLET | ORAL | 0 refills | Status: DC

## 2020-05-16 NOTE — Progress Notes (Signed)
Gastroenterology Consultation  Referring Provider:     Glean Hess, MD Primary Care Physician:  Sandra Hess, MD Primary Gastroenterologist:  Dr. Allen Norris     Reason for Consultation:     Positive Cologuard        HPI:   Sandra Mckinney is a 74 y.o. y/o female referred for consultation & management of positive Cologuard by Sandra Mckinney, Sandra Sans, MD.  This patient comes in today after having a Cologuard test that was positive.  The patient had reported that she was very anxious about undergoing a colonoscopy and wanted to come in to discuss the implications of her Cologuard and proceeding with a colonoscopy.  The patient has a history of breast cancer and has been treated for that past. The patient has never had a colonoscopy comes in today to discuss the positive Cologuard and her anxiety about having any procedures done.  The patient states that during her breast surgery She reports that her heart stopped.  Past Medical History:  Diagnosis Date  . Breast cancer ([place]) 2016  . Fibromyalgia   . Hyperlipidemia   . Major depressive disorder   . Restless leg syndrome   . Vitamin D deficiency     Past Surgical History:  Procedure Laterality Date  . ABDOMINAL HYSTERECTOMY  1986   uterine fibroids and endometriosis  . BREAST BIOPSY Right 06/01/12   [place] bx/clip-neg  . BREAST IMPLANT REMOVAL Bilateral 10/19/2019  . BREAST RECONSTRUCTION Bilateral 2017  . BREAST REDUCTION SURGERY  10/19/2019   all breast reconstructions removed  . CARPAL TUNNEL RELEASE Right 2014  . EYE SURGERY Bilateral 2009   catarcts  . MASTECTOMY, RADICAL Bilateral 2016  . REDUCTION MAMMAPLASTY     2001  . THUMB ARTHROSCOPY Right 2014    Prior to Admission medications   Medication Sig Start Date End Date Taking? Authorizing Provider  aspirin-acetaminophen-caffeine (EXCEDRIN MIGRAINE) [phone] MG tablet Take by mouth every 6 (six) hours as needed for headache.    [provider]    busPIRone (BUSPAR) 10 MG tablet TAKE 1 TABLET FOUR TIMES DAILY 04/21/20   Sandra Hess, MD  Calcium-Magnesium-Vitamin D 600-40-500 MG-MG-UNIT TB24 Take 1 tablet by mouth daily.    [provider]  chlorpheniramine (CHLOR-TRIMETON) 4 MG tablet Take 4 mg by mouth daily.    [provider]  cholecalciferol (VITAMIN D) 1000 UNITS tablet Take 1 tablet by mouth daily.    [provider]  citalopram (CELEXA) 10 MG tablet Take 10 mg by mouth daily. 04/12/20   [provider]  donepezil (ARICEPT) 5 MG tablet  02/08/20   [provider]  ferrous sulfate 325 (65 FE) MG tablet Take 325 mg by mouth daily with breakfast.    [provider]  furosemide (LASIX) 20 MG tablet TAKE 1 TABLET EVERY DAY 03/27/20   Sandra Hess, MD  gabapentin (NEURONTIN) 100 MG capsule TAKE 1 CAPSULE THREE TIMES DAILY Patient taking differently: Nerve Pain, Fibromyalgia 02/13/16   Sandra Hess, MD  HYDROcodone-acetaminophen (NORCO/VICODIN) 5-325 MG tablet Take 1 tablet by mouth every 6 (six) hours as needed for moderate pain. Fibromyalgia. 05/12/20 06/11/20  Sandra Hess, MD  imiquimod [place]) 5 % cream Apply topically 3 (three) times a week. Dr Phillip Heal.    [provider]  loperamide (IMODIUM A-D) 2 MG tablet Take 2 mg by mouth 4 (four) times daily as needed for diarrhea or loose stools.    [provider]  magnesium oxide (MAG-OX) 400 MG tablet Take 400 mg by mouth 2 (two) times daily.    [provider]  MULTIPLE VITAMINS-MINERALS PO Take 1 tablet by mouth daily.    [provider]  propranolol ER (INDERAL LA) 60 MG 24 hr capsule TAKE 1 CAPSULE EVERY DAY 01/16/20   Sandra Hess, MD  rOPINIRole (REQUIP) 0.5 MG tablet TAKE 7 TABLETS AT BEDTIME 01/16/20   Sandra Hess, MD    Family History  Problem Relation Age of Onset  . Dementia Mother   . Hypertension Mother   . Heart disease Father 30     Social History    Tobacco Use  . Smoking status: Former Smoker    Packs/day: 0.75    Years: 10.00    Pack years: 7.50    Types: Cigarettes    Quit date: 04/13/1974    Years since quitting: 46.1  . Smokeless tobacco: Never Used  . Tobacco comment: smoking cessation materials not required  Vaping Use  . Vaping Use: Never used  Substance Use Topics  . Alcohol use: No    Alcohol/week: 0.0 standard drinks  . Drug use: No    Allergies as of 05/16/2020 - Review Complete 01/11/2020  Allergen Reaction Noted  . Cephalexin Hives 10/10/2015  . Letrozole Swelling 04/06/2015  . Raloxifene Hives 10/10/2015  . Selective estrogen receptor modulators Hives 06/11/2015  . Cephalosporins  10/11/2014  . Duloxetine hcl  10/11/2014  . Exemestane Other (See Comments) 02/08/2020  . Lyrica [pregabalin]  10/11/2014  . Oxycodone  10/11/2014  . Penicillins  10/11/2014  . Prilosec  [omeprazole]  10/11/2014  . Sulfa antibiotics  10/11/2014    Review of Systems:    All systems reviewed and negative except where noted in HPI.   Physical Exam:  There were no vitals taken for this visit. No LMP recorded. Patient has had a hysterectomy. General:   Alert,  Well-developed, well-nourished, pleasant and cooperative in NAD Head:  Normocephalic and atraumatic. Eyes:  Sclera clear, no icterus.   Conjunctiva pink. Ears:  Normal auditory acuity. Neck:  Supple; no masses or thyromegaly. Lungs:  Respirations even and unlabored.  Clear throughout to auscultation.   No wheezes, crackles, or rhonchi. No acute distress. Heart:  Regular rate and rhythm; no murmurs, clicks, rubs, or gallops. Abdomen:  Normal bowel sounds.  No bruits.  Soft, non-tender and non-distended without masses, hepatosplenomegaly or hernias noted.  No guarding or rebound tenderness.  Negative Carnett sign.   Rectal:  Deferred.  Pulses:  Normal pulses noted. Extremities:  No clubbing or edema.  No cyanosis. Neurologic:  Alert and oriented x3;  grossly normal  neurologically. Skin:  Intact without significant lesions or rashes.  No jaundice. Lymph Nodes:  No significant cervical adenopathy. Psych:  Alert and cooperative. Normal mood and affect.  Imaging Studies: No results found.  Assessment and Plan:   Sandra Mckinney is a 74 y.o. y/o female who comes in today with a positive Cologuard. The patient has not seen any blood nor has she had any other symptoms.  The patient will be set up for colonoscopy and has been explained the procedure including the risks and benefits.  The patient agrees to proceeding with the procedure and will be set up for a colonoscopy.    Lucilla Lame, MD. Marval Regal    Note: This dictation was prepared with Dragon dictation along with smaller phrase technology. Any transcriptional errors that result from this process are unintentional.

## 2020-05-17 ENCOUNTER — Other Ambulatory Visit
Admission: RE | Admit: 2020-05-17 | Discharge: 2020-05-17 | Disposition: A | Discharge status: Home / Self Care | Payer: Medicare PPO | Source: Ambulatory Visit | Attending: Gastroenterology | Admitting: Gastroenterology

## 2020-05-17 DIAGNOSIS — Z20822 Contact with and (suspected) exposure to covid-19: Secondary | ICD-10-CM | POA: Diagnosis not present

## 2020-05-17 DIAGNOSIS — Z01812 Encounter for preprocedural laboratory examination: Secondary | ICD-10-CM | POA: Insufficient documentation

## 2020-05-17 LAB — SARS CORONAVIRUS 2 (TAT 6-24 HRS): SARS Coronavirus 2: NEGATIVE

## 2020-05-18 NOTE — Discharge Instructions (Signed)
General Anesthesia, Adult, Care After This sheet gives you information about how to care for yourself after your procedure. Your health care provider may also give you more specific instructions. If you have problems or questions, contact your health care provider. What can I expect after the procedure? After the procedure, the following side effects are common:  Pain or discomfort at the IV site.  Nausea.  Vomiting.  Sore throat.  Trouble concentrating.  Feeling cold or chills.  Weak or tired.  Sleepiness and fatigue.  Soreness and body aches. These side effects can affect parts of the body that were not involved in surgery. Follow these instructions at home:  For at least 24 hours after the procedure:  Have a responsible adult stay with you. It is important to have someone help care for you until you are awake and alert.  Rest as needed.  Do not: ? Participate in activities in which you could fall or become injured. ? Drive. ? Use heavy machinery. ? Drink alcohol. ? Take sleeping pills or medicines that cause drowsiness. ? Make important decisions or sign legal documents. ? Take care of children on your own. Eating and drinking  Follow any instructions from your health care provider about eating or drinking restrictions.  When you feel hungry, start by eating small amounts of foods that are soft and easy to digest (bland), such as toast. Gradually return to your regular diet.  Drink enough fluid to keep your urine pale yellow.  If you vomit, rehydrate by drinking water, juice, or clear broth. General instructions  If you have sleep apnea, surgery and certain medicines can increase your risk for breathing problems. Follow instructions from your health care provider about wearing your sleep device: ? Anytime you are sleeping, including during daytime naps. ? While taking prescription pain medicines, sleeping medicines, or medicines that make you drowsy.  Return to  your normal activities as told by your health care provider. Ask your health care provider what activities are safe for you.  Take over-the-counter and prescription medicines only as told by your health care provider.  If you smoke, do not smoke without supervision.  Keep all follow-up visits as told by your health care provider. This is important. Contact a health care provider if:  You have nausea or vomiting that does not get better with medicine.  You cannot eat or drink without vomiting.  You have pain that does not get better with medicine.  You are unable to pass urine.  You develop a skin rash.  You have a fever.  You have redness around your IV site that gets worse. Get help right away if:  You have difficulty breathing.  You have chest pain.  You have blood in your urine or stool, or you vomit blood. Summary  After the procedure, it is common to have a sore throat or nausea. It is also common to feel tired.  Have a responsible adult stay with you for the first 24 hours after general anesthesia. It is important to have someone help care for you until you are awake and alert.  When you feel hungry, start by eating small amounts of foods that are soft and easy to digest (bland), such as toast. Gradually return to your regular diet.  Drink enough fluid to keep your urine pale yellow.  Return to your normal activities as told by your health care provider. Ask your health care provider what activities are safe for you. This information is not   intended to replace advice given to you by your health care provider. Make sure you discuss any questions you have with your health care provider. Document Revised: 06/26/2017 Document Reviewed: 02/06/2017 Elsevier Patient Education  2020 [employer]  

## 2020-05-21 ENCOUNTER — Encounter: Payer: Self-pay | Admitting: Internal Medicine

## 2020-05-21 ENCOUNTER — Ambulatory Visit: Admission: RE | Admit: 2020-05-21 | Payer: Medicare PPO | Source: Home / Self Care | Admitting: Gastroenterology

## 2020-05-21 HISTORY — DX: Other complications of anesthesia, initial encounter: T88.59XA

## 2020-05-21 HISTORY — DX: Migraine, unspecified, not intractable, without status migrainosus: G43.909

## 2020-05-21 SURGERY — COLONOSCOPY WITH PROPOFOL
Anesthesia: Choice

## 2020-05-21 NOTE — Telephone Encounter (Signed)
Sending for [place] appointment has been canceled

## 2020-05-28 ENCOUNTER — Encounter: Payer: Self-pay | Admitting: Internal Medicine

## 2020-05-29 ENCOUNTER — Encounter: Payer: Self-pay | Admitting: Internal Medicine

## 2020-05-30 NOTE — Telephone Encounter (Signed)
Please review.  KP

## 2020-06-07 ENCOUNTER — Ambulatory Visit: Payer: Medicare PPO | Admitting: Gastroenterology

## 2020-06-11 ENCOUNTER — Other Ambulatory Visit: Payer: Self-pay | Admitting: Internal Medicine

## 2020-06-11 DIAGNOSIS — M797 Fibromyalgia: Secondary | ICD-10-CM

## 2020-06-11 MED ORDER — HYDROCODONE-ACETAMINOPHEN 5-325 MG PO TABS: 1.0000 | ORAL_TABLET | Freq: Four times a day (QID) | ORAL | 0 refills | Status: DC | PRN

## 2020-06-11 NOTE — Telephone Encounter (Signed)
Medication Refill - Medication: hydrocodone  Has the patient contacted their pharmacy? No. (Agent: If no, request that the patient contact the pharmacy for the refill.) (Agent: If yes, when and what did the pharmacy advise?)  Preferred Pharmacy (with phone number or street name):[id number] - [place], [place]: Please be advised that RX refills may take up to 3 business days. We ask that you follow-up with your pharmacy.

## 2020-06-11 NOTE — Telephone Encounter (Signed)
Please Advise. Last office visit 03/20/20.  KP

## 2020-06-11 NOTE — Telephone Encounter (Signed)
Requested medication (s) are due for refill today:  yes  Requested medication (s) are on the active medication list: yes  Last refill:  05/12/2020  Future visit scheduled: yes  Notes to clinic:  this refill cannot be delegated    Requested Prescriptions  Pending Prescriptions Disp Refills   HYDROcodone-acetaminophen (NORCO/VICODIN) 5-325 MG tablet 90 tablet 0    Sig: Take 1 tablet by mouth every 6 (six) hours as needed for moderate pain. Fibromyalgia.      Not Delegated - Analgesics:  Opioid Agonist Combinations Failed - 06/11/2020 11:31 AM      Failed - This refill cannot be delegated      Failed - Urine Drug Screen completed in last 360 days      Passed - Valid encounter within last 6 months    Recent Outpatient Visits           5 months ago Depression, major, single episode, in partial remission [facility])   [facility] Glean Hess, MD   6 months ago [place], [place]   7 months ago Depression, major, single episode, in partial remission [facility])   [facility] Glean Hess, MD   1 year ago Acute non-recurrent maxillary sinusitis   [facility] Glean Hess, MD   1 year ago Acute recurrent frontal sinusitis   [facility] Glean Hess, MD       Future Appointments             In 4 weeks [facility] Melia Jesse Sans, MD [facility], [facility]

## 2020-06-14 ENCOUNTER — Other Ambulatory Visit: Payer: Self-pay | Admitting: Internal Medicine

## 2020-06-15 ENCOUNTER — Encounter: Payer: Self-pay | Admitting: Internal Medicine

## 2020-06-15 ENCOUNTER — Other Ambulatory Visit: Payer: Self-pay | Admitting: Internal Medicine

## 2020-06-15 DIAGNOSIS — G2581 Restless legs syndrome: Secondary | ICD-10-CM

## 2020-06-15 MED ORDER — PROPRANOLOL HCL ER 60 MG PO CP24
60.0000 mg | ORAL_CAPSULE | Freq: Every day | ORAL | 1 refills | Status: DC
Start: 2020-06-15 — End: 2020-10-22

## 2020-06-15 MED ORDER — ROPINIROLE HCL 0.5 MG PO TABS: ORAL_TABLET | ORAL | 1 refills | Status: DC

## 2020-06-25 ENCOUNTER — Ambulatory Visit (INDEPENDENT_AMBULATORY_CARE_PROVIDER_SITE_OTHER): Payer: Medicare PPO

## 2020-06-25 DIAGNOSIS — Z0000 Encounter for general adult medical examination without abnormal findings: Secondary | ICD-10-CM | POA: Diagnosis not present

## 2020-06-25 NOTE — Progress Notes (Signed)
Subjective:   Sandra Mckinney is a 74 y.o. female who presents for Medicare Annual (Subsequent) preventive examination.  Virtual Visit via Telephone Note  I connected with  Sandra Mckinney on 06/25/20 at  8:40 AM EST by telephone and verified that I am speaking with the correct person using two identifiers.  Location: Patient: home Provider: [facility] Persons participating in the virtual visit: [place]   I discussed the limitations, risks, security and privacy concerns of performing an evaluation and management service by telephone and the availability of in person appointments. The patient expressed understanding and agreed to proceed.  Interactive audio and video telecommunications were attempted between this nurse and patient, however failed, due to patient having technical difficulties OR patient did not have access to video capability.  We continued and completed visit with audio only.  Some vital signs may be absent or patient reported.   [name], LPN    Review of Systems     Cardiac Risk Factors include: advanced age (>30men, >58 women)     Objective:    Today's Vitals   06/25/20 0848  PainSc: 7    There is no height or weight on file to calculate BMI.  Advanced Directives 06/25/2020 04/18/2019 04/14/2018 04/13/2017 04/16/2016 10/10/2015  Does Patient Have a Medical Advance Directive? Yes Yes Yes Yes No;Yes Yes  Type of Paramedic of [place];Living will Living will;Healthcare Power of [place];Living will [place];Living will - Living will  Copy of [place] in Chart? No - copy requested No - copy requested No - copy requested No - copy requested - -    Current Medications (verified) Outpatient Encounter Medications as of 06/25/2020  Medication Sig  . aspirin-acetaminophen-caffeine (EXCEDRIN MIGRAINE) 250-250-65 MG tablet Take by  mouth every 6 (six) hours as needed for headache.  . busPIRone (BUSPAR) 10 MG tablet TAKE 1 TABLET FOUR TIMES DAILY  . Calcium-Magnesium-Vitamin D 600-40-500 MG-MG-UNIT TB24 Take 1 tablet by mouth daily.  . chlorpheniramine (CHLOR-TRIMETON) 4 MG tablet Take 4 mg by mouth daily.  . cholecalciferol (VITAMIN D) 1000 UNITS tablet Take 1 tablet by mouth daily.  Marland Kitchen donepezil (ARICEPT) 5 MG tablet   . ferrous sulfate 325 (65 FE) MG tablet Take 325 mg by mouth daily with breakfast.  . furosemide (LASIX) 20 MG tablet TAKE 1 TABLET EVERY DAY  . gabapentin (NEURONTIN) 100 MG capsule TAKE 1 CAPSULE THREE TIMES DAILY (Patient taking differently: Nerve Pain, Fibromyalgia)  . GLUCOSAMINE-CHONDROITIN PO Take by mouth daily.  Marland Kitchen HYDROcodone-acetaminophen (NORCO/VICODIN) 5-325 MG tablet Take 1 tablet by mouth every 6 (six) hours as needed for moderate pain. Fibromyalgia.  Marland Kitchen loperamide (IMODIUM A-D) 2 MG tablet Take 2 mg by mouth 4 (four) times daily as needed for diarrhea or loose stools.  . magnesium oxide (MAG-OX) 400 MG tablet Take 400 mg by mouth 2 (two) times daily.  . MULTIPLE VITAMINS-MINERALS PO Take 1 tablet by mouth daily.  . propranolol ER (INDERAL LA) 60 MG 24 hr capsule Take 1 capsule (60 mg total) by mouth daily.  Marland Kitchen rOPINIRole (REQUIP) 0.5 MG tablet TAKE 7 TABLETS AT BEDTIME  . [DISCONTINUED] citalopram (CELEXA) 10 MG tablet Take 10 mg by mouth daily. (Patient not taking: Reported on 05/16/2020)  . [DISCONTINUED] imiquimod (ALDARA) 5 % cream Apply topically 3 (three) times a week. Dr Phillip Heal.  . [DISCONTINUED] Sodium Sulfate-Mag Sulfate-KCl (SUTAB) [phone] MG TABS Take 376 mg by mouth as directed.  No facility-administered encounter medications on file as of 06/25/2020.    Allergies (verified) Cephalexin, Letrozole, Raloxifene, Selective estrogen receptor modulators, Citalopram, Cephalosporins, Duloxetine hcl, Exemestane, Lyrica [pregabalin], Oxycodone, Penicillins, Prilosec  [omeprazole], and  Sulfa antibiotics   History: Past Medical History:  Diagnosis Date  . Breast cancer ([place]) 2016  . Complication of anesthesia    2007.  Med injected into vein instead of muscle.  Heart stopped.  3 "shocks" to restart.  3 grand mal seizures after heart restarted.  . Fibromyalgia   . Hyperlipidemia   . Major depressive disorder   . Migraine headache    only 3-4 per year since starting propranolol  . Restless leg syndrome   . Seizures ([place]) 2007   Med was injected into vein instead of muscle.  Heart stopped.  3 shocks to restart heart.  3 grand mal seizures after heart restarted.  . Vitamin D deficiency    Past Surgical History:  Procedure Laterality Date  . ABDOMINAL HYSTERECTOMY  1986   uterine fibroids and endometriosis  . BREAST BIOPSY Right 06/01/12   [place] bx/clip-neg  . BREAST IMPLANT REMOVAL Bilateral 10/19/2019  . BREAST RECONSTRUCTION Bilateral 2017  . BREAST REDUCTION SURGERY  10/19/2019   all breast reconstructions removed  . CARPAL TUNNEL RELEASE Right 2014  . EYE SURGERY Bilateral 2009   catarcts  . MASTECTOMY, RADICAL Bilateral 2016  . REDUCTION MAMMAPLASTY     2001  . THUMB ARTHROSCOPY Right 2014   Family History  Problem Relation Age of Onset  . Dementia Mother   . Hypertension Mother   . Heart disease Father 81   Social History   Socioeconomic History  . Marital status: Married    Spouse name: Not on file  . Number of children: 2  . Years of education: some college  . Highest education level: 12th grade  Occupational History  . Occupation: Retired  Tobacco Use  . Smoking status: Former Smoker    Packs/day: 0.75    Years: 10.00    Pack years: 7.50    Types: Cigarettes    Quit date: 04/13/1974    Years since quitting: 46.2  . Smokeless tobacco: Never Used  . Tobacco comment: smoking cessation materials not required  Vaping Use  . Vaping Use: Never used  Substance and Sexual Activity  . Alcohol use: No    Alcohol/week: 0.0 standard drinks  .  Drug use: No  . Sexual activity: Not Currently  Other Topics Concern  . Not on file  Social History Narrative  . Not on file   Social Determinants of Health   Financial Resource Strain: Low Risk   . Difficulty of Paying Living Expenses: Not hard at all  Food Insecurity: No Food Insecurity  . Worried About Charity fundraiser in the Last Year: Never true  . Ran Out of Food in the Last Year: Never true  Transportation Needs: No Transportation Needs  . Lack of Transportation (Medical): No  . Lack of Transportation (Non-Medical): No  Physical Activity: Inactive  . Days of Exercise per Week: 0 days  . Minutes of Exercise per Session: 0 min  Stress: No Stress Concern Present  . Feeling of Stress : Only a little  Social Connections: Moderately Isolated  . Frequency of Communication with Friends and Family: More than three times a week  . Frequency of Social Gatherings with Friends and Family: Once a week  . Attends Religious Services: Never  . Active Member of Clubs or Organizations:  No  . Attends Club or Organization Meetings: Never  . Marital Status: Married    Tobacco Counseling Counseling given: Not Answered Comment: smoking cessation materials not required   Clinical Intake:  Pre-visit preparation completed: Yes  Pain : 0-10 Pain Score: 7  Pain Type: Chronic pain (fibromylagia) Pain Location: Back Pain Orientation: Upper,Mid,Lower Pain Descriptors / Indicators: Burning,Aching     Nutritional Risks: None Diabetes: No  How often do you need to have someone help you when you read instructions, pamphlets, or other written materials from your doctor or pharmacy?: 1 - Never    Interpreter Needed?: No  Information entered by :: [name] LPN   Activities of Daily Living In your present state of health, do you have any difficulty performing the following activities: 06/25/2020  Hearing? N  Comment declines hearing aids  Vision? N  Difficulty concentrating or  making decisions? Y  Walking or climbing stairs? N  Dressing or bathing? N  Doing errands, shopping? N  Preparing Food and eating ? N  Using the Toilet? N  In the past six months, have you accidently leaked urine? N  Do you have problems with loss of bowel control? N  Managing your Medications? N  Managing your Finances? N  Housekeeping or managing your Housekeeping? N  Some recent data might be hidden    Patient Care Team: Glean Hess, MD as PCP - General (Family Medicine) Su Grand, MD as Consulting Physician (Oncology) Marcial Pacas, MD as Consulting Physician (Plastic Surgery)  Indicate any recent Medical Services you may have received from other than [facility] providers in the past year (date may be approximate).     Assessment:   This is a routine wellness examination for [facility].  Hearing/Vision screen  Hearing Screening   125Hz  250Hz  500Hz  1000Hz  2000Hz  3000Hz  4000Hz  6000Hz  8000Hz   Right ear:           Left ear:           Comments: Pt denies hearing difficulty  Vision Screening Comments: Annual vision screenings at [place] issues and exercise activities discussed: Current Exercise Habits: The patient does not participate in regular exercise at present, Exercise limited by: neurologic condition(s)  Goals    . DIET - EAT MORE FRUITS AND VEGETABLES     Recommend to eat 3 small healthy meals and at least 2 healthy snacks per day.    . Happiness     Continue to be positive and keep family and herself happy while keeping grand kids and staying active.     . Increase water intake     Recommend to cut out sodas and replace with a minimum of 6-8 glasses per day.      Depression Screen PHQ 2/9 Scores 06/25/2020 01/11/2020 12/14/2019 11/11/2019 04/18/2019 12/13/2018 04/14/2018  PHQ - 2 Score 0 0 0 0 0 0 0  PHQ- 9 Score - 1 0 0 - - 0    Fall Risk Fall Risk  06/25/2020 01/11/2020 12/14/2019 11/11/2019 04/18/2019  Falls in the past year? 0 0 0 0 0  Number  falls in past yr: 0 - 0 0 0  Injury with Fall? 0 - 0 0 0  Risk for fall due to : No Fall Risks - No Fall Risks No Fall Risks -  Follow up Falls prevention discussed Falls evaluation completed Falls evaluation completed Falls evaluation completed Falls prevention discussed    FALL RISK PREVENTION PERTAINING TO THE HOME:  Any stairs in  or around the home? Yes  If so, are there any without handrails? Yes - 3 steps outside Home free of loose throw rugs in walkways, pet beds, electrical cords, etc? Yes  Adequate lighting in your home to reduce risk of falls? Yes   ASSISTIVE DEVICES UTILIZED TO PREVENT FALLS:  Life alert? No  Use of a cane, walker or w/c? No  Grab bars in the bathroom? No  Shower chair or bench in shower? Yes  Elevated toilet seat or a handicapped toilet? Yes   TIMED UP AND GO:  Was the test performed? No . Telephonic visit.   Cognitive Function: Cognitive status assessed by direct observation. Patient has current diagnosis of cognitive impairment. Patient is followed by neurology for ongoing assessment.      6CIT Screen 01/11/2020 04/18/2019 04/14/2018 04/16/2016  What Year? 0 points 0 points 0 points 0 points  What month? 0 points 0 points 0 points 0 points  What time? 0 points 0 points 0 points 0 points  Count back from 20 0 points 0 points 0 points 0 points  Months in reverse 0 points 0 points 0 points 0 points  Repeat phrase 0 points 0 points 0 points 0 points  Total Score 0 0 0 0    Immunizations Immunization History  Administered Date(s) Administered  . Influenza, High Dose Seasonal PF 03/05/2017, 03/11/2018, 04/11/2019, 03/18/2020  . Influenza-Unspecified 03/07/2014, 02/03/2017, 04/11/2019  . PFIZER SARS-COV-2 Vaccination 08/19/2019, 09/09/2019, 04/05/2020  . Pneumococcal Conjugate-13 03/14/2014, 09/17/2015  . Pneumococcal Polysaccharide-23 04/18/2002, 11/07/2010, 04/14/2018  . Tdap 11/24/2011  . Zoster 07/07/2008, 10/09/2014    TDAP status: Up to  date  Flu Vaccine status: Up to date  Pneumococcal vaccine status: Up to date  Covid-19 vaccine status: Completed vaccines  Qualifies for Shingles Vaccine? Yes   Zostavax completed Yes   Shingrix Completed?: No.    Education has been provided regarding the importance of this vaccine. Patient has been advised to call insurance company to determine out of pocket expense if they have not yet received this vaccine. Advised may also receive vaccine at local pharmacy or Health Dept. Verbalized acceptance and understanding.  Screening Tests Health Maintenance  Topic Date Due  . COVID-19 Vaccine (4 - Booster for Pfizer series) 10/03/2020  . TETANUS/TDAP  11/23/2021  . Fecal DNA (Cologuard)  04/03/2023  . INFLUENZA VACCINE  Completed  . DEXA SCAN  Completed  . PNA vac Low Risk Adult  Completed  . Hepatitis C Screening  Addressed    Health Maintenance  There are no preventive care reminders to display for this patient.  Colorectal cancer screening: Type of screening: Cologuard. Completed 04/02/20. Repeat every 3 years. Pt had positive Cologuard and unable to complete colonoscopy due to prep making her very sick.    Mammogram status: No longer required due to bilateral mastectomy. .  Bone Density status: Completed 07/17/18. Results reflect: Bone density results: OSTEOPENIA. Repeat every 2 years. or as directed by PCP.   Lung Cancer Screening: (Low Dose CT Chest recommended if Age 2-80 years, 30 pack-year currently smoking OR have quit w/in 15years.) does not qualify.   Additional Screening:  Hepatitis C Screening: does qualify; Completed 01/09/15  Vision Screening: Recommended annual ophthalmology exams for early detection of glaucoma and other disorders of the eye. Is the patient up to date with their annual eye exam?  Yes  Who is the provider or what is the name of the office in which the patient attends annual eye exams? Dr.  Shade  Dental Screening: Recommended annual dental exams  for proper oral hygiene  Community Resource Referral / Chronic Care Management: CRR required this visit?  No   CCM required this visit?  No      Plan:     I have personally reviewed and noted the following in the patient's chart:   . Medical and social history . Use of alcohol, tobacco or illicit drugs  . Current medications and supplements . Functional ability and status . Nutritional status . Physical activity . Advanced directives . List of other physicians . Hospitalizations, surgeries, and ER visits in previous 12 months . Vitals . Screenings to include cognitive, depression, and falls . Referrals and appointments  In addition, I have reviewed and discussed with patient certain preventive protocols, quality metrics, and best practice recommendations. A written personalized care plan for preventive services as well as general preventive health recommendations were provided to patient.     [name], LPN   [id number]   Nurse Notes: pt scheduled for follow up 07/09/20 and would like to discuss Dr. Army Melia taking over prescription for gabapentin due to prescribing provider retiring.

## 2020-06-25 NOTE — Patient Instructions (Signed)
Sandra Mckinney , Thank you for taking time to come for your Medicare Wellness Visit. I appreciate your ongoing commitment to your health goals. Please review the following plan we discussed and let me know if I can assist you in the future.   Screening recommendations/referrals: Colonoscopy: Cologuard done 04/02/20. Repeat as directed Bone Density: done 07/17/18. Please discuss with your provider at next appt.  Recommended yearly ophthalmology/optometry visit for glaucoma screening and checkup Recommended yearly dental visit for hygiene and checkup  Vaccinations: Influenza vaccine: done 03/18/20 Pneumococcal vaccine: done 04/14/18 Tdap vaccine: done 11/24/11 Shingles vaccine: Shingrix discussed. Please contact your pharmacy for coverage information.  Covid-19: done 08/19/19, 09/09/19 & 04/05/20  Advanced directives: Please bring a copy of your health care power of attorney and living will to the office at your convenience.  Conditions/risks identified: Recommend increasing physical activity   Next appointment: Follow up in one year for your annual wellness visit    Preventive Care 65 Years and Older, Female Preventive care refers to lifestyle choices and visits with your health care provider that can promote health and wellness. What does preventive care include?  A yearly physical exam. This is also called an annual well check.  Dental exams once or twice a year.  Routine eye exams. Ask your health care provider how often you should have your eyes checked.  Personal lifestyle choices, including:  Daily care of your teeth and gums.  Regular physical activity.  Eating a healthy diet.  Avoiding tobacco and drug use.  Limiting alcohol use.  Practicing safe sex.  Taking low-dose aspirin every day.  Taking vitamin and mineral supplements as recommended by your health care provider. What happens during an annual well check? The services and screenings done by your health care  provider during your annual well check will depend on your age, overall health, lifestyle risk factors, and family history of disease. Counseling  Your health care provider may ask you questions about your:  Alcohol use.  Tobacco use.  Drug use.  Emotional well-being.  Home and relationship well-being.  Sexual activity.  Eating habits.  History of falls.  Memory and ability to understand (cognition).  Work and work Statistician.  Reproductive health. Screening  You may have the following tests or measurements:  Height, weight, and BMI.  Blood pressure.  Lipid and cholesterol levels. These may be checked every 5 years, or more frequently if you are over 2 years old.  Skin check.  Lung cancer screening. You may have this screening every year starting at age 76 if you have a 30-pack-year history of smoking and currently smoke or have quit within the past 15 years.  Fecal occult blood test (FOBT) of the stool. You may have this test every year starting at age 74.  Flexible sigmoidoscopy or colonoscopy. You may have a sigmoidoscopy every 5 years or a colonoscopy every 10 years starting at age 48.  Hepatitis C blood test.  Hepatitis B blood test.  Sexually transmitted disease (STD) testing.  Diabetes screening. This is done by checking your blood sugar (glucose) after you have not eaten for a while (fasting). You may have this done every 1-3 years.  Bone density scan. This is done to screen for osteoporosis. You may have this done starting at age 29.  Mammogram. This may be done every 1-2 years. Talk to your health care provider about how often you should have regular mammograms. Talk with your health care provider about your test results, treatment options, and  if necessary, the need for more tests. Vaccines  Your health care provider may recommend certain vaccines, such as:  Influenza vaccine. This is recommended every year.  Tetanus, diphtheria, and acellular  pertussis (Tdap, Td) vaccine. You may need a Td booster every 10 years.  Zoster vaccine. You may need this after age 40.  Pneumococcal 13-valent conjugate (PCV13) vaccine. One dose is recommended after age 57.  Pneumococcal polysaccharide (PPSV23) vaccine. One dose is recommended after age 3. Talk to your health care provider about which screenings and vaccines you need and how often you need them. This information is not intended to replace advice given to you by your health care provider. Make sure you discuss any questions you have with your health care provider. Document Released: 07/20/2015 Document Revised: 03/12/2016 Document Reviewed: 04/24/2015 Elsevier Interactive Patient Education  2017 [place] Prevention in the Home Falls can cause injuries. They can happen to people of all ages. There are many things you can do to make your home safe and to help prevent falls. What can I do on the outside of my home?  Regularly fix the edges of walkways and driveways and fix any cracks.  Remove anything that might make you trip as you walk through a door, such as a raised step or threshold.  Trim any bushes or trees on the path to your home.  Use bright outdoor lighting.  Clear any walking paths of anything that might make someone trip, such as rocks or tools.  Regularly check to see if handrails are loose or broken. Make sure that both sides of any steps have handrails.  Any raised decks and porches should have guardrails on the edges.  Have any leaves, snow, or ice cleared regularly.  Use sand or salt on walking paths during winter.  Clean up any spills in your garage right away. This includes oil or grease spills. What can I do in the bathroom?  Use night lights.  Install grab bars by the toilet and in the tub and shower. Do not use towel bars as grab bars.  Use non-skid mats or decals in the tub or shower.  If you need to sit down in the shower, use a plastic,  non-slip stool.  Keep the floor dry. Clean up any water that spills on the floor as soon as it happens.  Remove soap buildup in the tub or shower regularly.  Attach bath mats securely with double-sided non-slip rug tape.  Do not have throw rugs and other things on the floor that can make you trip. What can I do in the bedroom?  Use night lights.  Make sure that you have a light by your bed that is easy to reach.  Do not use any sheets or blankets that are too big for your bed. They should not hang down onto the floor.  Have a firm chair that has side arms. You can use this for support while you get dressed.  Do not have throw rugs and other things on the floor that can make you trip. What can I do in the kitchen?  Clean up any spills right away.  Avoid walking on wet floors.  Keep items that you use a lot in easy-to-reach places.  If you need to reach something above you, use a strong step stool that has a grab bar.  Keep electrical cords out of the way.  Do not use floor polish or wax that makes floors slippery. If you  must use wax, use non-skid floor wax.  Do not have throw rugs and other things on the floor that can make you trip. What can I do with my stairs?  Do not leave any items on the stairs.  Make sure that there are handrails on both sides of the stairs and use them. Fix handrails that are broken or loose. Make sure that handrails are as long as the stairways.  Check any carpeting to make sure that it is firmly attached to the stairs. Fix any carpet that is loose or worn.  Avoid having throw rugs at the top or bottom of the stairs. If you do have throw rugs, attach them to the floor with carpet tape.  Make sure that you have a light switch at the top of the stairs and the bottom of the stairs. If you do not have them, ask someone to add them for you. What else can I do to help prevent falls?  Wear shoes that:  Do not have high heels.  Have rubber  bottoms.  Are comfortable and fit you well.  Are closed at the toe. Do not wear sandals.  If you use a stepladder:  Make sure that it is fully opened. Do not climb a closed stepladder.  Make sure that both sides of the stepladder are locked into place.  Ask someone to hold it for you, if possible.  Clearly mark and make sure that you can see:  Any grab bars or handrails.  First and last steps.  Where the edge of each step is.  Use tools that help you move around (mobility aids) if they are needed. These include:  Canes.  Walkers.  Scooters.  Crutches.  Turn on the lights when you go into a dark area. Replace any light bulbs as soon as they burn out.  Set up your furniture so you have a clear path. Avoid moving your furniture around.  If any of your floors are uneven, fix them.  If there are any pets around you, be aware of where they are.  Review your medicines with your doctor. Some medicines can make you feel dizzy. This can increase your chance of falling. Ask your doctor what other things that you can do to help prevent falls. This information is not intended to replace advice given to you by your health care provider. Make sure you discuss any questions you have with your health care provider. Document Released: 04/19/2009 Document Revised: 11/29/2015 Document Reviewed: 07/28/2014 Elsevier Interactive Patient Education  2017 [employer].

## 2020-07-06 ENCOUNTER — Encounter: Payer: Self-pay | Admitting: Internal Medicine

## 2020-07-09 ENCOUNTER — Ambulatory Visit: Payer: Medicare PPO | Admitting: Internal Medicine

## 2020-07-11 ENCOUNTER — Other Ambulatory Visit: Payer: Self-pay | Admitting: Internal Medicine

## 2020-07-11 DIAGNOSIS — M797 Fibromyalgia: Secondary | ICD-10-CM

## 2020-07-11 MED ORDER — HYDROCODONE-ACETAMINOPHEN 5-325 MG PO TABS: 1.0000 | ORAL_TABLET | Freq: Four times a day (QID) | ORAL | 0 refills | Status: DC | PRN

## 2020-07-11 NOTE — Telephone Encounter (Signed)
Please review. Last office visit 01/11/2020.  KP

## 2020-07-11 NOTE — Telephone Encounter (Signed)
Requested medication (s) are due for refill today: yes  Requested medication (s) are on the active medication list: yes  Last refill:  06/11/2020  Future visit scheduled: yes  Notes to clinic:  this refill cannot be delegated   Requested Prescriptions  Pending Prescriptions Disp Refills   HYDROcodone-acetaminophen (NORCO/VICODIN) 5-325 MG tablet 90 tablet 0    Sig: Take 1 tablet by mouth every 6 (six) hours as needed for moderate pain. Fibromyalgia.      Not Delegated - Analgesics:  Opioid Agonist Combinations Failed - 07/11/2020  9:20 AM      Failed - This refill cannot be delegated      Failed - Urine Drug Screen completed in last 360 days      Failed - Valid encounter within last 6 months    Recent Outpatient Visits           6 months ago Depression, major, single episode, in partial remission [facility])   [facility] Reubin Milan, MD   7 months ago Fibromyalgia   [facility] Reubin Milan, MD   8 months ago Depression, major, single episode, in partial remission [facility])   [facility] Reubin Milan, MD   1 year ago Acute non-recurrent maxillary sinusitis   [facility] Reubin Milan, MD   2 years ago Acute recurrent frontal sinusitis   [facility] Reubin Milan, MD

## 2020-08-09 ENCOUNTER — Other Ambulatory Visit: Payer: Self-pay | Admitting: Internal Medicine

## 2020-08-09 DIAGNOSIS — M797 Fibromyalgia: Secondary | ICD-10-CM

## 2020-08-09 MED ORDER — HYDROCODONE-ACETAMINOPHEN 5-325 MG PO TABS: 1.0000 | ORAL_TABLET | Freq: Four times a day (QID) | ORAL | 0 refills | Status: DC | PRN

## 2020-08-09 NOTE — Telephone Encounter (Signed)
Requested medication (s) are due for refill today: yes  Requested medication (s) are on the active medication list: yes  Last refill:  07/11/20  Future visit scheduled: no  Notes to clinic:  not delegated; no valid encounter within last 6 months    Requested Prescriptions  Pending Prescriptions Disp Refills   HYDROcodone-acetaminophen (NORCO/VICODIN) 5-325 MG tablet 90 tablet 0    Sig: Take 1 tablet by mouth every 6 (six) hours as needed for moderate pain. Fibromyalgia.      Not Delegated - Analgesics:  Opioid Agonist Combinations Failed - 08/09/2020  9:17 AM      Failed - This refill cannot be delegated      Failed - Urine Drug Screen completed in last 360 days      Failed - Valid encounter within last 6 months    Recent Outpatient Visits           7 months ago Depression, major, single episode, in partial remission [facility])   [facility] Glean [facility]   7 months ago [facility] Glean Hess, MD   9 months ago Depression, major, single episode, in partial remission [facility])   [facility] Glean [facility], MD   1 year ago Acute non-recurrent maxillary sinusitis   [facility]   2 years ago Acute recurrent frontal sinusitis   [facility] Glean Hess, MD

## 2020-08-09 NOTE — Telephone Encounter (Signed)
Please review. Last office visit 06/25/2021.  KP

## 2020-08-09 NOTE — Telephone Encounter (Signed)
Error 06/25/2020

## 2020-08-09 NOTE — Telephone Encounter (Signed)
Tried to call patients but phone rings busy.

## 2020-08-09 NOTE — Telephone Encounter (Signed)
Copied from [place] [id number]. Topic: Quick Communication - Rx Refill/Question >> Aug 09, 2020  9:10 AM Leward Quan A wrote: Medication: HYDROcodone-acetaminophen (NORCO/VICODIN) 5-325 MG tablet   Has the patient contacted their pharmacy? Yes.   (Agent: If no, request that the patient contact the pharmacy for the refill.) (Agent: If yes, when and what did the pharmacy advise?)  Preferred Pharmacy (with phone number or street name): [id number] [place] [place] [facility] [address]  Phone:  [phone] Fax:  [phone]     Agent: Please be advised that RX refills may take up to 3 business days. We ask that you follow-up with your pharmacy.

## 2020-09-03 ENCOUNTER — Other Ambulatory Visit: Payer: Self-pay | Admitting: Internal Medicine

## 2020-09-03 NOTE — Telephone Encounter (Signed)
Requested medication (s) are due for refill today: no  Requested medication (s) are on the active medication list:  yes  Last refill: 07/02/2020  Future visit scheduled: no  Notes to clinic: overdue for follow up appt   LOV12/20/2021   Requested Prescriptions  Pending Prescriptions Disp Refills   furosemide (LASIX) 20 MG tablet [Pharmacy Med Name: FUROSEMIDE 20 MG Tablet] 90 tablet 1    Sig: TAKE 1 TABLET EVERY DAY      Cardiovascular:  Diuretics - Loop Failed - 09/03/2020  5:43 PM      Failed - K in normal range and within 360 days    Potassium  Date Value Ref Range Status  07/06/2019 4.3 3.4 - 5.3 Final  07/29/2012 4.1 3.5 - 5.1 mmol/L Final          Failed - Ca in normal range and within 360 days    Calcium  Date Value Ref Range Status  12/06/2014 9.4 8.7 - 10.3 mg/dL Final   Calcium, Total  Date Value Ref Range Status  07/29/2012 9.1 8.5 - 10.1 mg/dL Final          Failed - Na in normal range and within 360 days    Sodium  Date Value Ref Range Status  07/06/2019 139 137 - 147 Final  07/29/2012 140 136 - 145 mmol/L Final          Failed - Cr in normal range and within 360 days    Creatinine  Date Value Ref Range Status  07/06/2019 1.0 0.5 - 1.1 Final  07/29/2012 0.93 0.60 - 1.30 mg/dL Final   Creatinine, Ser  Date Value Ref Range Status  12/06/2014 1.00 0.57 - 1.00 mg/dL Final          Failed - Last BP in normal range    BP Readings from Last 1 Encounters:  05/16/20 (!) 176/95          Failed - Valid encounter within last 6 months    Recent Outpatient Visits           7 months ago Depression, major, single episode, in partial remission [facility])   [facility] Glean Hess, MD   8 months ago [place], Laura H, MD   9 months ago Depression, major, single episode, in partial remission [facility])   [facility] Glean [facility], [facility]   1 year ago Acute non-recurrent maxillary sinusitis    [facility] Glean Hess, MD   2 years ago Acute recurrent frontal sinusitis   [facility] Glean [facility], MD

## 2020-09-06 ENCOUNTER — Other Ambulatory Visit: Payer: Self-pay | Admitting: Internal Medicine

## 2020-09-06 DIAGNOSIS — M797 Fibromyalgia: Secondary | ICD-10-CM

## 2020-09-06 MED ORDER — HYDROCODONE-ACETAMINOPHEN 5-325 MG PO TABS: 1.0000 | ORAL_TABLET | Freq: Four times a day (QID) | ORAL | 0 refills | Status: DC | PRN

## 2020-09-06 NOTE — Telephone Encounter (Signed)
Copied from [place] [id number]. Topic: Quick Communication - Rx Refill/Question >> Sep 06, 2020  9:16 AM Yvette Rack wrote: Medication: HYDROcodone-acetaminophen (NORCO/VICODIN) 5-325 MG tablet  Has the patient contacted their pharmacy?  no  Preferred Pharmacy (with phone number or street name): [id number] [place] [place]   Phone: [phone]   Fax: [phone]  Agent: Please be advised that RX refills may take up to 3 business days. We ask that you follow-up with your pharmacy.

## 2020-09-06 NOTE — Telephone Encounter (Signed)
Requested medication (s) are due for refill today - yes  Requested medication (s) are on the active medication list -yes  Future visit scheduled -yes  Last refill: 08/09/20  Notes to clinic: Request for non delegated Rx  Requested Prescriptions  Pending Prescriptions Disp Refills   HYDROcodone-acetaminophen (NORCO/VICODIN) 5-325 MG tablet 90 tablet 0    Sig: Take 1 tablet by mouth every 6 (six) hours as needed for moderate pain. Fibromyalgia.      Not Delegated - Analgesics:  Opioid Agonist Combinations Failed - 09/06/2020  9:45 AM      Failed - This refill cannot be delegated      Failed - Urine Drug Screen completed in last 360 days      Failed - Valid encounter within last 6 months    Recent Outpatient Visits           7 months ago Depression, major, single episode, in partial remission [facility])   [facility] Glean [facility], MD   8 months ago [place], MD   10 months ago Depression, major, single episode, in partial remission [facility])   [facility] Glean Hess, MD   1 year ago Acute non-recurrent maxillary sinusitis   [facility] Glean Hess, MD   2 years ago Acute recurrent frontal sinusitis   [facility] Glean Hess, MD       Future Appointments             In 2 weeks Glean Hess, MD [facility]                 Requested Prescriptions  Pending Prescriptions Disp Refills   HYDROcodone-acetaminophen (NORCO/VICODIN) 5-325 MG tablet 90 tablet 0    Sig: Take 1 tablet by mouth every 6 (six) hours as needed for moderate pain. Fibromyalgia.      Not Delegated - Analgesics:  Opioid Agonist Combinations Failed - 09/06/2020  9:45 AM      Failed - This refill cannot be delegated      Failed - Urine Drug Screen completed in last 360 days      Failed - Valid encounter within last 6 months    Recent Outpatient Visits           7 months ago  Depression, major, single episode, in partial remission [facility])   [facility] Glean Hess, MD   8 months ago [place], MD   10 months ago Depression, major, single episode, in partial remission [facility])   [facility] Glean Hess, MD   1 year ago Acute non-recurrent maxillary sinusitis   [facility] Glean Hess, [facility]   2 years ago Acute recurrent frontal sinusitis   [facility] Glean Hess, MD       Future Appointments             In 2 weeks Army Melia Jesse Sans, MD [facility]

## 2020-09-26 ENCOUNTER — Encounter: Payer: Self-pay | Admitting: Internal Medicine

## 2020-09-26 ENCOUNTER — Ambulatory Visit: Payer: Medicare PPO | Admitting: Internal Medicine

## 2020-09-26 ENCOUNTER — Other Ambulatory Visit: Payer: Self-pay

## 2020-09-26 VITALS — BP 128/80 | HR 53 | Temp 97.8°F | Ht 62.0 in | Wt 163.0 lb

## 2020-09-26 DIAGNOSIS — R413 Other amnesia: Secondary | ICD-10-CM

## 2020-09-26 DIAGNOSIS — F324 Major depressive disorder, single episode, in partial remission: Secondary | ICD-10-CM

## 2020-09-26 DIAGNOSIS — M797 Fibromyalgia: Secondary | ICD-10-CM

## 2020-09-26 DIAGNOSIS — F1190 Opioid use, unspecified, uncomplicated: Secondary | ICD-10-CM | POA: Diagnosis not present

## 2020-09-26 DIAGNOSIS — G2581 Restless legs syndrome: Secondary | ICD-10-CM | POA: Diagnosis not present

## 2020-09-26 NOTE — Progress Notes (Signed)
Date:  09/26/2020   Name:  Sandra Mckinney   DOB:  01-02-1946   MRN:  [id number]   Chief Complaint: Migraine  Migraine  This is a chronic problem. The problem occurs intermittently. The pain quality is similar to prior headaches. Pertinent negatives include no abdominal pain, coughing, dizziness or fever. She has tried beta blockers and Excedrin for the symptoms. The treatment provided significant relief.  Fibromyalgia - chronic longstanding and unchanged. On gabapentin and hydrocodone tid. Urine drug screen done last year was appropriate.    Restless leg - on Requip four times per day to control sx.  Memory loss - being followed by Neurology.  On Aricept 10 mg and recently added Celexa.  Lab Results  Component Value Date   CREATININE 1.0 04/19/2020   BUN 14 04/19/2020   NA 141 04/19/2020   K 3.8 04/19/2020   CL 103 04/19/2020   CO2 6 (A) 04/19/2020   Lab Results  Component Value Date   CHOL 208 (H) 04/16/2016   HDL 92 04/16/2016   LDLCALC 87 04/16/2016   TRIG 145 04/16/2016   CHOLHDL 2.3 04/16/2016   Lab Results  Component Value Date   TSH 0.43 02/08/2020   Lab Results  Component Value Date   HGBA1C 5.8 02/08/2020   Lab Results  Component Value Date   WBC 8.7 04/19/2020   HGB 12.7 04/19/2020   HCT 39 04/19/2020   MCV 88 07/29/2012   PLT 213 04/19/2020   Lab Results  Component Value Date   ALT 89 (A) 04/19/2020   AST 29 04/19/2020   ALKPHOS 82 07/06/2019     Review of Systems  Constitutional: Negative for chills, fatigue and fever.  Respiratory: Negative for cough, chest tightness and wheezing.   Cardiovascular: Negative for chest pain.  Gastrointestinal: Negative for abdominal pain.  Musculoskeletal: Positive for arthralgias and myalgias.  Neurological: Negative for dizziness, light-headedness and headaches.  Psychiatric/Behavioral: Positive for confusion. Negative for dysphoric mood and sleep disturbance. The patient is nervous/anxious.      Patient Active Problem List   Diagnosis Date Noted  . Loss of memory 02/08/2020  . Narcotic use agreement exists 12/14/2019  . Acquired absence of both breasts 06/22/2019  . Fibromyalgia 07/29/2016  . Hyperlipidemia 05/20/2016  . Age-related osteoporosis without current pathological fracture 04/16/2016  . Anemia 12/12/2015  . Neoplasm of left breast, primary tumor staging category Tis: ductal carcinoma in situ (DCIS) 01/03/2015  . History of left breast cancer 01/03/2015  . Neoplasm of right breast, primary tumor staging category Tis: ductal carcinoma in situ (DCIS) 12/27/2014  . Anxiety disorder 10/11/2014  . Plantar fasciitis 10/11/2014  . Local edema 10/11/2014  . Depression, major, single episode, in partial remission ([place]) 10/11/2014  . Hot flash, menopausal 10/11/2014  . Migraine without aura and responsive to treatment 10/11/2014  . Idiopathic insomnia 10/11/2014  . Psoriasis 10/11/2014  . Restless leg 10/11/2014  . Avitaminosis D 10/11/2014    Allergies  Allergen Reactions  . Cephalexin Hives  . Letrozole Swelling  . Raloxifene Hives  . Selective Estrogen Receptor Modulators Hives  . Citalopram Other (See Comments)    Dry mouth   . Cephalosporins   . Duloxetine Hcl   . Exemestane Other (See Comments)  . Lyrica [Pregabalin]   . Oxycodone   . Penicillins   . Prilosec  [Omeprazole]   . Sulfa Antibiotics     Past Surgical History:  Procedure Laterality Date  . ABDOMINAL HYSTERECTOMY  1986  uterine fibroids and endometriosis  . BREAST BIOPSY Right 06/01/12   [place] bx/clip-neg  . BREAST IMPLANT REMOVAL Bilateral 10/19/2019  . BREAST RECONSTRUCTION Bilateral 2017  . BREAST REDUCTION SURGERY  10/19/2019   all breast reconstructions removed  . CARPAL TUNNEL RELEASE Right 2014  . EYE SURGERY Bilateral 2009   catarcts  . MASTECTOMY, RADICAL Bilateral 2016  . REDUCTION MAMMAPLASTY     2001  . THUMB ARTHROSCOPY Right 2014    Social History   Tobacco Use   . Smoking status: Former Smoker    Packs/day: 0.75    Years: 10.00    Pack years: 7.50    Types: Cigarettes    Quit date: 04/13/1974    Years since quitting: 46.4  . Smokeless tobacco: Never Used  . Tobacco comment: smoking cessation materials not required  Vaping Use  . Vaping Use: Never used  Substance Use Topics  . Alcohol use: No    Alcohol/week: 0.0 standard drinks  . Drug use: No     Medication list has been reviewed and updated.  Current Meds  Medication Sig  . aspirin-acetaminophen-caffeine (EXCEDRIN MIGRAINE) 992-426-83 MG tablet Take by mouth every 6 (six) hours as needed for headache.  . busPIRone (BUSPAR) 10 MG tablet TAKE 1 TABLET FOUR TIMES DAILY  . Calcium-Magnesium-Vitamin D 600-40-500 MG-MG-UNIT TB24 Take 1 tablet by mouth daily.  . chlorpheniramine (CHLOR-TRIMETON) 4 MG tablet Take 4 mg by mouth daily.  . cholecalciferol (VITAMIN D) 1000 UNITS tablet Take 1 tablet by mouth daily.  . citalopram (CELEXA) 10 MG tablet Take 1 tablet by mouth daily.  Marland Kitchen donepezil (ARICEPT) 10 MG tablet Take by mouth.  . ferrous sulfate 325 (65 FE) MG tablet Take 325 mg by mouth daily with breakfast.  . furosemide (LASIX) 20 MG tablet TAKE 1 TABLET EVERY DAY  . gabapentin (NEURONTIN) 100 MG capsule TAKE 1 CAPSULE THREE TIMES DAILY (Patient taking differently: Nerve Pain, Fibromyalgia)  . HYDROcodone-acetaminophen (NORCO/VICODIN) 5-325 MG tablet Take 1 tablet by mouth every 6 (six) hours as needed for moderate pain. Fibromyalgia.  Marland Kitchen loperamide (IMODIUM A-D) 2 MG tablet Take 2 mg by mouth 4 (four) times daily as needed for diarrhea or loose stools.  . magnesium oxide (MAG-OX) 400 MG tablet Take 400 mg by mouth 2 (two) times daily.  . MULTIPLE VITAMINS-MINERALS PO Take 1 tablet by mouth daily.  . propranolol ER (INDERAL LA) 60 MG 24 hr capsule Take 1 capsule (60 mg total) by mouth daily.  Marland Kitchen rOPINIRole (REQUIP) 0.5 MG tablet TAKE 7 TABLETS AT BEDTIME    PHQ 2/9 Scores 09/26/2020  06/25/2020 01/11/2020 12/14/2019  PHQ - 2 Score 1 0 0 0  PHQ- 9 Score 1 - 1 0    GAD 7 : Generalized Anxiety Score 09/26/2020 01/11/2020 12/14/2019  Nervous, Anxious, on Edge 0 2 0  Control/stop worrying 0 0 0  Worry too much - different things 0 0 0  Trouble relaxing 0 0 0  Restless 0 0 0  Easily annoyed or irritable 0 2 0  Afraid - awful might happen 0 0 0  Total GAD 7 Score 0 4 0  Anxiety Difficulty - Not difficult at all Not difficult at all    BP Readings from Last 3 Encounters:  09/26/20 128/80  05/16/20 (!) 176/95  12/14/19 124/82    Physical Exam Constitutional:      Appearance: Normal appearance.  Cardiovascular:     Rate and Rhythm: Normal rate and regular rhythm.  Pulses: Normal pulses.     Heart sounds: No murmur heard.   Pulmonary:     Effort: Pulmonary effort is normal.     Breath sounds: No wheezing or rhonchi.  Musculoskeletal:     Cervical back: Normal range of motion.     Right lower leg: No edema.     Left lower leg: No edema.  Lymphadenopathy:     Cervical: No cervical adenopathy.  Neurological:     Mental Status: She is alert.  Psychiatric:        Mood and Affect: Mood normal.        Behavior: Behavior normal.     Wt Readings from Last 3 Encounters:  09/26/20 163 lb (73.9 kg)  05/16/20 167 lb 6.4 oz (75.9 kg)  01/11/20 158 lb (71.7 kg)    BP 128/80 (BP Location: Right Leg, Patient Position: Sitting) Comment: 128 palpated  Pulse (!) 53   Temp 97.8 F (36.6 C) (Oral)   Ht 5\' 2"  (1.575 m)   Wt 163 lb (73.9 kg)   SpO2 98%   BMI 29.81 kg/m   Assessment and Plan: 1. Fibromyalgia Chronic stable MSK pain Managed with chronic narcotics and gabapentin No evidence of abuse or misuse  2. Depression, major, single episode, in partial remission (HCC) Clinically stable on current regimen with good control of symptoms, No SI or HI. Will continue current therapy.  3. Restless leg Managed well with Requip  4. Loss of memory Followed by  Neurology Now on higher dose Aricept and Celexa  5. Chronic narcotic use Due for annual drug screen - Urine Drug Panel 7   Partially dictated using Dragon software. Any errors are unintentional.  Halina Maidens, MD [facility]  09/26/2020

## 2020-10-02 LAB — URINE DRUG PANEL 7
Amphetamines, Urine: NEGATIVE ng/mL
Barbiturate Quant, Ur: NEGATIVE ng/mL
Benzodiazepine Quant, Ur: NEGATIVE ng/mL
Cannabinoid Quant, Ur: NEGATIVE ng/mL
Cocaine (Metab.): NEGATIVE ng/mL
Opiate Quant, Ur: NEGATIVE
PCP Quant, Ur: NEGATIVE ng/mL

## 2020-10-05 ENCOUNTER — Other Ambulatory Visit: Payer: Self-pay | Admitting: Internal Medicine

## 2020-10-05 DIAGNOSIS — M797 Fibromyalgia: Secondary | ICD-10-CM

## 2020-10-05 MED ORDER — HYDROCODONE-ACETAMINOPHEN 5-325 MG PO TABS: 1.0000 | ORAL_TABLET | Freq: Four times a day (QID) | ORAL | 0 refills | Status: DC | PRN

## 2020-10-05 NOTE — Telephone Encounter (Signed)
Refill if appropriate.  Please advise. Last filled 09/06/2020.

## 2020-10-05 NOTE — Telephone Encounter (Signed)
Requested medication (s) are due for refill today:   Provider to review  Requested medication (s) are on the active medication list:   Yes  Future visit scheduled:   No   Seen a week ago by Dr. Army Melia   Last ordered: 09/06/2020 #90, 0 refills  Non delegated refill   Requested Prescriptions  Pending Prescriptions Disp Refills   HYDROcodone-acetaminophen (NORCO/VICODIN) 5-325 MG tablet 90 tablet 0    Sig: Take 1 tablet by mouth every 6 (six) hours as needed for moderate pain. Fibromyalgia.      Not Delegated - Analgesics:  Opioid Agonist Combinations Failed - 10/05/2020  9:29 AM      Failed - This refill cannot be delegated      Failed - Urine Drug Screen completed in last 360 days      Passed - Valid encounter within last 6 months    Recent Outpatient Visits           1 week ago [facility] Glean Hess, MD   8 months ago Depression, major, single episode, in partial remission [facility])   [facility] Glean Hess, MD   9 months ago [place], MD   10 months ago Depression, major, single episode, in partial remission [facility])   [facility] Glean Hess, MD   1 year ago Acute non-recurrent maxillary sinusitis   [facility] Glean [facility], MD

## 2020-10-05 NOTE — Telephone Encounter (Signed)
Medication: HYDROcodone-acetaminophen (NORCO/VICODIN) 5-325 MG tablet [[id number]]   Has the patient contacted their pharmacy? YES  (Agent: If no, request that the patient contact the pharmacy for the refill.) (Agent: If yes, when and what did the pharmacy advise?)  Preferred Pharmacy (with phone number or street name[id number] [place] [place] [address] Phone: [phone] Fax: [phone] Hours: Not open 24 hours    Agent: Please be advised that RX refills may take up to 3 business days. We ask that you follow-up with your pharmacy.

## 2020-10-15 ENCOUNTER — Other Ambulatory Visit: Payer: Self-pay | Admitting: Internal Medicine

## 2020-10-22 ENCOUNTER — Other Ambulatory Visit: Payer: Self-pay | Admitting: Internal Medicine

## 2020-10-22 DIAGNOSIS — G2581 Restless legs syndrome: Secondary | ICD-10-CM

## 2020-10-29 DIAGNOSIS — Z86018 Personal history of other benign neoplasm: Secondary | ICD-10-CM | POA: Diagnosis not present

## 2020-10-29 DIAGNOSIS — L578 Other skin changes due to chronic exposure to nonionizing radiation: Secondary | ICD-10-CM | POA: Diagnosis not present

## 2020-10-29 DIAGNOSIS — Z872 Personal history of diseases of the skin and subcutaneous tissue: Secondary | ICD-10-CM | POA: Diagnosis not present

## 2020-10-29 DIAGNOSIS — L821 Other seborrheic keratosis: Secondary | ICD-10-CM | POA: Diagnosis not present

## 2020-10-29 DIAGNOSIS — L249 Irritant contact dermatitis, unspecified cause: Secondary | ICD-10-CM | POA: Diagnosis not present

## 2020-11-02 ENCOUNTER — Other Ambulatory Visit: Payer: Self-pay | Admitting: Internal Medicine

## 2020-11-02 DIAGNOSIS — M797 Fibromyalgia: Secondary | ICD-10-CM

## 2020-11-02 NOTE — Telephone Encounter (Signed)
Requested medication (s) are due for refill today:   Requested medication (s) are on the active medication list: Yes  Last refill:  10/05/20  Future visit scheduled: No  Notes to clinic:  See request.    Requested Prescriptions  Pending Prescriptions Disp Refills   HYDROcodone-acetaminophen (NORCO/VICODIN) 5-325 MG tablet 90 tablet 0    Sig: Take 1 tablet by mouth every 6 (six) hours as needed for moderate pain. Fibromyalgia.      Not Delegated - Analgesics:  Opioid Agonist Combinations Failed - 11/02/2020  1:53 PM      Failed - This refill cannot be delegated      Failed - Urine Drug Screen completed in last 360 days      Passed - Valid encounter within last 6 months    Recent Outpatient Visits           1 month ago [facility] Glean [facility], MD   9 months ago Depression, major, single episode, in partial remission [facility])   [facility] Glean [facility], MD   10 months ago [place], MD   11 months ago Depression, major, single episode, in partial remission [facility])   [facility] Glean Hess, MD   1 year ago Acute non-recurrent maxillary sinusitis   [facility] Glean Hess, MD

## 2020-11-02 NOTE — Telephone Encounter (Signed)
Medication Refill - Medication:   HYDROcodone-acetaminophen (NORCO/VICODIN) 5-325 MG tablet   Has the patient contacted their pharmacy? Yes.  need doctor approval.   Preferred Pharmacy (with phone number or street name):   [id number] [place], [place] [address]  Phone: [phone] Fax: [phone]     Agent: Please be advised that RX refills may take up to 3 business days. We ask that you follow-up with your pharmacy.

## 2020-11-04 MED ORDER — HYDROCODONE-ACETAMINOPHEN 5-325 MG PO TABS: 1.0000 | ORAL_TABLET | Freq: Four times a day (QID) | ORAL | 0 refills | Status: DC | PRN

## 2020-11-06 ENCOUNTER — Other Ambulatory Visit: Payer: Self-pay | Admitting: Internal Medicine

## 2020-11-06 DIAGNOSIS — F39 Unspecified mood [affective] disorder: Secondary | ICD-10-CM | POA: Diagnosis not present

## 2020-11-06 DIAGNOSIS — R413 Other amnesia: Secondary | ICD-10-CM | POA: Diagnosis not present

## 2020-11-06 DIAGNOSIS — G3184 Mild cognitive impairment, so stated: Secondary | ICD-10-CM | POA: Diagnosis not present

## 2020-11-30 ENCOUNTER — Other Ambulatory Visit: Payer: Self-pay | Admitting: Internal Medicine

## 2020-11-30 DIAGNOSIS — M797 Fibromyalgia: Secondary | ICD-10-CM

## 2020-11-30 MED ORDER — HYDROCODONE-ACETAMINOPHEN 5-325 MG PO TABS: 1.0000 | ORAL_TABLET | Freq: Four times a day (QID) | ORAL | 0 refills | Status: DC | PRN

## 2020-11-30 NOTE — Telephone Encounter (Signed)
Copied from [place] [id number]. Topic: Quick Communication - Rx Refill/Question >> Nov 30, 2020  9:15 AM Yvette Rack wrote: Medication: HYDROcodone-acetaminophen (NORCO/VICODIN) 5-325 MG tablet  Has the patient contacted their pharmacy? no  Preferred Pharmacy (with phone number or street name): [id number] [place], [place] Phone: [phone]   Fax: [phone]  Agent: Please be advised that RX refills may take up to 3 business days. We ask that you follow-up with your pharmacy.

## 2020-11-30 NOTE — Telephone Encounter (Signed)
Requested medication (s) are due for refill today: yes  Requested medication (s) are on the active medication list: yes  Last refill:  11/04/20  Future visit scheduled: no  Notes to clinic:  not delegated    Requested Prescriptions  Pending Prescriptions Disp Refills   HYDROcodone-acetaminophen (NORCO/VICODIN) 5-325 MG tablet 90 tablet 0    Sig: Take 1 tablet by mouth every 6 (six) hours as needed for moderate pain. Fibromyalgia.      Not Delegated - Analgesics:  Opioid Agonist Combinations Failed - 11/30/2020 10:35 AM      Failed - This refill cannot be delegated      Failed - Urine Drug Screen completed in last 360 days      Passed - Valid encounter within last 6 months    Recent Outpatient Visits           2 months ago [facility] Glean [facility], MD   10 months ago Depression, major, single episode, in partial remission [facility])   [facility] Glean Hess, MD   11 months ago [facility] Glean Hess, MD   1 year ago Depression, major, single episode, in partial remission [facility])   [facility] Glean Hess, MD   1 year ago Acute non-recurrent maxillary sinusitis   [facility] Glean Hess, MD

## 2020-11-30 NOTE — Telephone Encounter (Signed)
Please review. Last office visit 09/26/2020.  KP

## 2020-12-10 DIAGNOSIS — L568 Other specified acute skin changes due to ultraviolet radiation: Secondary | ICD-10-CM | POA: Diagnosis not present

## 2020-12-10 DIAGNOSIS — L570 Actinic keratosis: Secondary | ICD-10-CM | POA: Diagnosis not present

## 2020-12-10 DIAGNOSIS — X32XXXS Exposure to sunlight, sequela: Secondary | ICD-10-CM | POA: Diagnosis not present

## 2020-12-14 ENCOUNTER — Other Ambulatory Visit: Payer: Self-pay

## 2020-12-14 ENCOUNTER — Encounter: Payer: Self-pay | Admitting: Internal Medicine

## 2020-12-14 ENCOUNTER — Ambulatory Visit (INDEPENDENT_AMBULATORY_CARE_PROVIDER_SITE_OTHER): Payer: Medicare PPO | Admitting: Internal Medicine

## 2020-12-14 VITALS — HR 60 | Temp 98.4°F | Ht 62.0 in | Wt 174.0 lb

## 2020-12-14 DIAGNOSIS — J0100 Acute maxillary sinusitis, unspecified: Secondary | ICD-10-CM | POA: Diagnosis not present

## 2020-12-14 DIAGNOSIS — H6982 Other specified disorders of Eustachian tube, left ear: Secondary | ICD-10-CM

## 2020-12-14 DIAGNOSIS — R011 Cardiac murmur, unspecified: Secondary | ICD-10-CM | POA: Diagnosis not present

## 2020-12-14 MED ORDER — PREDNISONE 10 MG PO TABS: 10.0000 mg | ORAL_TABLET | ORAL | 0 refills | Status: AC

## 2020-12-14 MED ORDER — AZITHROMYCIN 250 MG PO TABS: ORAL_TABLET | ORAL | 0 refills | Status: AC

## 2020-12-14 NOTE — Progress Notes (Signed)
Date:  12/14/2020   Name:  Sandra Mckinney   DOB:  1946/02/28   MRN:  [id number]   Chief Complaint: Sinusitis (X2 months, Ringing in left ear, swollen cant put a q tip in it, cant sleep at night/ cant breath at night, post nasal drip, nose running clear mucous, no covid test )  Sinus Problem This is a recurrent problem. The problem is unchanged. There has been no fever. Associated symptoms include congestion, ear pain and sinus pressure. Pertinent negatives include no chills, coughing, headaches, hoarse voice, shortness of breath or sore throat. Treatments tried: decongestants. The treatment provided no relief.   Lab Results  Component Value Date   CREATININE 1.0 04/19/2020   BUN 14 04/19/2020   NA 141 04/19/2020   K 3.8 04/19/2020   CL 103 04/19/2020   CO2 6 (A) 04/19/2020   Lab Results  Component Value Date   CHOL 208 (H) 04/16/2016   HDL 92 04/16/2016   LDLCALC 87 04/16/2016   TRIG 145 04/16/2016   CHOLHDL 2.3 04/16/2016   Lab Results  Component Value Date   TSH 0.43 02/08/2020   Lab Results  Component Value Date   HGBA1C 5.8 02/08/2020   Lab Results  Component Value Date   WBC 8.7 04/19/2020   HGB 12.7 04/19/2020   HCT 39 04/19/2020   MCV 88 07/29/2012   PLT 213 04/19/2020   Lab Results  Component Value Date   ALT 89 (A) 04/19/2020   AST 29 04/19/2020   ALKPHOS 82 07/06/2019     Review of Systems  Constitutional:  Negative for chills, fatigue and fever.  HENT:  Positive for congestion, ear pain, sinus pressure and tinnitus. Negative for ear discharge (but left ear canal feels swollen), hoarse voice, sore throat and trouble swallowing.   Respiratory:  Positive for chest tightness. Negative for cough, shortness of breath and wheezing.   Cardiovascular:  Positive for chest pain (one episode of discomfort across the whole chest). Negative for palpitations and leg swelling.  Gastrointestinal:  Negative for abdominal pain, diarrhea and nausea.   Neurological:  Negative for headaches.   Patient Active Problem List   Diagnosis Date Noted   Loss of memory 02/08/2020   Narcotic use agreement exists 12/14/2019   Acquired absence of both breasts 06/22/2019   Fibromyalgia 07/29/2016   Hyperlipidemia 05/20/2016   Age-related osteoporosis without current pathological fracture 04/16/2016   Anemia 12/12/2015   Neoplasm of left breast, primary tumor staging category Tis: ductal carcinoma in situ (DCIS) 01/03/2015   History of left breast cancer 01/03/2015   Neoplasm of right breast, primary tumor staging category Tis: ductal carcinoma in situ (DCIS) 12/27/2014   Anxiety disorder 10/11/2014   Plantar fasciitis 10/11/2014   Local edema 10/11/2014   Depression, major, single episode, in partial remission ([place]) 10/11/2014   Hot flash, menopausal 10/11/2014   Migraine without aura and responsive to treatment 10/11/2014   Idiopathic insomnia 10/11/2014   Psoriasis 10/11/2014   Restless leg 10/11/2014   Avitaminosis D 10/11/2014    Allergies  Allergen Reactions   Cephalexin Hives   Letrozole Swelling   Raloxifene Hives   Selective Estrogen Receptor Modulators Hives   Citalopram Other (See Comments)    Dry mouth    Cephalosporins    Duloxetine Hcl    Exemestane Other (See Comments)   Lyrica [Pregabalin]    Oxycodone    Penicillins    Prilosec  [Omeprazole]    Sulfa Antibiotics  Past Surgical History:  Procedure Laterality Date   ABDOMINAL HYSTERECTOMY  1986   uterine fibroids and endometriosis   BREAST BIOPSY Right 06/01/12   [place] bx/clip-neg   BREAST IMPLANT REMOVAL Bilateral 10/19/2019   BREAST RECONSTRUCTION Bilateral 2017   BREAST REDUCTION SURGERY  10/19/2019   all breast reconstructions removed   CARPAL TUNNEL RELEASE Right 2014   EYE SURGERY Bilateral 2009   catarcts   MASTECTOMY, RADICAL Bilateral 2016   REDUCTION MAMMAPLASTY     2001   THUMB ARTHROSCOPY Right 2014    Social History   Tobacco Use    Smoking status: Former    Packs/day: 0.75    Years: 10.00    Pack years: 7.50    Types: Cigarettes    Quit date: 04/13/1974    Years since quitting: 46.7   Smokeless tobacco: Never   Tobacco comments:    smoking cessation materials not required  Vaping Use   Vaping Use: Never used  Substance Use Topics   Alcohol use: No    Alcohol/week: 0.0 standard drinks   Drug use: No     Medication list has been reviewed and updated.  Current Meds  Medication Sig   aspirin-acetaminophen-caffeine (EXCEDRIN MIGRAINE) 250-250-65 MG tablet Take by mouth every 6 (six) hours as needed for headache.   busPIRone (BUSPAR) 10 MG tablet TAKE 1 TABLET FOUR TIMES DAILY   Calcium-Magnesium-Vitamin D 600-40-500 MG-MG-UNIT TB24 Take 1 tablet by mouth daily.   chlorpheniramine (CHLOR-TRIMETON) 4 MG tablet Take 4 mg by mouth daily.   cholecalciferol (VITAMIN D) 1000 UNITS tablet Take 1 tablet by mouth daily.   donepezil (ARICEPT) 10 MG tablet Take by mouth.   ferrous sulfate 325 (65 FE) MG tablet Take 325 mg by mouth daily with breakfast.   furosemide (LASIX) 20 MG tablet TAKE 1 TABLET EVERY DAY (NEED MD APPOINTMENT)   gabapentin (NEURONTIN) 100 MG capsule TAKE 1 CAPSULE THREE TIMES DAILY (Patient taking differently: Nerve Pain, Fibromyalgia)   HYDROcodone-acetaminophen (NORCO/VICODIN) 5-325 MG tablet Take 1 tablet by mouth every 6 (six) hours as needed for moderate pain. Fibromyalgia.   loperamide (IMODIUM A-D) 2 MG tablet Take 2 mg by mouth 4 (four) times daily as needed for diarrhea or loose stools.   magnesium oxide (MAG-OX) 400 MG tablet Take 400 mg by mouth 2 (two) times daily.   MULTIPLE VITAMINS-MINERALS PO Take 1 tablet by mouth daily.   propranolol ER (INDERAL LA) 60 MG 24 hr capsule TAKE 1 CAPSULE EVERY DAY   rOPINIRole (REQUIP) 0.5 MG tablet TAKE 7 TABLETS AT BEDTIME    PHQ 2/9 Scores 12/14/2020 09/26/2020 06/25/2020 01/11/2020  PHQ - 2 Score 0 1 0 0  PHQ- 9 Score 8 1 - 1    GAD 7 : Generalized  Anxiety Score 12/14/2020 09/26/2020 01/11/2020 12/14/2019  Nervous, Anxious, on Edge 0 0 2 0  Control/stop worrying 0 0 0 0  Worry too much - different things 0 0 0 0  Trouble relaxing 1 0 0 0  Restless 0 0 0 0  Easily annoyed or irritable 1 0 2 0  Afraid - awful might happen 0 0 0 0  Total GAD 7 Score 2 0 4 0  Anxiety Difficulty - - Not difficult at all Not difficult at all    BP Readings from Last 3 Encounters:  09/26/20 128/80  05/16/20 (!) 176/95  12/14/19 124/82    Physical Exam Constitutional:      Appearance: Normal appearance. She is well-developed.  HENT:  Right Ear: Ear canal and external ear normal. Tympanic membrane is not erythematous or retracted.     Left Ear: External ear normal. Tympanic membrane is not erythematous or retracted.     Ears:     Comments: Mild flesh colored prominence of medial canal    Nose:     Right Sinus: Maxillary sinus tenderness and frontal sinus tenderness present.     Left Sinus: Maxillary sinus tenderness and frontal sinus tenderness present.     Mouth/Throat:     Mouth: No oral lesions.     Pharynx: Uvula midline. Posterior oropharyngeal erythema present. No oropharyngeal exudate.  Cardiovascular:     Rate and Rhythm: Normal rate and regular rhythm. Occasional Extrasystoles are present.    Pulses: Normal pulses.     Heart sounds: Murmur heard.  Systolic (heard best at the upper sternal border) murmur is present with a grade of 1/6.  Pulmonary:     Breath sounds: Normal breath sounds. No wheezing or rales.  Musculoskeletal:     Cervical back: Normal range of motion.     Right lower leg: No edema.     Left lower leg: No edema.  Lymphadenopathy:     Cervical: No cervical adenopathy.  Neurological:     Mental Status: She is alert and oriented to person, place, and time.    Wt Readings from Last 3 Encounters:  12/14/20 174 lb (78.9 kg)  09/26/20 163 lb (73.9 kg)  05/16/20 167 lb 6.4 oz (75.9 kg)    Pulse 60   Temp 98.4 F  (36.9 C) (Oral)   Ht 5\' 2"  (1.575 m)   Wt 174 lb (78.9 kg)   SpO2 96%   BMI 31.83 kg/m   Assessment and Plan: 1. Acute non-recurrent maxillary sinusitis Will treat with zpak and prednisone since she has not tolerate flonase Stop chlorpheniramine which may be too drying - azithromycin (ZITHROMAX Z-PAK) 250 MG tablet; UAD  Dispense: 6 each; Refill: 0  2. Eustachian tube dysfunction, left Suspect this is causing her tinnitus If no resolution of ear fullness/tinnitus will refer to ENT - predniSONE (DELTASONE) 10 MG tablet; Take 1 tablet (10 mg total) by mouth as directed for 6 days. Take 6,5,4,3,2,1 then stop  Dispense: 21 tablet; Refill: 0  3. Heart murmur New faint murmur with hx recent chest discomfort Will get ECHO since she is currently asx - ECHOCARDIOGRAM COMPLETE; Future   Partially dictated using Dragon software. Any errors are unintentional.  Halina Maidens, MD [facility]  12/14/2020

## 2020-12-17 ENCOUNTER — Other Ambulatory Visit: Payer: Self-pay | Admitting: Internal Medicine

## 2021-01-02 ENCOUNTER — Telehealth: Payer: Self-pay | Admitting: Internal Medicine

## 2021-01-02 ENCOUNTER — Other Ambulatory Visit: Payer: Self-pay | Admitting: Internal Medicine

## 2021-01-02 DIAGNOSIS — J0100 Acute maxillary sinusitis, unspecified: Secondary | ICD-10-CM

## 2021-01-02 DIAGNOSIS — M797 Fibromyalgia: Secondary | ICD-10-CM

## 2021-01-02 MED ORDER — HYDROCODONE-ACETAMINOPHEN 5-325 MG PO TABS: 1.0000 | ORAL_TABLET | Freq: Four times a day (QID) | ORAL | 0 refills | Status: DC | PRN

## 2021-01-02 MED ORDER — DOXYCYCLINE HYCLATE 100 MG PO TABS: 100.0000 mg | ORAL_TABLET | Freq: Two times a day (BID) | ORAL | 0 refills | Status: AC

## 2021-01-02 NOTE — Telephone Encounter (Signed)
Patient was seen 12/14/2020 for sinus concerns, medication prescribed offered temporary relief but symptoms are worst now. Patient currently experiencing ringing in ears and head ache. Patient unsure if appointment is necessary, please advise

## 2021-01-02 NOTE — Telephone Encounter (Signed)
Please review. Last office visit 12/14/2020.  KP

## 2021-01-02 NOTE — Telephone Encounter (Signed)
Patient husband Chrissie Noa informed.

## 2021-01-02 NOTE — Telephone Encounter (Signed)
Requested medication (s) are due for refill today:  yes   Requested medication (s) are on the active medication list: yes  Last refill:  11/30/2020  Future visit scheduled: no  Notes to clinic: this refill cannot be delegated    Requested Prescriptions  Pending Prescriptions Disp Refills   HYDROcodone-acetaminophen (NORCO/VICODIN) 5-325 MG tablet 90 tablet 0    Sig: Take 1 tablet by mouth every 6 (six) hours as needed for moderate pain. Fibromyalgia.      Not Delegated - Analgesics:  Opioid Agonist Combinations Failed - 01/02/2021  8:51 AM      Failed - This refill cannot be delegated      Failed - Urine Drug Screen completed in last 360 days      Passed - Valid encounter within last 6 months    Recent Outpatient Visits           2 weeks ago Acute non-recurrent maxillary sinusitis   [facility]   3 months ago [facility]an Hess, MD   11 months ago Depression, major, single episode, in partial remission [facility])   [facility], [facility]   1 year ago [facility], MD   1 year ago Depression, major, single episode, in partial remission [facility])   [facility] Glean [facility], [facility]

## 2021-01-02 NOTE — Telephone Encounter (Signed)
Patient requesting HYDROcodone-acetaminophen (NORCO/VICODIN) 5-325 MG tablet, was advised to always call in prescription to PCP.   [id number] [address] [place] Phone:  [phone]  Fax:  [phone]

## 2021-01-03 ENCOUNTER — Encounter: Payer: Self-pay | Admitting: Internal Medicine

## 2021-01-03 ENCOUNTER — Ambulatory Visit: Admission: RE | Admit: 2021-01-03 | Payer: Medicare PPO | Source: Ambulatory Visit

## 2021-01-08 ENCOUNTER — Other Ambulatory Visit: Payer: Self-pay | Admitting: Internal Medicine

## 2021-01-09 NOTE — Telephone Encounter (Signed)
  Notes to clinic:  Review for refill Last filled on 11/15/2020 for 90 day  No follow up appt scheduled    Requested Prescriptions  Pending Prescriptions Disp Refills   furosemide (LASIX) 20 MG tablet [Pharmacy Med Name: FUROSEMIDE 20 MG Tablet] 90 tablet 0    Sig: TAKE 1 TABLET EVERY DAY (NEED MD APPOINTMENT)      Cardiovascular:  Diuretics - Loop Failed - 01/08/2021  5:50 PM      Failed - Ca in normal range and within 360 days    Calcium  Date Value Ref Range Status  12/06/2014 9.4 8.7 - 10.3 mg/dL Final   Calcium, Total  Date Value Ref Range Status  07/29/2012 9.1 8.5 - 10.1 mg/dL Final          Passed - K in normal range and within 360 days    Potassium  Date Value Ref Range Status  04/19/2020 3.8 3.4 - 5.3 Final  07/29/2012 4.1 3.5 - 5.1 mmol/L Final          Passed - Na in normal range and within 360 days    Sodium  Date Value Ref Range Status  04/19/2020 141 137 - 147 Final  07/29/2012 140 136 - 145 mmol/L Final          Passed - Cr in normal range and within 360 days    Creatinine  Date Value Ref Range Status  04/19/2020 1.0 0.5 - 1.1 Final  07/29/2012 0.93 0.60 - 1.30 mg/dL Final   Creatinine, Ser  Date Value Ref Range Status  12/06/2014 1.00 0.57 - 1.00 mg/dL Final          Passed - Last BP in normal range    BP Readings from Last 1 Encounters:  09/26/20 128/80          Passed - Valid encounter within last 6 months    Recent Outpatient Visits           3 weeks ago Acute non-recurrent maxillary sinusitis   [facility] Gle[facility], [facility]   3 months ago [facility] Glean Hess, MD   12 months ago Depression, major, single episode, in partial remission [facility])   [facility] Glean Hess, MD   1 year ago [facility] Glean Hess, MD   1 year ago Depression, major, single episode, in partial remission [facility])   [facility] Glean Hess, MD

## 2021-01-16 DIAGNOSIS — L570 Actinic keratosis: Secondary | ICD-10-CM | POA: Diagnosis not present

## 2021-01-17 ENCOUNTER — Other Ambulatory Visit: Payer: Self-pay | Admitting: Internal Medicine

## 2021-01-17 NOTE — Telephone Encounter (Signed)
  Notes to clinic: Patient has appt on 06/26/2021 Review for refill Denied on 01/09/2021 refill not appropriate   Requested Prescriptions  Pending Prescriptions Disp Refills   furosemide (LASIX) 20 MG tablet [Pharmacy Med Name: FUROSEMIDE 20 MG Tablet] 90 tablet 0    Sig: TAKE 1 TABLET EVERY DAY (NEED MD APPOINTMENT)      Cardiovascular:  Diuretics - Loop Failed - 01/17/2021  1:26 PM      Failed - Ca in normal range and within 360 days    Calcium  Date Value Ref Range Status  12/06/2014 9.4 8.7 - 10.3 mg/dL Final   Calcium, Total  Date Value Ref Range Status  07/29/2012 9.1 8.5 - 10.1 mg/dL Final          Passed - K in normal range and within 360 days    Potassium  Date Value Ref Range Status  04/19/2020 3.8 3.4 - 5.3 Final  07/29/2012 4.1 3.5 - 5.1 mmol/L Final          Passed - Na in normal range and within 360 days    Sodium  Date Value Ref Range Status  04/19/2020 141 137 - 147 Final  07/29/2012 140 136 - 145 mmol/L Final          Passed - Cr in normal range and within 360 days    Creatinine  Date Value Ref Range Status  04/19/2020 1.0 0.5 - 1.1 Final  07/29/2012 0.93 0.60 - 1.30 mg/dL Final   Creatinine, Ser  Date Value Ref Range Status  12/06/2014 1.00 0.57 - 1.00 mg/dL Final          Passed - Last BP in normal range    BP Readings from Last 1 Encounters:  09/26/20 128/80          Passed - Valid encounter within last 6 months    Recent Outpatient Visits           1 month ago Acute non-recurrent maxillary sinusitis   [facility], [facility]   3 months ago [facility], MD   1 year ago Depression, major, single episode, in partial remission [facility])   [facility] Gle[facility], MD   1 year ago [facility] Glean Hess, MD   1 year ago Depression, major, single episode, in partial remission [facility])   [facility] Glean Hess, MD

## 2021-01-31 ENCOUNTER — Other Ambulatory Visit: Payer: Self-pay | Admitting: Internal Medicine

## 2021-01-31 DIAGNOSIS — M797 Fibromyalgia: Secondary | ICD-10-CM

## 2021-01-31 NOTE — Telephone Encounter (Signed)
Copied from [place] [id number]. Topic: Quick Communication - Rx Refill/Question >> Jan 31, 2021  4:23 PM Leward Quan A wrote: Medication: HYDROcodone-acetaminophen (NORCO/VICODIN) 5-325 MG tablet   Has the patient contacted their pharmacy? Yes.   (Agent: If no, request that the patient contact the pharmacy for the refill.) (Agent: If yes, when and what did the pharmacy advise?)  Preferred Pharmacy (with phone number or street name): [id number] [address] [place] [address] Phone:  [phone]:  [phone]    Agent: Please be advised that RX refills may take up to 3 business days. We ask that you follow-up with your pharmacy.

## 2021-01-31 NOTE — Telephone Encounter (Signed)
Requested medications are due for refill today yes  Requested medications are on the active medication list yes  Last visit 6/10 for other diagnosis, 09/26/20 fibromyalgia  Future visit scheduled 06/26/21  Notes to clinic Not Delegated.

## 2021-02-01 MED ORDER — HYDROCODONE-ACETAMINOPHEN 5-325 MG PO TABS: 1.0000 | ORAL_TABLET | Freq: Four times a day (QID) | ORAL | 0 refills | Status: DC | PRN

## 2021-02-01 NOTE — Telephone Encounter (Signed)
Please review. Last office visit 12/14/20.

## 2021-02-11 ENCOUNTER — Encounter: Payer: Self-pay | Admitting: Internal Medicine

## 2021-03-01 ENCOUNTER — Other Ambulatory Visit: Payer: Self-pay | Admitting: Internal Medicine

## 2021-03-01 DIAGNOSIS — M797 Fibromyalgia: Secondary | ICD-10-CM

## 2021-03-01 MED ORDER — HYDROCODONE-ACETAMINOPHEN 5-325 MG PO TABS: 1.0000 | ORAL_TABLET | Freq: Four times a day (QID) | ORAL | 0 refills | Status: DC | PRN

## 2021-03-01 NOTE — Telephone Encounter (Unsigned)
Copied from [place] [id number]. Topic: Quick Communication - Rx Refill/Question >> Mar 01, 2021 10:17 AM Tessa Lerner A wrote: Medication: HYDROcodone-acetaminophen (NORCO/VICODIN) 5-325 MG tablet [id number]   Has the patient contacted their pharmacy? Yes.   (Agent: If no, request that the patient contact the pharmacy for the refill.) (Agent: If yes, when and what did the pharmacy advise?)  Preferred Pharmacy (with phone number or street name[id number] [place] [address] Phone:  [phone]:  [phone]  Agent: Please be advised that RX refills may take up to 3 business days. We ask that you follow-up with your pharmacy.

## 2021-03-01 NOTE — Telephone Encounter (Signed)
Please review. Last office visit 12/14/2020.  KP

## 2021-03-01 NOTE — Telephone Encounter (Signed)
Please review for refill. Refill not delegated per protocol.  Last refill 02/01/21 #90 Next OV: 06/26/21

## 2021-03-07 NOTE — Telephone Encounter (Signed)
complete

## 2021-03-13 ENCOUNTER — Other Ambulatory Visit: Payer: Self-pay | Admitting: Internal Medicine

## 2021-03-13 DIAGNOSIS — G2581 Restless legs syndrome: Secondary | ICD-10-CM

## 2021-03-13 NOTE — Telephone Encounter (Signed)
Requested Prescriptions  Pending Prescriptions Disp Refills  . propranolol ER (INDERAL LA) 60 MG 24 hr capsule [Pharmacy Med Name: PROPRANOLOL HYDROCHLORIDEER 60 MG Capsule Extended Release 24 Hour] 90 capsule 0    Sig: TAKE 1 CAPSULE EVERY DAY     Cardiovascular:  Beta Blockers Passed - 03/13/2021 11:49 AM      Passed - Last BP in normal range    BP Readings from Last 1 Encounters:  09/26/20 128/80         Passed - Last Heart Rate in normal range    Pulse Readings from Last 1 Encounters:  12/14/20 60         Passed - Valid encounter within last 6 months    Recent Outpatient Visits          2 months ago Acute non-recurrent maxillary sinusitis   [facility], [facility]   5 months ago [facility] Glean Hess, MD   1 year ago Depression, major, single episode, in partial remission [facility])   [facility], [facility]   1 year ago [facility] Glean Hess, MD   1 year ago Depression, major, single episode, in partial remission [facility])   [facility] Glean Hess, MD             . rOPINIRole (REQUIP) 0.5 MG tablet [Pharmacy Med Name: ROPINIROLE HYDROCHLORIDE 0.5 MG Tablet] 630 tablet 1    Sig: TAKE 7 TABLETS AT BEDTIME     Neurology:  Parkinsonian Agents Passed - 03/13/2021 11:49 AM      Passed - Last BP in normal range    BP Readings from Last 1 Encounters:  09/26/20 128/80         Passed - Valid encounter within last 12 months    Recent Outpatient Visits          2 months ago Acute non-recurrent maxillary sinusitis   [facility], [facility]   5 months ago [facility]   1 year ago Depression, major, single episode, in partial remission [facility])   [facility], [facility]   1 year ago [facility] Glean Hess, MD   1 year ago Depression, major, single  episode, in partial remission [facility])   [facility] Glean Hess, MD

## 2021-04-01 ENCOUNTER — Other Ambulatory Visit: Payer: Self-pay | Admitting: Internal Medicine

## 2021-04-01 ENCOUNTER — Telehealth: Payer: Self-pay | Admitting: Internal Medicine

## 2021-04-01 DIAGNOSIS — M797 Fibromyalgia: Secondary | ICD-10-CM

## 2021-04-01 MED ORDER — HYDROCODONE-ACETAMINOPHEN 5-325 MG PO TABS: 1.0000 | ORAL_TABLET | Freq: Four times a day (QID) | ORAL | 0 refills | Status: DC | PRN

## 2021-04-01 NOTE — Telephone Encounter (Signed)
Refill scheduled. Scheduled for 6 months in December.

## 2021-04-01 NOTE — Telephone Encounter (Signed)
Medication Refill - Medication: Hydrocodone   Has the patient contacted their pharmacy? No. Pt states that she always has to call office for this medication. Please advise.  (Agent: If no, request that the patient contact the pharmacy for the refill.) (Agent: If yes, when and what did the pharmacy advise?)  Preferred Pharmacy (with phone number or street name):  [id number] [place], [address]  [place] [address]  Phone: [phone] Fax: [phone]  Hours: Not open 24 hours   Has the patient been seen for an appointment in the last year OR does the patient have an upcoming appointment? Yes.    Agent: Please be advised that RX refills may take up to 3 business days. We ask that you follow-up with your pharmacy.

## 2021-04-29 ENCOUNTER — Other Ambulatory Visit: Payer: Self-pay | Admitting: Internal Medicine

## 2021-04-29 DIAGNOSIS — M797 Fibromyalgia: Secondary | ICD-10-CM

## 2021-04-29 NOTE — Telephone Encounter (Signed)
Requested medications are due for refill today.  yes  Requested medications are on the active medications list.  yes  Last refill. 04/01/2021  Future visit scheduled.   yes  Notes to clinic.  Medication not delegated.

## 2021-04-29 NOTE — Telephone Encounter (Signed)
Medication Refill - Medication: HYDROcodone-acetaminophen (NORCO/VICODIN) 5-325 MG   Has the patient contacted their pharmacy? Yes.   (Agent: If no, request that the patient contact the pharmacy for the refill. If patient does not wish to contact the pharmacy document the reason why and proceed with request.) (Agent: If yes, when and what did the pharmacy advise?)  Preferred Pharmacy (with phone number or street name):  [id number] [place], [place]  [place] [address]  Phone: [phone] Fax: [phone]   Has the patient been seen for an appointment in the last year OR does the patient have an upcoming appointment? Yes.    Agent: Please be advised that RX refills may take up to 3 business days. We ask that you follow-up with your pharmacy.

## 2021-04-30 MED ORDER — HYDROCODONE-ACETAMINOPHEN 5-325 MG PO TABS: 1.0000 | ORAL_TABLET | Freq: Four times a day (QID) | ORAL | 0 refills | Status: DC | PRN

## 2021-05-06 ENCOUNTER — Other Ambulatory Visit: Payer: Self-pay | Admitting: Internal Medicine

## 2021-05-06 NOTE — Telephone Encounter (Signed)
Requested Prescriptions  Pending Prescriptions Disp Refills  . busPIRone (BUSPAR) 10 MG tablet [Pharmacy Med Name: BUSPIRONE HYDROCHLORIDE 10 MG Tablet] 360 tablet 0    Sig: TAKE 1 TABLET FOUR TIMES DAILY     Psychiatry: Anxiolytics/Hypnotics - Non-controlled Passed - 05/06/2021 10:41 AM      Passed - Valid encounter within last 6 months    Recent Outpatient Visits          4 months ago Acute non-recurrent maxillary sinusitis   [facility], [facility]   7 months ago [facility] Gle[facility], [facility]   1 year ago Depression, major, single episode, in partial remission [facility])   [facility] Glean Hess, MD   1 year ago [facility] Glean [facility], MD   1 year ago Depression, major, single episode, in partial remission [facility])   [facility] Glean Hess, MD      Future Appointments            In 1 month Army Melia, Jesse Sans, MD [facility], [place] [facility]

## 2021-05-09 DIAGNOSIS — F411 Generalized anxiety disorder: Secondary | ICD-10-CM | POA: Diagnosis not present

## 2021-05-09 DIAGNOSIS — R413 Other amnesia: Secondary | ICD-10-CM | POA: Diagnosis not present

## 2021-05-09 DIAGNOSIS — G479 Sleep disorder, unspecified: Secondary | ICD-10-CM | POA: Diagnosis not present

## 2021-05-27 ENCOUNTER — Other Ambulatory Visit: Payer: Self-pay | Admitting: Internal Medicine

## 2021-05-27 DIAGNOSIS — M797 Fibromyalgia: Secondary | ICD-10-CM

## 2021-05-27 NOTE — Telephone Encounter (Signed)
Requested medication (s) are due for refill today - yes  Requested medication (s) are on the active medication list -yes  Future visit scheduled -yes  Last refill: 04/30/21 #90  Notes to clinic: Request RF: non delegated Rx, fails drug screen  Requested Prescriptions  Pending Prescriptions Disp Refills   HYDROcodone-acetaminophen (NORCO/VICODIN) 5-325 MG tablet 90 tablet 0    Sig: Take 1 tablet by mouth every 6 (six) hours as needed for moderate pain. Fibromyalgia.     Not Delegated - Analgesics:  Opioid Agonist Combinations Failed - 05/27/2021  9:46 AM      Failed - This refill cannot be delegated      Failed - Urine Drug Screen completed in last 360 days      Passed - Valid encounter within last 6 months    Recent Outpatient Visits           5 months ago Acute non-recurrent maxillary sinusitis   [facility], [facility]   8 months ago [facility] Glean Hess, MD   1 year ago Depression, major, single episode, in partial remission [facility])   [facility] Glean [facility], MD   1 year ago [facility] Glean Hess, MD   1 year ago Depression, major, single episode, in partial remission [facility])   [facility] Glean Hess, MD       Future Appointments             In 3 weeks Glean Hess, MD [facility], [facility]               Requested Prescriptions  Pending Prescriptions Disp Refills   HYDROcodone-acetaminophen (NORCO/VICODIN) 5-325 MG tablet 90 tablet 0    Sig: Take 1 tablet by mouth every 6 (six) hours as needed for moderate pain. Fibromyalgia.     Not Delegated - Analgesics:  Opioid Agonist Combinations Failed - 05/27/2021  9:46 AM      Failed - This refill cannot be delegated      Failed - Urine Drug Screen completed in last 360 days      Passed - Valid encounter within last 6 months    Recent Outpatient Visits           5 months ago Acute  non-recurrent maxillary sinusitis   [facility] Glean Hess, MD   8 months ago [facility] Glean Hess, MD   1 year ago Depression, major, single episode, in partial remission [facility])   [facility] Glean Hess, MD   1 year ago [facility] Glean Hess, MD   1 year ago Depression, major, single episode, in partial remission [facility])   [facility] Glean Hess, MD       Future Appointments             In 3 weeks Army Melia Jesse Sans, MD [facility], [facility]

## 2021-05-27 NOTE — Addendum Note (Signed)
Addended by: Matilde Sprang on: 05/27/2021 09:46 AM   Modules accepted: Orders

## 2021-05-27 NOTE — Telephone Encounter (Signed)
Medication Refill - Medication:  HYDROcodone-acetaminophen (NORCO/VICODIN) 5-325 MG tablet   Has the patient contacted their pharmacy? Yes.    (Agent: If yes, when and what did the pharmacy advise?) Patient was advised by pharmacy to contact PCP because of the medication being controlled.   Preferred Pharmacy (with phone number or street name):  [id number] - [place], [place] Phone:  [phone]  Fax:  [phone]      Has the patient been seen for an appointment in the last year OR does the patient have an upcoming appointment? Yes.    Agent: Please be advised that RX refills may take up to 3 business days. We ask that you follow-up with your pharmacy.

## 2021-05-28 MED ORDER — HYDROCODONE-ACETAMINOPHEN 5-325 MG PO TABS: 1.0000 | ORAL_TABLET | Freq: Four times a day (QID) | ORAL | 0 refills | Status: DC | PRN

## 2021-06-10 ENCOUNTER — Other Ambulatory Visit: Payer: Self-pay | Admitting: Internal Medicine

## 2021-06-11 NOTE — Telephone Encounter (Signed)
Requested medication (s) are due for refill today: yes  Requested medication (s) are on the active medication list: yes  Last refill:  04/09/21 (mail order)  Future visit scheduled: 06/19/21  Notes to clinic:  Failed protocol of labs within 360 days, has upcoming appt, please assess.   Requested Prescriptions  Pending Prescriptions Disp Refills   furosemide (LASIX) 20 MG tablet [Pharmacy Med Name: FUROSEMIDE 20 MG Tablet] 90 tablet 0    Sig: TAKE 1 TABLET EVERY DAY (NEED MD APPOINTMENT)     Cardiovascular:  Diuretics - Loop Failed - 06/10/2021  5:17 PM      Failed - K in normal range and within 360 days    Potassium  Date Value Ref Range Status  04/19/2020 3.8 3.4 - 5.3 Final  07/29/2012 4.1 3.5 - 5.1 mmol/L Final          Failed - Ca in normal range and within 360 days    Calcium  Date Value Ref Range Status  12/06/2014 9.4 8.7 - 10.3 mg/dL Final   Calcium, Total  Date Value Ref Range Status  07/29/2012 9.1 8.5 - 10.1 mg/dL Final          Failed - Na in normal range and within 360 days    Sodium  Date Value Ref Range Status  04/19/2020 141 137 - 147 Final  07/29/2012 140 136 - 145 mmol/L Final          Failed - Cr in normal range and within 360 days    Creatinine  Date Value Ref Range Status  04/19/2020 1.0 0.5 - 1.1 Final  07/29/2012 0.93 0.60 - 1.30 mg/dL Final   Creatinine, Ser  Date Value Ref Range Status  12/06/2014 1.00 0.57 - 1.00 mg/dL Final          Passed - Last BP in normal range    BP Readings from Last 1 Encounters:  09/26/20 128/80          Passed - Valid encounter within last 6 months    Recent Outpatient Visits           5 months ago Acute non-recurrent maxillary [facility]itis   [facility]   8 months ago [place], Laura H, MD   1 year ago Depression, major, single episode, in partial remission [facility])   [facility] Glean Hess, MD   1 year ago  [facility] Glean Hess, MD   1 year ago Depression, major, single episode, in partial remission [facility])   [facility] Glean Hess, MD       Future Appointments             In 1 week Glean Hess, MD [facility]

## 2021-06-12 ENCOUNTER — Encounter: Payer: Self-pay | Admitting: Internal Medicine

## 2021-06-13 ENCOUNTER — Other Ambulatory Visit: Payer: Self-pay

## 2021-06-13 MED ORDER — FUROSEMIDE 20 MG PO TABS: ORAL_TABLET | ORAL | 0 refills | Status: DC

## 2021-06-19 ENCOUNTER — Other Ambulatory Visit: Payer: Self-pay

## 2021-06-19 ENCOUNTER — Ambulatory Visit (INDEPENDENT_AMBULATORY_CARE_PROVIDER_SITE_OTHER): Payer: Medicare PPO | Admitting: Internal Medicine

## 2021-06-19 ENCOUNTER — Encounter: Payer: Self-pay | Admitting: Internal Medicine

## 2021-06-19 VITALS — BP 112/80 | HR 49 | Ht 62.0 in | Wt 178.4 lb

## 2021-06-19 DIAGNOSIS — R600 Localized edema: Secondary | ICD-10-CM

## 2021-06-19 DIAGNOSIS — G3184 Mild cognitive impairment, so stated: Secondary | ICD-10-CM | POA: Diagnosis not present

## 2021-06-19 DIAGNOSIS — K580 Irritable bowel syndrome with diarrhea: Secondary | ICD-10-CM | POA: Diagnosis not present

## 2021-06-19 DIAGNOSIS — G2581 Restless legs syndrome: Secondary | ICD-10-CM

## 2021-06-19 DIAGNOSIS — G43009 Migraine without aura, not intractable, without status migrainosus: Secondary | ICD-10-CM | POA: Diagnosis not present

## 2021-06-19 DIAGNOSIS — R0981 Nasal congestion: Secondary | ICD-10-CM

## 2021-06-19 DIAGNOSIS — M797 Fibromyalgia: Secondary | ICD-10-CM

## 2021-06-19 MED ORDER — MEMANTINE HCL 5 MG PO TABS: 5.0000 mg | ORAL_TABLET | Freq: Two times a day (BID) | ORAL | 1 refills | Status: DC

## 2021-06-19 NOTE — Progress Notes (Signed)
Date:  06/19/2021   Name:  Sandra Mckinney   DOB:  09-Oct-1945   MRN:  [id number]   Chief Complaint: Fibromyalgia  Diarrhea  This is a chronic problem. The problem occurs 2 to 4 times per day. The problem has been unchanged. The stool consistency is described as Watery. The patient states that diarrhea does not awaken her from sleep. Associated symptoms include myalgias. Pertinent negatives include no abdominal pain, chills, coughing, fever or headaches. Exacerbated by: eating just about anything but especially spices, vinegar, condiments. She has tried anti-motility drug for the symptoms. The treatment provided moderate relief.  Migraine  This is a recurrent problem. The problem occurs monthly. The pain quality is similar to prior headaches. Associated symptoms include sinus pressure. Pertinent negatives include no abdominal pain, coughing, dizziness, fever or sore throat. She has tried beta blockers for the symptoms. The treatment provided significant relief.  Fibromyalgia - Fatigue and muscle pain unchanged.  Taking Vicodin three times a day and functions fairly well.  Buspar has been helpful for mood and anxiety.  Gabapentin is being tolerated without daytime somnolence. Sleep is disturbed - tends to stay up late and sleep more during the day.  Not exercising regularly.  RLS - taking Requip 3.5 mg daily at bedtime with good control.  Edema - mild lower leg edema, non pitting and generally unchanged.  She is taking lasix daily.  Limiting dietary sodium.  No exercise, no compression stockings.  Lab Results  Component Value Date   NA 141 04/19/2020   K 3.8 04/19/2020   CO2 6 (A) 04/19/2020   GLUCOSE 88 12/06/2014   BUN 14 04/19/2020   CREATININE 1.0 04/19/2020   CALCIUM 9.4 12/06/2014   GFRNONAA 56 04/19/2020   Lab Results  Component Value Date   CHOL 208 (H) 04/16/2016   HDL 92 04/16/2016   LDLCALC 87 04/16/2016   TRIG 145 04/16/2016   CHOLHDL 2.3 04/16/2016   Lab  Results  Component Value Date   TSH 0.43 02/08/2020   Lab Results  Component Value Date   HGBA1C 5.8 02/08/2020   Lab Results  Component Value Date   WBC 8.7 04/19/2020   HGB 12.7 04/19/2020   HCT 39 04/19/2020   MCV 88 07/29/2012   PLT 213 04/19/2020   Lab Results  Component Value Date   ALT 89 (A) 04/19/2020   AST 29 04/19/2020   ALKPHOS 82 07/06/2019   Lab Results  Component Value Date   VD25OH 30 02/08/2020     Review of Systems  Constitutional:  Negative for chills, fatigue and fever.  HENT:  Positive for congestion and sinus pressure. Negative for postnasal drip and sore throat.   Respiratory:  Negative for cough, chest tightness, shortness of breath and wheezing.   Cardiovascular:  Positive for leg swelling. Negative for chest pain and palpitations.  Gastrointestinal:  Positive for diarrhea. Negative for abdominal pain.  Genitourinary:  Negative for dysuria.  Musculoskeletal:  Positive for myalgias.  Skin:  Negative for rash.  Allergic/Immunologic: Negative for environmental allergies.  Neurological:  Negative for dizziness and headaches.  Psychiatric/Behavioral:  Positive for decreased concentration. Negative for hallucinations and sleep disturbance. The patient is not nervous/anxious.    Patient Active Problem List   Diagnosis Date Noted   Mild cognitive impairment 02/08/2020   Narcotic use agreement exists 12/14/2019   Acquired absence of both breasts 06/22/2019   Fibromyalgia 07/29/2016   Hyperlipidemia 05/20/2016   Age-related osteoporosis without current pathological fracture  04/16/2016   Anemia 12/12/2015   Neoplasm of left breast, primary tumor staging category Tis: ductal carcinoma in situ (DCIS) 01/03/2015   History of left breast cancer 01/03/2015   Neoplasm of right breast, primary tumor staging category Tis: ductal carcinoma in situ (DCIS) 12/27/2014   Anxiety disorder 10/11/2014   Plantar fasciitis 10/11/2014   Local edema 10/11/2014    Depression, major, single episode, in partial remission ([place]) 10/11/2014   Hot flash, menopausal 10/11/2014   Migraine without aura and responsive to treatment 10/11/2014   Idiopathic insomnia 10/11/2014   Psoriasis 10/11/2014   Restless leg 10/11/2014   Avitaminosis D 10/11/2014    Allergies  Allergen Reactions   Cephalexin Hives   Letrozole Swelling   Raloxifene Hives   Selective Estrogen Receptor Modulators Hives   Citalopram Other (See Comments)    Dry mouth    Cephalosporins    Duloxetine Hcl    Exemestane Other (See Comments)   Lyrica [Pregabalin]    Oxycodone    Penicillins    Prilosec  [Omeprazole]    Sulfa Antibiotics     Past Surgical History:  Procedure Laterality Date   ABDOMINAL HYSTERECTOMY  1986   uterine fibroids and endometriosis   BREAST BIOPSY Right 06/01/12   [place] bx/clip-neg   BREAST IMPLANT REMOVAL Bilateral 10/19/2019   BREAST RECONSTRUCTION Bilateral 2017   BREAST REDUCTION SURGERY  10/19/2019   all breast reconstructions removed   CARPAL TUNNEL RELEASE Right 2014   EYE SURGERY Bilateral 2009   catarcts   MASTECTOMY, RADICAL Bilateral 2016   REDUCTION MAMMAPLASTY     2001   THUMB ARTHROSCOPY Right 2014    Social History   Tobacco Use   Smoking status: Former    Packs/day: 0.75    Years: 10.00    Pack years: 7.50    Types: Cigarettes    Quit date: 04/13/1974    Years since quitting: 47.2   Smokeless tobacco: Never   Tobacco comments:    smoking cessation materials not required  Vaping Use   Vaping Use: Never used  Substance Use Topics   Alcohol use: No    Alcohol/week: 0.0 standard drinks   Drug use: No     Medication list has been reviewed and updated.  Current Meds  Medication Sig   aspirin-acetaminophen-caffeine (EXCEDRIN MIGRAINE) 250-250-65 MG tablet Take by mouth every 6 (six) hours as needed for headache.   busPIRone (BUSPAR) 10 MG tablet TAKE 1 TABLET FOUR TIMES DAILY   Calcium-Magnesium-Vitamin D 600-40-500  MG-MG-UNIT TB24 Take 1 tablet by mouth daily.   chlorpheniramine (CHLOR-TRIMETON) 4 MG tablet Take 4 mg by mouth daily.   cholecalciferol (VITAMIN D) 1000 UNITS tablet Take 1 tablet by mouth daily.   donepezil (ARICEPT) 10 MG tablet Take by mouth.   ferrous sulfate 325 (65 FE) MG tablet Take 325 mg by mouth daily with breakfast.   furosemide (LASIX) 20 MG tablet TAKE 1 TABLET EVERY DAY (NEED MD APPOINTMENT)   gabapentin (NEURONTIN) 100 MG capsule TAKE 1 CAPSULE THREE TIMES DAILY (Patient taking differently: Nerve Pain, Fibromyalgia)   HYDROcodone-acetaminophen (NORCO/VICODIN) 5-325 MG tablet Take 1 tablet by mouth every 6 (six) hours as needed for moderate pain. Fibromyalgia.   loperamide (IMODIUM A-D) 2 MG tablet Take 2 mg by mouth 4 (four) times daily as needed for diarrhea or loose stools.   magnesium oxide (MAG-OX) 400 MG tablet Take 400 mg by mouth 2 (two) times daily.   memantine (NAMENDA) 5 MG tablet Take 1 tablet (  5 mg total) by mouth 2 (two) times daily.   MULTIPLE VITAMINS-MINERALS PO Take 1 tablet by mouth daily.   propranolol ER (INDERAL LA) 60 MG 24 hr capsule TAKE 1 CAPSULE EVERY DAY   rOPINIRole (REQUIP) 0.5 MG tablet TAKE 7 TABLETS AT BEDTIME    PHQ 2/9 Scores 06/19/2021 12/14/2020 09/26/2020 06/25/2020  PHQ - 2 Score 0 0 1 0  PHQ- 9 Score 3 8 1  -    GAD 7 : Generalized Anxiety Score 06/19/2021 12/14/2020 09/26/2020 01/11/2020  Nervous, Anxious, on Edge 0 0 0 2  Control/stop worrying 0 0 0 0  Worry too much - different things 0 0 0 0  Trouble relaxing 1 1 0 0  Restless 0 0 0 0  Easily annoyed or irritable 1 1 0 2  Afraid - awful might happen 0 0 0 0  Total GAD 7 Score 2 2 0 4  Anxiety Difficulty Not difficult at all - - Not difficult at all    BP Readings from Last 3 Encounters:  06/19/21 112/80  09/26/20 128/80  05/16/20 (!) 176/95    Physical Exam Vitals and nursing note reviewed.  Constitutional:      General: She is not in acute distress.    Appearance: Normal  appearance. She is well-developed.  HENT:     Head: Normocephalic and atraumatic.  Neck:     Vascular: No carotid bruit.  Cardiovascular:     Rate and Rhythm: Normal rate and regular rhythm.     Pulses: Normal pulses.          Dorsalis pedis pulses are 2+ on the right side and 2+ on the left side.     Heart sounds: Normal heart sounds.  Pulmonary:     Effort: Pulmonary effort is normal. No respiratory distress.     Breath sounds: No wheezing or rhonchi.  Musculoskeletal:     Cervical back: Normal range of motion.     Right lower leg: No edema.     Left lower leg: No edema.  Lymphadenopathy:     Cervical: No cervical adenopathy.  Skin:    General: Skin is warm and dry.     Capillary Refill: Capillary refill takes less than 2 seconds.     Findings: No rash.  Neurological:     Mental Status: She is alert and oriented to person, place, and time.     Sensory: Sensation is intact.     Motor: Motor function is intact.  Psychiatric:        Attention and Perception: Attention normal.        Mood and Affect: Mood normal.        Speech: Speech normal.        Behavior: Behavior normal.    Wt Readings from Last 3 Encounters:  06/19/21 178 lb 6.4 oz (80.9 kg)  12/14/20 174 lb (78.9 kg)  09/26/20 163 lb (73.9 kg)    BP 112/80    Pulse (!) 49    Ht 5\' 2"  (1.575 m)    Wt 178 lb 6.4 oz (80.9 kg)    SpO2 97%    BMI 32.63 kg/m   Assessment and Plan: 1. Fibromyalgia Stable chronic sx treated with gabapentin and hydrocodone.  2. Local edema Stable mild edema. Continue lasix daily. Recommend leg elevation consistently.  3. Restless leg Controlled on Requip.  4. MCI (mild cognitive impairment) Stable sx per husband - initially improved with Aricept but now leveled out. Seen by neurology PA -  never the MD.  May seek another opinion after the first of the year at [facility]. Continue Aricept. Add Namenda. - memantine (NAMENDA) 5 MG tablet; Take 1 tablet (5 mg total) by mouth 2 (two) times  daily.  Dispense: 180 tablet; Refill: 1  5. Nasal congestion Recommend Flonase or Nasacort daily.  6. Irritable bowel syndrome with diarrhea Has been unable to complete a colonoscopy for thorough evaluation. Can continue to use imodium 2 tabs before each meal.  7. Migraine without aura and responsive to treatment Continue daily beta blocker   Partially dictated using Editor, commissioning. Any errors are unintentional.  Halina Maidens, MD [facility]  06/19/2021

## 2021-06-25 ENCOUNTER — Other Ambulatory Visit: Payer: Self-pay | Admitting: Internal Medicine

## 2021-06-25 DIAGNOSIS — M797 Fibromyalgia: Secondary | ICD-10-CM

## 2021-06-25 NOTE — Telephone Encounter (Signed)
Medication Refill - Medication: HYDROcodone-acetaminophen (NORCO/VICODIN) 5-325 MG tablet  Has the patient contacted their pharmacy? Yes.    (Agent: If yes, when and what did the pharmacy advise?) Contact PCP   Preferred Pharmacy (with phone number or street name):  [id number] [place], [place] Phone:  [phone]  Fax:  [phone]       Has the patient been seen for an appointment in the last year OR does the patient have an upcoming appointment? Yes.    Agent: Please be advised that RX refills may take up to 3 business days. We ask that you follow-up with your pharmacy.

## 2021-06-25 NOTE — Telephone Encounter (Signed)
Requested medications are due for refill today.  yes  Requested medications are on the active medications list.  yes  Last refill. 05/28/2021  Future visit scheduled.   yes  Notes to clinic.  Medication not delegated.    Requested Prescriptions  Pending Prescriptions Disp Refills   HYDROcodone-acetaminophen (NORCO/VICODIN) 5-325 MG tablet 90 tablet 0    Sig: Take 1 tablet by mouth every 6 (six) hours as needed for moderate pain. Fibromyalgia.     Not Delegated - Analgesics:  Opioid Agonist Combinations Failed - 06/25/2021  1:39 PM      Failed - This refill cannot be delegated      Failed - Urine Drug Screen completed in last 360 days      Passed - Valid encounter within last 6 months    Recent Outpatient Visits           6 days ago [place], MD   6 months ago Acute non-recurrent maxillary sinusitis   [facility] Glean [facility], [facility]   9 months ago [facility] Glean [facility], MD   1 year ago Depression, major, single episode, in partial remission [facility])   [facility] Glean Hess, MD   1 year ago [facility] Glean [facility], MD       Future Appointments             In 5 months [place] Melia Jesse Sans, MD [facility]

## 2021-06-26 ENCOUNTER — Ambulatory Visit (INDEPENDENT_AMBULATORY_CARE_PROVIDER_SITE_OTHER): Payer: Medicare PPO

## 2021-06-26 DIAGNOSIS — Z0000 Encounter for general adult medical examination without abnormal findings: Secondary | ICD-10-CM

## 2021-06-26 MED ORDER — HYDROCODONE-ACETAMINOPHEN 5-325 MG PO TABS: 1.0000 | ORAL_TABLET | Freq: Four times a day (QID) | ORAL | 0 refills | Status: DC | PRN

## 2021-06-26 NOTE — Progress Notes (Signed)
Subjective:   Sandra Mckinney is a 75 y.o. female who presents for Medicare Annual (Subsequent) preventive examination.  Virtual Visit via Telephone Note  I connected with  [name] on 06/26/21 at  8:40 AM EST by telephone and verified that I am speaking with the correct person using two identifiers.  Location: Patient: home Provider: [facility] Persons participating in the virtual visit: patient & husband [name] [name] Health Advisor   I discussed the limitations, risks, security and privacy concerns of performing an evaluation and management service by telephone and the availability of in person appointments. The patient expressed understanding and agreed to proceed.  Interactive audio and video telecommunications were attempted between this nurse and patient, however failed, due to patient having technical difficulties OR patient did not have access to video capability.  We continued and completed visit with audio only.  Some vital signs may be absent or patient reported.   [name], LPN   Review of Systems     Cardiac Risk Factors include: advanced age (>59men, >48 women);obesity (BMI >30kg/m2)     Objective:    Today's Vitals   06/26/21 0843  PainSc: 4    There is no height or weight on file to calculate BMI.  Advanced Directives 06/26/2021 06/25/2020 04/18/2019 04/14/2018 04/13/2017 04/16/2016 10/10/2015  Does Patient Have a Medical Advance Directive? Yes Yes Yes Yes Yes No;Yes Yes  Type of Paramedic of [place];Living will [place];Living will Living will;Healthcare Power of [place];Living will [place];Living will - Living will  Copy of [place] in Chart? No - copy requested No - copy requested No - copy requested No - copy requested No - copy requested - -    Current Medications (verified) Outpatient Encounter Medications as of  06/26/2021  Medication Sig   aspirin-acetaminophen-caffeine (EXCEDRIN MIGRAINE) 250-250-65 MG tablet Take by mouth every 6 (six) hours as needed for headache.   busPIRone (BUSPAR) 10 MG tablet TAKE 1 TABLET FOUR TIMES DAILY   Calcium-Magnesium-Vitamin D 600-40-500 MG-MG-UNIT TB24 Take 1 tablet by mouth daily.   chlorpheniramine (CHLOR-TRIMETON) 4 MG tablet Take 4 mg by mouth daily.   cholecalciferol (VITAMIN D) 1000 UNITS tablet Take 1 tablet by mouth daily.   donepezil (ARICEPT) 10 MG tablet Take by mouth.   ferrous sulfate 325 (65 FE) MG tablet Take 325 mg by mouth daily with breakfast.   furosemide (LASIX) 20 MG tablet TAKE 1 TABLET EVERY DAY (NEED MD APPOINTMENT)   gabapentin (NEURONTIN) 100 MG capsule TAKE 1 CAPSULE THREE TIMES DAILY (Patient taking differently: Nerve Pain, Fibromyalgia)   HYDROcodone-acetaminophen (NORCO/VICODIN) 5-325 MG tablet Take 1 tablet by mouth every 6 (six) hours as needed for moderate pain. Fibromyalgia.   loperamide (IMODIUM A-D) 2 MG tablet Take 2 mg by mouth 4 (four) times daily as needed for diarrhea or loose stools.   magnesium oxide (MAG-OX) 400 MG tablet Take 400 mg by mouth 2 (two) times daily.   memantine (NAMENDA) 5 MG tablet Take 1 tablet (5 mg total) by mouth 2 (two) times daily.   MULTIPLE VITAMINS-MINERALS PO Take 1 tablet by mouth daily.   propranolol ER (INDERAL LA) 60 MG 24 hr capsule TAKE 1 CAPSULE EVERY DAY   rOPINIRole (REQUIP) 0.5 MG tablet TAKE 7 TABLETS AT BEDTIME   [DISCONTINUED] HYDROcodone-acetaminophen (NORCO/VICODIN) 5-325 MG tablet Take 1 tablet by mouth every 6 (six) hours as needed for moderate pain. Fibromyalgia.   No  facility-administered encounter medications on file as of 06/26/2021.    Allergies (verified) Cephalexin, Letrozole, Raloxifene, Selective estrogen receptor modulators, Citalopram, Cephalosporins, Duloxetine hcl, Exemestane, Lyrica [pregabalin], Oxycodone, Penicillins, Prilosec  [omeprazole], and Sulfa antibiotics    History: Past Medical History:  Diagnosis Date   Breast cancer ([place]) 6568   Complication of anesthesia    2007.  Med injected into vein instead of muscle.  Heart stopped.  3 "shocks" to restart.  3 grand mal seizures after heart restarted.   Fibromyalgia    Hyperlipidemia    Major depressive disorder    Migraine headache    only 3-4 per year since starting propranolol   Restless leg syndrome    Seizures ([place]) 2007   Med was injected into vein instead of muscle.  Heart stopped.  3 shocks to restart heart.  3 grand mal seizures after heart restarted.   Vitamin D deficiency    Past Surgical History:  Procedure Laterality Date   ABDOMINAL HYSTERECTOMY  1986   uterine fibroids and endometriosis   BREAST BIOPSY Right 06/01/12   [place] bx/clip-neg   BREAST IMPLANT REMOVAL Bilateral 10/19/2019   BREAST RECONSTRUCTION Bilateral 2017   BREAST REDUCTION SURGERY  10/19/2019   all breast reconstructions removed   CARPAL TUNNEL RELEASE Right 2014   EYE SURGERY Bilateral 2009   catarcts   MASTECTOMY, RADICAL Bilateral 2016   REDUCTION MAMMAPLASTY     2001   THUMB ARTHROSCOPY Right 2014   Family History  Problem Relation Age of Onset   Dementia Mother    Hypertension Mother    Heart disease Father 33   Social History   Socioeconomic History   Marital status: Married    Spouse name: Not on file   Number of children: 2   Years of education: some college   Highest education level: 12th grade  Occupational History   Occupation: Retired  Tobacco Use   Smoking status: Former    Packs/day: 0.75    Years: 10.00    Pack years: 7.50    Types: Cigarettes    Quit date: 04/13/1974    Years since quitting: 47.2   Smokeless tobacco: Never   Tobacco comments:    smoking cessation materials not required  Vaping Use   Vaping Use: Never used  Substance and Sexual Activity   Alcohol use: No    Alcohol/week: 0.0 standard drinks   Drug use: No   Sexual activity: Not Currently  Other  Topics Concern   Not on file  Social History Narrative   Not on file   Social Determinants of Health   Financial Resource Strain: Low Risk    Difficulty of Paying Living Expenses: Not hard at all  Food Insecurity: No Food Insecurity   Worried About Charity fundraiser in the Last Year: Never true   Ran Out of Food in the Last Year: Never true  Transportation Needs: No Transportation Needs   Lack of Transportation (Medical): No   Lack of Transportation (Non-Medical): No  Physical Activity: Inactive   Days of Exercise per Week: 0 days   Minutes of Exercise per Session: 0 min  Stress: No Stress Concern Present   Feeling of Stress : Only a little  Social Connections: Moderately Isolated   Frequency of Communication with Friends and Family: More than three times a week   Frequency of Social Gatherings with Friends and Family: Once a week   Attends Religious Services: Never   Marine scientist or Organizations:  No   Attends Archivist Meetings: Never   Marital Status: Married    Tobacco Counseling Counseling given: Not Answered Tobacco comments: smoking cessation materials not required   Clinical Intake:  Pre-visit preparation completed: Yes  Pain : 0-10 Pain Score: 4  Pain Type: Chronic pain Pain Location: Back (fibromyalgia) Pain Orientation: Mid, Upper Pain Descriptors / Indicators: Aching, Constant Pain Onset: More than a month ago Pain Frequency: Constant     Nutritional Status: BMI > 30  Obese Nutritional Risks: None Diabetes: No  How often do you need to have someone help you when you read instructions, pamphlets, or other written materials from your doctor or pharmacy?: 1 - Never    Interpreter Needed?: No  Information entered by :: [name] LPN   Activities of Daily Living In your present state of health, do you have any difficulty performing the following activities: 06/26/2021 12/14/2020  Hearing? N Y  Vision? N N  Difficulty  concentrating or making decisions? N N  Walking or climbing stairs? N N  Dressing or bathing? N N  Doing errands, shopping? N N  Preparing Food and eating ? N -  Using the Toilet? N -  In the past six months, have you accidently leaked urine? N -  Do you have problems with loss of bowel control? N -  Managing your Medications? N -  Managing your Finances? N -  Housekeeping or managing your Housekeeping? N -  Some recent data might be hidden    Patient Care Team: Glean Hess, MD as PCP - General (Family Medicine) Anabel Bene, MD as Referring Physician (Neurology)  Indicate any recent Medical Services you may have received from other than [facility] providers in the past year (date may be approximate).     Assessment:   This is a routine wellness examination for [facility].  Hearing/Vision screen Hearing Screening - Comments:: Pt denies hearing difficulty but c/o ringing in ears due to sinus issues  Vision Screening - Comments:: Annual vision screenings at [facility]; due for exam   Dietary issues and exercise activities discussed: Current Exercise Habits: The patient does not participate in regular exercise at present (recently got an exercise bike), Exercise limited by: neurologic condition(s)   Goals Addressed             This Visit's Progress    Increase water intake   On track    Recommend to cut out sodas and replace with a minimum of 6-8 glasses per day.       Depression Screen PHQ 2/9 Scores 06/26/2021 06/19/2021 12/14/2020 09/26/2020 06/25/2020 01/11/2020 12/14/2019  PHQ - 2 Score 0 0 0 1 0 0 0  PHQ- 9 Score 3 3 8 1  - 1 0    Fall Risk Fall Risk  06/26/2021 06/19/2021 12/14/2020 09/26/2020 06/25/2020  Falls in the past year? 0 0 0 0 0  Number falls in past yr: 0 0 - - 0  Injury with Fall? 0 0 - - 0  Risk for fall due to : No Fall Risks No Fall Risks - - No Fall Risks  Follow up Falls prevention discussed Falls evaluation completed Falls evaluation completed  Falls evaluation completed Falls prevention discussed    FALL RISK PREVENTION PERTAINING TO THE HOME:  Any stairs in or around the home? Yes  If so, are there any without handrails? Yes - outside steps Home free of loose throw rugs in walkways, pet beds, electrical cords, etc? Yes  Adequate lighting in your home to reduce risk of falls? Yes   ASSISTIVE DEVICES UTILIZED TO PREVENT FALLS:  Life alert? No  Use of a cane, walker or w/c? No  Grab bars in the bathroom? No  Shower chair or bench in shower? Yes  Elevated toilet seat or a handicapped toilet? Yes   TIMED UP AND GO:  Was the test performed? No . Telephonic visit.   Cognitive Function: Cognitive status assessed by direct observation. Patient has current diagnosis of cognitive impairment. Patient is followed by neurology for ongoing assessment.        6CIT Screen 01/11/2020 04/18/2019 04/14/2018 04/16/2016  What Year? 0 points 0 points 0 points 0 points  What month? 0 points 0 points 0 points 0 points  What time? 0 points 0 points 0 points 0 points  Count back from 20 0 points 0 points 0 points 0 points  Months in reverse 0 points 0 points 0 points 0 points  Repeat phrase 0 points 0 points 0 points 0 points  Total Score 0 0 0 0    Immunizations Immunization History  Administered Date(s) Administered   Influenza, High Dose Seasonal PF 03/05/2017, 03/11/2018, 04/11/2019, 03/18/2020, 03/26/2021   Influenza-Unspecified 03/07/2014, 02/03/2017, 04/11/2019   PFIZER(Purple Top)SARS-COV-2 Vaccination 08/19/2019, 09/09/2019, 04/05/2020, 10/22/2020   Pneumococcal Conjugate-13 03/14/2014, 09/17/2015   Pneumococcal Polysaccharide-23 04/18/2002, 11/07/2010, 04/14/2018   Tdap 11/24/2011   Zoster, Live 07/07/2008, 10/09/2014    TDAP status: Up to date  Flu Vaccine status: Up to date  Pneumococcal vaccine status: Up to date  Covid-19 vaccine status: Completed vaccines  Qualifies for Shingles Vaccine? Yes   Zostavax completed  Yes   Shingrix Completed?: No.    Education has been provided regarding the importance of this vaccine. Patient has been advised to call insurance company to determine out of pocket expense if they have not yet received this vaccine. Advised may also receive vaccine at local pharmacy or Health Dept. Verbalized acceptance and understanding.  Screening Tests Health Maintenance  Topic Date Due   Zoster Vaccines- Shingrix (1 of 2) Never done   COVID-19 Vaccine (5 - Booster for Pfizer series) 12/17/2020   TETANUS/TDAP  11/23/2021   Fecal DNA (Cologuard)  04/03/2023   Pneumonia Vaccine 58+ Years old  Completed   INFLUENZA VACCINE  Completed   DEXA SCAN  Completed   Hepatitis C Screening  Addressed   HPV VACCINES  Aged Out   COLONOSCOPY (Pts 45-53yrs Insurance coverage will need to be confirmed)  [place] Maintenance Due  Topic Date Due   Zoster Vaccines- Shingrix (1 of 2) Never done   COVID-19 Vaccine (5 - Booster for [place] series) 12/17/2020    Colorectal cancer screening: Type of screening: Cologuard. Completed 04/02/20. Repeat every 3 years. Pt unable to tolerate prep for colonoscopy.   Mammogram status: No longer required due to bilateral mastectomy.  Bone Density status: Completed 07/17/18. Results reflect: Bone density results: OSTEOPENIA. Repeat every 2 years. Pt plans to discuss with Dr. Army Melia at next visit for repeat screening recommendations.   Lung Cancer Screening: (Low Dose CT Chest recommended if Age 34-80 years, 30 pack-year currently smoking OR have quit w/in 15years.) does not qualify.   Additional Screening:  Hepatitis C Screening: does qualify; Completed 01/09/15  Vision Screening: Recommended annual ophthalmology exams for early detection of glaucoma and other disorders of the eye. Is the patient up to date with their annual eye exam?  No  Who  is the provider or what is the name of the office in which the patient attends annual  eye exams? [facility].   Dental Screening: Recommended annual dental exams for proper oral hygiene  Community Resource Referral / Chronic Care Management: CRR required this visit?  No   CCM required this visit?  No      Plan:     I have personally reviewed and noted the following in the patients chart:   Medical and social history Use of alcohol, tobacco or illicit drugs  Current medications and supplements including opioid prescriptions.  Functional ability and status Nutritional status Physical activity Advanced directives List of other physicians Hospitalizations, surgeries, and ER visits in previous 12 months Vitals Screenings to include cognitive, depression, and falls Referrals and appointments  In addition, I have reviewed and discussed with patient certain preventive protocols, quality metrics, and best practice recommendations. A written personalized care plan for preventive services as well as general preventive health recommendations were provided to patient.     [name], LPN   [id number]   Nurse Notes: none

## 2021-06-26 NOTE — Telephone Encounter (Signed)
Please review. Last office visit 06/19/2021.  KP

## 2021-06-26 NOTE — Patient Instructions (Signed)
Sandra Mckinney , Thank you for taking time to come for your Medicare Wellness Visit. I appreciate your ongoing commitment to your health goals. Please review the following plan we discussed and let me know if I can assist you in the future.   Screening recommendations/referrals: Colonoscopy: Cologuard done 04/02/20 Bone Density: done 07/17/18 Recommended yearly ophthalmology/optometry visit for glaucoma screening and checkup Recommended yearly dental visit for hygiene and checkup  Vaccinations: Influenza vaccine: done 03/26/21 Pneumococcal vaccine: done 04/14/18 Tdap vaccine: done 11/24/11 Shingles vaccine: Shingrix discussed. Please contact your pharmacy for coverage information.  Covid-19:done 08/19/19, 09/09/19, 04/05/20 & 10/22/20  Advanced directives: Please bring a copy of your health care power of attorney and living will to the office at your convenience.   Conditions/risks identified: Recommend increasing physical activity as tolerated.   Next appointment: Follow up in one year for your annual wellness visit    Preventive Care 65 Years and Older, Female Preventive care refers to lifestyle choices and visits with your health care provider that can promote health and wellness. What does preventive care include? A yearly physical exam. This is also called an annual well check. Dental exams once or twice a year. Routine eye exams. Ask your health care provider how often you should have your eyes checked. Personal lifestyle choices, including: Daily care of your teeth and gums. Regular physical activity. Eating a healthy diet. Avoiding tobacco and drug use. Limiting alcohol use. Practicing safe sex. Taking low-dose aspirin every day. Taking vitamin and mineral supplements as recommended by your health care provider. What happens during an annual well check? The services and screenings done by your health care provider during your annual well check will depend on your age, overall  health, lifestyle risk factors, and family history of disease. Counseling  Your health care provider may ask you questions about your: Alcohol use. Tobacco use. Drug use. Emotional well-being. Home and relationship well-being. Sexual activity. Eating habits. History of falls. Memory and ability to understand (cognition). Work and work Statistician. Reproductive health. Screening  You may have the following tests or measurements: Height, weight, and BMI. Blood pressure. Lipid and cholesterol levels. These may be checked every 5 years, or more frequently if you are over 6 years old. Skin check. Lung cancer screening. You may have this screening every year starting at age 55 if you have a 30-pack-year history of smoking and currently smoke or have quit within the past 15 years. Fecal occult blood test (FOBT) of the stool. You may have this test every year starting at age 49. Flexible sigmoidoscopy or colonoscopy. You may have a sigmoidoscopy every 5 years or a colonoscopy every 10 years starting at age 58. Hepatitis C blood test. Hepatitis B blood test. Sexually transmitted disease (STD) testing. Diabetes screening. This is done by checking your blood sugar (glucose) after you have not eaten for a while (fasting). You may have this done every 1-3 years. Bone density scan. This is done to screen for osteoporosis. You may have this done starting at age 58. Mammogram. This may be done every 1-2 years. Talk to your health care provider about how often you should have regular mammograms. Talk with your health care provider about your test results, treatment options, and if necessary, the need for more tests. Vaccines  Your health care provider may recommend certain vaccines, such as: Influenza vaccine. This is recommended every year. Tetanus, diphtheria, and acellular pertussis (Tdap, Td) vaccine. You may need a Td booster every 10 years. Zoster vaccine.  You may need this after age  36. Pneumococcal 13-valent conjugate (PCV13) vaccine. One dose is recommended after age 33. Pneumococcal polysaccharide (PPSV23) vaccine. One dose is recommended after age 57. Talk to your health care provider about which screenings and vaccines you need and how often you need them. This information is not intended to replace advice given to you by your health care provider. Make sure you discuss any questions you have with your health care provider. Document Released: 07/20/2015 Document Revised: 03/12/2016 Document Reviewed: 04/24/2015 Elsevier Interactive Patient Education  2017 [employer] Prevention in the Home Falls can cause injuries. They can happen to people of all ages. There are many things you can do to make your home safe and to help prevent falls. What can I do on the outside of my home? Regularly fix the edges of walkways and driveways and fix any cracks. Remove anything that might make you trip as you walk through a door, such as a raised step or threshold. Trim any bushes or trees on the path to your home. Use bright outdoor lighting. Clear any walking paths of anything that might make someone trip, such as rocks or tools. Regularly check to see if handrails are loose or broken. Make sure that both sides of any steps have handrails. Any raised decks and porches should have guardrails on the edges. Have any leaves, snow, or ice cleared regularly. Use sand or salt on walking paths during winter. Clean up any spills in your garage right away. This includes oil or grease spills. What can I do in the bathroom? Use night lights. Install grab bars by the toilet and in the tub and shower. Do not use towel bars as grab bars. Use non-skid mats or decals in the tub or shower. If you need to sit down in the shower, use a plastic, non-slip stool. Keep the floor dry. Clean up any water that spills on the floor as soon as it happens. Remove soap buildup in the tub or shower  regularly. Attach bath mats securely with double-sided non-slip rug tape. Do not have throw rugs and other things on the floor that can make you trip. What can I do in the bedroom? Use night lights. Make sure that you have a light by your bed that is easy to reach. Do not use any sheets or blankets that are too big for your bed. They should not hang down onto the floor. Have a firm chair that has side arms. You can use this for support while you get dressed. Do not have throw rugs and other things on the floor that can make you trip. What can I do in the kitchen? Clean up any spills right away. Avoid walking on wet floors. Keep items that you use a lot in easy-to-reach places. If you need to reach something above you, use a strong step stool that has a grab bar. Keep electrical cords out of the way. Do not use floor polish or wax that makes floors slippery. If you must use wax, use non-skid floor wax. Do not have throw rugs and other things on the floor that can make you trip. What can I do with my stairs? Do not leave any items on the stairs. Make sure that there are handrails on both sides of the stairs and use them. Fix handrails that are broken or loose. Make sure that handrails are as long as the stairways. Check any carpeting to make sure that it is  firmly attached to the stairs. Fix any carpet that is loose or worn. Avoid having throw rugs at the top or bottom of the stairs. If you do have throw rugs, attach them to the floor with carpet tape. Make sure that you have a light switch at the top of the stairs and the bottom of the stairs. If you do not have them, ask someone to add them for you. What else can I do to help prevent falls? Wear shoes that: Do not have high heels. Have rubber bottoms. Are comfortable and fit you well. Are closed at the toe. Do not wear sandals. If you use a stepladder: Make sure that it is fully opened. Do not climb a closed stepladder. Make sure that  both sides of the stepladder are locked into place. Ask someone to hold it for you, if possible. Clearly mark and make sure that you can see: Any grab bars or handrails. First and last steps. Where the edge of each step is. Use tools that help you move around (mobility aids) if they are needed. These include: Canes. Walkers. Scooters. Crutches. Turn on the lights when you go into a dark area. Replace any light bulbs as soon as they burn out. Set up your furniture so you have a clear path. Avoid moving your furniture around. If any of your floors are uneven, fix them. If there are any pets around you, be aware of where they are. Review your medicines with your doctor. Some medicines can make you feel dizzy. This can increase your chance of falling. Ask your doctor what other things that you can do to help prevent falls. This information is not intended to replace advice given to you by your health care provider. Make sure you discuss any questions you have with your health care provider. Document Released: 04/19/2009 Document Revised: 11/29/2015 Document Reviewed: 07/28/2014 Elsevier Interactive Patient Education  2017 [employer].

## 2021-07-24 ENCOUNTER — Other Ambulatory Visit: Payer: Self-pay | Admitting: Internal Medicine

## 2021-07-24 DIAGNOSIS — M797 Fibromyalgia: Secondary | ICD-10-CM

## 2021-07-24 MED ORDER — HYDROCODONE-ACETAMINOPHEN 5-325 MG PO TABS: 1.0000 | ORAL_TABLET | Freq: Four times a day (QID) | ORAL | 0 refills | Status: DC | PRN

## 2021-07-24 NOTE — Telephone Encounter (Signed)
Medication Refill - Medication:  HYDROcodone-acetaminophen (NORCO/VICODIN) 5-325 MG tablet  Has the patient contacted their pharmacy? Yes.   Contact PCP  Preferred Pharmacy (with phone number or street name):  [id number] - [place], [place] [address]  [place], [place] [address]  Phone:  [phone]  Fax:  [phone]   Has the patient been seen for an appointment in the last year OR does the patient have an upcoming appointment? Yes.    Agent: Please be advised that RX refills may take up to 3 business days. We ask that you follow-up with your pharmacy.

## 2021-07-24 NOTE — Telephone Encounter (Signed)
Please review. Last office visit 06/22/2021.  KP

## 2021-07-24 NOTE — Telephone Encounter (Signed)
Requested medication (s) are due for refill today: yes  Requested medication (s) are on the active medication list: yes  Last refill:  06/26/21 #90/0  Future visit scheduled: yes  Notes to clinic:  Unable to refill per protocol, cannot delegate. No urine drug screen on file     Requested Prescriptions  Pending Prescriptions Disp Refills   HYDROcodone-acetaminophen (NORCO/VICODIN) 5-325 MG tablet 90 tablet 0    Sig: Take 1 tablet by mouth every 6 (six) hours as needed for moderate pain. Fibromyalgia.     Not Delegated - Analgesics:  Opioid Agonist Combinations Failed - 07/24/2021  2:04 PM      Failed - This refill cannot be delegated      Failed - Urine Drug Screen completed in last 360 days      Passed - Valid encounter within last 6 months    Recent Outpatient Visits           1 month ago Naranja, MD   7 months ago Acute non-recurrent maxillary sinusitis   [facility] Glean [facility], [facility]   10 months ago [facility] Glean [facility], MD   1 year ago Depression, major, single episode, in partial remission [facility])   [facility] Glean Hess, MD   1 year ago [facility] Glean [facility], MD       Future Appointments             In 4 months Army Melia Jesse Sans, MD [facility], [facility]

## 2021-07-26 ENCOUNTER — Ambulatory Visit
Admission: RE | Admit: 2021-07-26 | Discharge: 2021-07-26 | Disposition: A | Discharge status: Home / Self Care | Payer: Medicare PPO | Attending: Internal Medicine | Admitting: Internal Medicine

## 2021-07-26 ENCOUNTER — Encounter: Payer: Self-pay | Admitting: Internal Medicine

## 2021-07-26 ENCOUNTER — Ambulatory Visit (INDEPENDENT_AMBULATORY_CARE_PROVIDER_SITE_OTHER): Payer: Medicare PPO | Admitting: Internal Medicine

## 2021-07-26 ENCOUNTER — Ambulatory Visit
Admission: RE | Admit: 2021-07-26 | Discharge: 2021-07-26 | Disposition: A | Discharge status: Home / Self Care | Payer: Medicare PPO | Source: Ambulatory Visit | Attending: Internal Medicine | Admitting: Internal Medicine

## 2021-07-26 ENCOUNTER — Other Ambulatory Visit: Payer: Self-pay

## 2021-07-26 VITALS — BP 124/82 | HR 62 | Ht 62.0 in | Wt 179.0 lb

## 2021-07-26 DIAGNOSIS — M19042 Primary osteoarthritis, left hand: Secondary | ICD-10-CM

## 2021-07-26 DIAGNOSIS — J310 Chronic rhinitis: Secondary | ICD-10-CM | POA: Diagnosis not present

## 2021-07-26 DIAGNOSIS — M25562 Pain in left knee: Secondary | ICD-10-CM | POA: Diagnosis not present

## 2021-07-26 DIAGNOSIS — M25561 Pain in right knee: Secondary | ICD-10-CM | POA: Insufficient documentation

## 2021-07-26 DIAGNOSIS — F5101 Primary insomnia: Secondary | ICD-10-CM | POA: Diagnosis not present

## 2021-07-26 DIAGNOSIS — G2581 Restless legs syndrome: Secondary | ICD-10-CM | POA: Diagnosis not present

## 2021-07-26 DIAGNOSIS — M797 Fibromyalgia: Secondary | ICD-10-CM

## 2021-07-26 DIAGNOSIS — M25462 Effusion, left knee: Secondary | ICD-10-CM | POA: Diagnosis not present

## 2021-07-26 DIAGNOSIS — M19041 Primary osteoarthritis, right hand: Secondary | ICD-10-CM | POA: Diagnosis not present

## 2021-07-26 MED ORDER — METAXALONE 800 MG PO TABS
800.0000 mg | ORAL_TABLET | Freq: Three times a day (TID) | ORAL | 0 refills | Status: DC | PRN
Start: 2021-07-26 — End: 2022-07-14

## 2021-07-26 NOTE — Progress Notes (Signed)
Date:  07/26/2021   Name:  Sandra Mckinney   DOB:  07-05-1946   MRN:  [id number]   Chief Complaint: Joint Pain (X4 months, knees and knots on joints in fingers, takes glucosamine twice a day for joint pain, helps some) and Insomnia (Every day, 4 hours of sleep a night)  Insomnia Primary symptoms: sleep disturbance, premature morning awakening.   The current episode started more than one month. The onset quality is undetermined. The problem occurs every several days. The problem is unchanged. Past treatments include other. The treatment provided mild relief. Duration of naps:  Two to four hours.  PMH includes: restless leg syndrome, chronic pain.  Prior diagnostic workup includes:  No prior workup.  Hand Pain  There was no injury mechanism. The pain is present in the right hand and left hand. The quality of the pain is described as aching (in distal fingers with nodule formation). The pain is mild. The pain has been Fluctuating since the incident. Pertinent negatives include no chest pain or muscle weakness.  Knee Pain  There was no injury mechanism. The pain is present in the left knee and right knee (left is worse). The pain is moderate. The pain has been Fluctuating since onset. Pertinent negatives include no loss of sensation or muscle weakness. She reports no foreign bodies present. The symptoms are aggravated by palpation and weight bearing. Treatments tried: glucoseamine.  Rhinorrhea - constant clear drip from her nose every day.  Also severe congestion at night when lying down.  Has to use an humidifier and Vicks to be able to breathe.  Also takes chlortrimeton every night.  Had remote sinus procedure age 76.  No recent ENT evaluation. Restless leg - has to have requip to go to sleep.  Often has sx during the daytime naps as well.  Her calf muscles also seem to be tight and tender.  She is not sure if this is FM pain or something else.  She was previously on Skelaxin which was  helpful.  Lab Results  Component Value Date   NA 141 04/19/2020   K 3.8 04/19/2020   CO2 6 (A) 04/19/2020   GLUCOSE 88 12/06/2014   BUN 14 04/19/2020   CREATININE 1.0 04/19/2020   CALCIUM 9.4 12/06/2014   GFRNONAA 56 04/19/2020   Lab Results  Component Value Date   CHOL 208 (H) 04/16/2016   HDL 92 04/16/2016   LDLCALC 87 04/16/2016   TRIG 145 04/16/2016   CHOLHDL 2.3 04/16/2016   Lab Results  Component Value Date   TSH 0.43 02/08/2020   Lab Results  Component Value Date   HGBA1C 5.8 02/08/2020   Lab Results  Component Value Date   WBC 8.7 04/19/2020   HGB 12.7 04/19/2020   HCT 39 04/19/2020   MCV 88 07/29/2012   PLT 213 04/19/2020   Lab Results  Component Value Date   ALT 89 (A) 04/19/2020   AST 29 04/19/2020   ALKPHOS 82 07/06/2019   Lab Results  Component Value Date   VD25OH 30 02/08/2020     Review of Systems  Constitutional:  Negative for chills, fatigue and fever.  HENT:  Negative for trouble swallowing.   Respiratory:  Negative for chest tightness and shortness of breath.   Cardiovascular:  Positive for leg swelling. Negative for chest pain.  Musculoskeletal:  Positive for arthralgias, gait problem and myalgias.  Neurological:  Negative for dizziness and headaches.  Psychiatric/Behavioral:  Positive for decreased concentration and  sleep disturbance. Negative for dysphoric mood. The patient has insomnia. The patient is not nervous/anxious.    Patient Active Problem List   Diagnosis Date Noted   Mild cognitive impairment 02/08/2020   Narcotic use agreement exists 12/14/2019   Acquired absence of both breasts 06/22/2019   Fibromyalgia 07/29/2016   Hyperlipidemia 05/20/2016   Age-related osteoporosis without current pathological fracture 04/16/2016   Anemia 12/12/2015   Neoplasm of left breast, primary tumor staging category Tis: ductal carcinoma in situ (DCIS) 01/03/2015   History of left breast cancer 01/03/2015   Neoplasm of right breast,  primary tumor staging category Tis: ductal carcinoma in situ (DCIS) 12/27/2014   Anxiety disorder 10/11/2014   Plantar fasciitis 10/11/2014   Local edema 10/11/2014   Depression, major, single episode, in partial remission (Onset) 10/11/2014   Hot flash, menopausal 10/11/2014   Migraine without aura and responsive to treatment 10/11/2014   Idiopathic insomnia 10/11/2014   Psoriasis 10/11/2014   Restless leg 10/11/2014   Avitaminosis D 10/11/2014    Allergies  Allergen Reactions   Cephalexin Hives   Letrozole Swelling   Raloxifene Hives   Selective Estrogen Receptor Modulators Hives   Citalopram Other (See Comments)    Dry mouth    Cephalosporins    Duloxetine Hcl    Exemestane Other (See Comments)   Lyrica [Pregabalin]    Oxycodone    Penicillins    Prilosec  [Omeprazole]    Sulfa Antibiotics     Past Surgical History:  Procedure Laterality Date   ABDOMINAL HYSTERECTOMY  1986   uterine fibroids and endometriosis   BREAST BIOPSY Right 06/01/12   [place] bx/clip-neg   BREAST IMPLANT REMOVAL Bilateral 10/19/2019   BREAST RECONSTRUCTION Bilateral 2017   BREAST REDUCTION SURGERY  10/19/2019   all breast reconstructions removed   CARPAL TUNNEL RELEASE Right 2014   EYE SURGERY Bilateral 2009   catarcts   MASTECTOMY, RADICAL Bilateral 2016   REDUCTION MAMMAPLASTY     2001   THUMB ARTHROSCOPY Right 2014    Social History   Tobacco Use   Smoking status: Former    Packs/day: 0.75    Years: 10.00    Pack years: 7.50    Types: Cigarettes    Quit date: 04/13/1974    Years since quitting: 47.3   Smokeless tobacco: Never   Tobacco comments:    smoking cessation materials not required  Vaping Use   Vaping Use: Never used  Substance Use Topics   Alcohol use: No    Alcohol/week: 0.0 standard drinks   Drug use: No     Medication list has been reviewed and updated.  Current Meds  Medication Sig   aspirin-acetaminophen-caffeine (EXCEDRIN MIGRAINE) 250-250-65 MG tablet  Take by mouth every 6 (six) hours as needed for headache.   busPIRone (BUSPAR) 10 MG tablet TAKE 1 TABLET FOUR TIMES DAILY   Calcium-Magnesium-Vitamin D 600-40-500 MG-MG-UNIT TB24 Take 1 tablet by mouth daily.   chlorpheniramine (CHLOR-TRIMETON) 4 MG tablet Take 4 mg by mouth daily.   cholecalciferol (VITAMIN D) 1000 UNITS tablet Take 1 tablet by mouth daily.   donepezil (ARICEPT) 10 MG tablet Take by mouth.   ferrous sulfate 325 (65 FE) MG tablet Take 325 mg by mouth daily with breakfast.   furosemide (LASIX) 20 MG tablet TAKE 1 TABLET EVERY DAY (NEED MD APPOINTMENT)   gabapentin (NEURONTIN) 100 MG capsule TAKE 1 CAPSULE THREE TIMES DAILY (Patient taking differently: Nerve Pain, Fibromyalgia)   glucosamine-chondroitin 500-400 MG tablet Take 1 tablet  by mouth 2 (two) times daily.   HYDROcodone-acetaminophen (NORCO/VICODIN) 5-325 MG tablet Take 1 tablet by mouth every 6 (six) hours as needed for moderate pain. Fibromyalgia.   loperamide (IMODIUM A-D) 2 MG tablet Take 2 mg by mouth 4 (four) times daily as needed for diarrhea or loose stools.   magnesium oxide (MAG-OX) 400 MG tablet Take 400 mg by mouth 2 (two) times daily.   memantine (NAMENDA) 5 MG tablet Take 1 tablet (5 mg total) by mouth 2 (two) times daily.   MULTIPLE VITAMINS-MINERALS PO Take 1 tablet by mouth daily.   propranolol ER (INDERAL LA) 60 MG 24 hr capsule TAKE 1 CAPSULE EVERY DAY   rOPINIRole (REQUIP) 0.5 MG tablet TAKE 7 TABLETS AT BEDTIME    PHQ 2/9 Scores 06/26/2021 06/19/2021 12/14/2020 09/26/2020  PHQ - 2 Score 0 0 0 1  PHQ- 9 Score 3 3 8 1     GAD 7 : Generalized Anxiety Score 06/19/2021 12/14/2020 09/26/2020 01/11/2020  Nervous, Anxious, on Edge 0 0 0 2  Control/stop worrying 0 0 0 0  Worry too much - different things 0 0 0 0  Trouble relaxing 1 1 0 0  Restless 0 0 0 0  Easily annoyed or irritable 1 1 0 2  Afraid - awful might happen 0 0 0 0  Total GAD 7 Score 2 2 0 4  Anxiety Difficulty Not difficult at all - - Not  difficult at all    BP Readings from Last 3 Encounters:  07/26/21 124/82  06/19/21 112/80  09/26/20 128/80    Physical Exam Vitals and nursing note reviewed.  Constitutional:      General: She is not in acute distress.    Appearance: Normal appearance. She is well-developed.  HENT:     Head: Normocephalic and atraumatic.  Neck:     Vascular: No carotid bruit.  Cardiovascular:     Rate and Rhythm: Normal rate and regular rhythm.     Pulses: Normal pulses.     Heart sounds: No murmur heard. Pulmonary:     Effort: Pulmonary effort is normal. No respiratory distress.     Breath sounds: No wheezing or rhonchi.  Musculoskeletal:     Cervical back: Normal range of motion.     Right knee: No effusion or crepitus. Decreased range of motion. Tenderness present over the medial joint line. No patellar tendon tenderness.     Left knee: No effusion or crepitus. Decreased range of motion. Tenderness present over the medial joint line and patellar tendon. Normal alignment.     Right lower leg: Edema present.     Left lower leg: Edema present.     Comments: Heberden's nodes both hands No synovitis, redness, warmth, swelling to suggest an autoimmune condition  Lymphadenopathy:     Cervical: No cervical adenopathy.  Skin:    General: Skin is warm and dry.     Findings: No rash.  Neurological:     Mental Status: She is alert and oriented to person, place, and time.  Psychiatric:        Mood and Affect: Mood normal.        Behavior: Behavior normal.    Wt Readings from Last 3 Encounters:  07/26/21 179 lb (81.2 kg)  06/19/21 178 lb 6.4 oz (80.9 kg)  12/14/20 174 lb (78.9 kg)    BP 124/82 (BP Location: Other (Comment)) Comment (BP Location): right foot   Pulse 62    Ht 5\' 2"  (1.575 m)  Wt 179 lb (81.2 kg)    SpO2 95%    BMI 32.74 kg/m   Assessment and Plan: 1. Acute pain of both knees Suspect OA - continue Glucoseamine. Can use topical rubs. Will get imaging to guide further  treatment plans - DG Knee Complete 4 Views Left  2. Idiopathic insomnia Poor sleep hygiene with frequent long naps, lack of exercise, TV on while sleeping. Recommend trying to make some changes and decrease naps No medication is indicated.  3. Chronic rhinitis Needs ENT evaluation. Patient will schedule with ENT of choice - call if referral is needed  4. Fibromyalgia Continue gabapentin, vicodin Resume metaxalone - metaxalone (SKELAXIN) 800 MG tablet; Take 1 tablet (800 mg total) by mouth 3 (three) times daily as needed for muscle spasms.  Dispense: 270 tablet; Refill: 0  5. Restless leg Continue Requip   Partially dictated using Editor, commissioning. Any errors are unintentional.  Halina Maidens, MD [facility]  07/26/2021

## 2021-08-09 ENCOUNTER — Other Ambulatory Visit: Payer: Self-pay | Admitting: Internal Medicine

## 2021-08-09 NOTE — Telephone Encounter (Signed)
Requested medication (s) are due for refill today:   yes  Requested medication (s) are on the active medication list:   Yes  Future visit scheduled:   Yes   Last ordered: 03/13/2021 #90, 0 refills  Returned because there is a note that she needs an appt for further refills however it looks like she has been seen twice so wasn't sure if Dr. Army Melia wanted to see her again or if it is an old note.     Requested Prescriptions  Pending Prescriptions Disp Refills   propranolol ER (INDERAL LA) 60 MG 24 hr capsule [Pharmacy Med Name: PROPRANOLOL HYDROCHLORIDEER 60 MG Capsule Extended Release 24 Hour] 90 capsule 0    Sig: TAKE 1 CAPSULE EVERY DAY (NEED MD APPOINTMENT)     Cardiovascular:  Beta Blockers Passed - 08/09/2021 10:32 AM      Passed - Last BP in normal range    BP Readings from Last 1 Encounters:  07/26/21 124/82          Passed - Last Heart Rate in normal range    Pulse Readings from Last 1 Encounters:  07/26/21 62          Passed - Valid encounter within last 6 months    Recent Outpatient Visits           2 weeks ago Acute pain of both knees   [facility] Glean Hess, MD   1 month ago [facility] Glean Hess, MD   7 months ago Acute non-recurrent maxillary sinusitis   [facility] Glean Hess, MD   10 months ago [facility] Glean Hess, MD   1 year ago Depression, major, single episode, in partial remission [facility])   [facility] Glean [facility], MD       Future Appointments             In 4 months Army Melia Jesse Sans, MD [facility], [facility]

## 2021-08-22 ENCOUNTER — Other Ambulatory Visit: Payer: Self-pay | Admitting: Internal Medicine

## 2021-08-22 DIAGNOSIS — M797 Fibromyalgia: Secondary | ICD-10-CM

## 2021-08-22 NOTE — Telephone Encounter (Signed)
Medication Refill - Medication: HYDROcodone-acetaminophen (NORCO/VICODIN) 5-325 MG tablet   Has the patient contacted their pharmacy? Yes.   (Agent: If no, request that the patient contact the pharmacy for the refill. If patient does not wish to contact the pharmacy document the reason why and proceed with request.) (Agent: If yes, when and what did the pharmacy advise?)  Preferred Pharmacy (with phone number or street name):  [id number] [place], [address]  [place] [address]  Phone: [phone] Fax: [phone]   Has the patient been seen for an appointment in the last year OR does the patient have an upcoming appointment? Yes.    Agent: Please be advised that RX refills may take up to 3 business days. We ask that you follow-up with your pharmacy.

## 2021-08-23 ENCOUNTER — Other Ambulatory Visit: Payer: Self-pay | Admitting: Internal Medicine

## 2021-08-23 ENCOUNTER — Telehealth: Payer: Self-pay | Admitting: Internal Medicine

## 2021-08-23 DIAGNOSIS — M797 Fibromyalgia: Secondary | ICD-10-CM

## 2021-08-23 MED ORDER — HYDROCODONE-ACETAMINOPHEN 5-325 MG PO TABS: 1.0000 | ORAL_TABLET | Freq: Four times a day (QID) | ORAL | 0 refills | Status: DC | PRN

## 2021-08-23 NOTE — Telephone Encounter (Signed)
Copied from [place] [id number]. Topic: General - Other >> Aug 23, 2021  8:52 AM Tessa Lerner A wrote: Reason for CRM: Sandra Mckinney with [facility] has called to share that patient's prescription for HYDROcodone-acetaminophen (NORCO/VICODIN) 5-325 MG tablet [[id number]]  is currently on backorder and will ned to be submitted to a different pharmacy   Please contact further if needed

## 2021-08-23 NOTE — Telephone Encounter (Signed)
Please review.  KP

## 2021-08-23 NOTE — Telephone Encounter (Signed)
Requested medication (s) are due for refill today: yes  Requested medication (s) are on the active medication list: yes  Last refill:  07/24/21 #90 with 0 RF  Future visit scheduled: 12/18/21  Notes to clinic:  This medication can not be delegated, please assess.        Requested Prescriptions  Pending Prescriptions Disp Refills   HYDROcodone-acetaminophen (NORCO/VICODIN) 5-325 MG tablet 90 tablet 0    Sig: Take 1 tablet by mouth every 6 (six) hours as needed for moderate pain. Fibromyalgia.     Not Delegated - Analgesics:  Opioid Agonist Combinations Failed - 08/22/2021  3:23 PM      Failed - This refill cannot be delegated      Failed - Urine Drug Screen completed in last 360 days      Passed - Valid encounter within last 3 months    Recent Outpatient Visits           4 weeks ago Acute pain of both knees   [facility] Glean Hess, MD   2 months ago Butler, MD   8 months ago Acute non-recurrent maxillary sinusitis   [facility] Glean Hess, MD   11 months ago [facility] Glean Hess, MD   1 year ago Depression, major, single episode, in partial remission [facility])   [facility] Glean Hess, MD       Future Appointments             In 3 months Army Melia Jesse Sans, MD [facility], [facility]

## 2021-08-23 NOTE — Telephone Encounter (Signed)
Pt wants it sent to [facility] in [place].  KP

## 2021-08-23 NOTE — Telephone Encounter (Signed)
Requested medication (s) are due for refill today: yes  Requested medication (s) are on the active medication list: yes  Last refill:  06/13/21 #90 with 0 RF  Future visit scheduled: 12/18/21  Notes to clinic:  Failed protocol of labs within 180 days, (labs from 04/19/2020)  has upcoming appt, please assess.    Requested Prescriptions  Pending Prescriptions Disp Refills   furosemide (LASIX) 20 MG tablet [Pharmacy Med Name: FUROSEMIDE 20 MG Tablet] 90 tablet 0    Sig: TAKE 1 TABLET EVERY DAY (NEED MD APPOINTMENT)     Cardiovascular:  Diuretics - Loop Failed - 08/23/2021 10:46 AM      Failed - K in normal range and within 180 days    Potassium  Date Value Ref Range Status  04/19/2020 3.8 3.4 - 5.3 Final  07/29/2012 4.1 3.5 - 5.1 mmol/L Final          Failed - Ca in normal range and within 180 days    Calcium  Date Value Ref Range Status  12/06/2014 9.4 8.7 - 10.3 mg/dL Final   Calcium, Total  Date Value Ref Range Status  07/29/2012 9.1 8.5 - 10.1 mg/dL Final          Failed - Na in normal range and within 180 days    Sodium  Date Value Ref Range Status  04/19/2020 141 137 - 147 Final  07/29/2012 140 136 - 145 mmol/L Final          Failed - Cr in normal range and within 180 days    Creatinine  Date Value Ref Range Status  04/19/2020 1.0 0.5 - 1.1 Final  07/29/2012 0.93 0.60 - 1.30 mg/dL Final   Creatinine, Ser  Date Value Ref Range Status  12/06/2014 1.00 0.57 - 1.00 mg/dL Final          Failed - Cl in normal range and within 180 days    Chloride  Date Value Ref Range Status  04/19/2020 103 99 - 108 Final  07/29/2012 104 98 - 107 mmol/L Final          Failed - Mg Level in normal range and within 180 days    No results found for: MG        Passed - Last BP in normal range    BP Readings from Last 1 Encounters:  07/26/21 124/82          Passed - Valid encounter within last 6 months    Recent Outpatient Visits           4 weeks ago Acute pain of  both knees   [facility] Glean Hess, MD   2 months ago [place], Laura H, MD   8 months ago Acute non-recurrent maxillary sinusitis   [facility] Glean Hess, MD   11 months ago [facility] Glean Hess, MD   1 year ago Depression, major, single episode, in partial remission [facility])   [facility] Glean Hess, MD       Future Appointments             In 3 months Army Melia Jesse Sans, MD [facility], [facility]

## 2021-08-23 NOTE — Telephone Encounter (Signed)
Refill if appropriate.  Please advise.  

## 2021-09-02 DIAGNOSIS — Z170 Estrogen receptor positive status [ER+]: Secondary | ICD-10-CM | POA: Diagnosis not present

## 2021-09-02 DIAGNOSIS — C50412 Malignant neoplasm of upper-outer quadrant of left female breast: Secondary | ICD-10-CM | POA: Diagnosis not present

## 2021-09-03 ENCOUNTER — Encounter: Payer: Self-pay | Admitting: Internal Medicine

## 2021-09-03 ENCOUNTER — Other Ambulatory Visit: Payer: Self-pay | Admitting: Internal Medicine

## 2021-09-03 DIAGNOSIS — E559 Vitamin D deficiency, unspecified: Secondary | ICD-10-CM

## 2021-09-03 DIAGNOSIS — R600 Localized edema: Secondary | ICD-10-CM

## 2021-09-03 DIAGNOSIS — E782 Mixed hyperlipidemia: Secondary | ICD-10-CM

## 2021-09-03 DIAGNOSIS — R7303 Prediabetes: Secondary | ICD-10-CM

## 2021-09-03 DIAGNOSIS — D649 Anemia, unspecified: Secondary | ICD-10-CM

## 2021-09-11 DIAGNOSIS — G3184 Mild cognitive impairment, so stated: Secondary | ICD-10-CM | POA: Diagnosis not present

## 2021-09-11 DIAGNOSIS — G2581 Restless legs syndrome: Secondary | ICD-10-CM | POA: Diagnosis not present

## 2021-09-11 DIAGNOSIS — R799 Abnormal finding of blood chemistry, unspecified: Secondary | ICD-10-CM | POA: Diagnosis not present

## 2021-09-11 DIAGNOSIS — G4700 Insomnia, unspecified: Secondary | ICD-10-CM | POA: Diagnosis not present

## 2021-09-11 DIAGNOSIS — F3181 Bipolar II disorder: Secondary | ICD-10-CM | POA: Diagnosis not present

## 2021-09-16 ENCOUNTER — Encounter: Payer: Self-pay | Admitting: Internal Medicine

## 2021-09-18 DIAGNOSIS — E782 Mixed hyperlipidemia: Secondary | ICD-10-CM | POA: Diagnosis not present

## 2021-09-18 DIAGNOSIS — R600 Localized edema: Secondary | ICD-10-CM | POA: Diagnosis not present

## 2021-09-18 DIAGNOSIS — G2581 Restless legs syndrome: Secondary | ICD-10-CM | POA: Diagnosis not present

## 2021-09-18 DIAGNOSIS — D649 Anemia, unspecified: Secondary | ICD-10-CM | POA: Diagnosis not present

## 2021-09-18 DIAGNOSIS — E559 Vitamin D deficiency, unspecified: Secondary | ICD-10-CM | POA: Diagnosis not present

## 2021-09-18 DIAGNOSIS — R7303 Prediabetes: Secondary | ICD-10-CM | POA: Diagnosis not present

## 2021-09-18 DIAGNOSIS — G3184 Mild cognitive impairment, so stated: Secondary | ICD-10-CM | POA: Diagnosis not present

## 2021-09-18 LAB — BASIC METABOLIC PANEL
BUN: 17 (ref 4–21)
CO2: 30 — AB (ref 13–22)
Chloride: 103 (ref 99–108)
Creatinine: 1.1 (ref 0.5–1.1)
Glucose: 134
Potassium: 4 mEq/L (ref 3.5–5.1)
Sodium: 141 (ref 137–147)

## 2021-09-18 LAB — VITAMIN B12: Vitamin B-12: 824

## 2021-09-18 LAB — LIPID PANEL
Cholesterol: 192 (ref 0–200)
Cholesterol: 192 (ref 0–200)
HDL: 84 — AB (ref 35–70)
HDL: 84 — AB (ref 35–70)
LDL Cholesterol: 56
LDL Cholesterol: 56
Triglycerides: 260 — AB (ref 40–160)
Triglycerides: 261 — AB (ref 40–160)

## 2021-09-18 LAB — CBC AND DIFFERENTIAL
HCT: 38 (ref 36–46)
Hemoglobin: 12.4 (ref 12.0–16.0)
Platelets: 371 10*3/uL (ref 150–400)

## 2021-09-18 LAB — HEPATIC FUNCTION PANEL
ALT: 23 U/L (ref 7–35)
AST: 27 (ref 13–35)
Alkaline Phosphatase: 191 — AB (ref 25–125)
Bilirubin, Total: 0.3

## 2021-09-18 LAB — IRON,TIBC AND FERRITIN PANEL
%SAT: 17
Ferritin: 259.9
Iron: 42
TIBC: 249.5

## 2021-09-18 LAB — HEMOGLOBIN A1C: Hemoglobin A1C: 5.6

## 2021-09-18 LAB — TSH
TSH: 0.62 (ref 0.41–5.90)
TSH: 51.3 — AB (ref 0.41–5.90)

## 2021-09-18 LAB — COMPREHENSIVE METABOLIC PANEL
Albumin: 3.4 — AB (ref 3.5–5.0)
Calcium: 10.2 (ref 8.7–10.7)

## 2021-09-18 LAB — VITAMIN D 25 HYDROXY (VIT D DEFICIENCY, FRACTURES): Vit D, 25-Hydroxy: 51

## 2021-09-20 ENCOUNTER — Encounter: Payer: Self-pay | Admitting: Internal Medicine

## 2021-09-20 ENCOUNTER — Telehealth: Payer: Self-pay | Admitting: Internal Medicine

## 2021-09-20 ENCOUNTER — Other Ambulatory Visit: Payer: Self-pay | Admitting: Internal Medicine

## 2021-09-20 DIAGNOSIS — M797 Fibromyalgia: Secondary | ICD-10-CM

## 2021-09-20 MED ORDER — HYDROCODONE-ACETAMINOPHEN 5-325 MG PO TABS: 1.0000 | ORAL_TABLET | Freq: Four times a day (QID) | ORAL | 0 refills | Status: DC | PRN

## 2021-09-20 NOTE — Telephone Encounter (Signed)
Please let the patient know that all her labs were normal.  The only abnormal was slightly low iron levels but normal iron stores and normal blood count.  ?

## 2021-09-20 NOTE — Telephone Encounter (Signed)
Please review. Last office visit 07/26/2021. ? ?KP

## 2021-09-20 NOTE — Telephone Encounter (Signed)
Requested medication (s) are due for refill today: Yes ? ?Requested medication (s) are on the active medication list: Yes ? ?Last refill:  08/23/21 ? ?Future visit scheduled: Yes ? ?Notes to clinic:  See request. ? ? ? ?Requested Prescriptions  ?Pending Prescriptions Disp Refills  ? HYDROcodone-acetaminophen (NORCO/VICODIN) 5-325 MG tablet 90 tablet 0  ?  Sig: Take 1 tablet by mouth every 6 (six) hours as needed for moderate pain. Fibromyalgia.  ?  ? Not Delegated - Analgesics:  Opioid Agonist Combinations Failed - 09/20/2021  9:42 AM  ?  ?  Failed - This refill cannot be delegated  ?  ?  Failed - Urine Drug Screen completed in last 360 days  ?  ?  Passed - Valid encounter within last 3 months  ?  Recent Outpatient Visits   ? ?      ? 1 month ago Acute pain of both knees  ? [facility] Glean Hess, MD  ? 3 months ago Fibromyalgia  ? [facility] Glean Hess, MD  ? 9 months ago Acute non-recurrent maxillary sinusitis  ? [facility] Glean Hess, MD  ? 11 months ago Fibromyalgia  ? [facility] Glean Hess, MD  ? 1 year ago Depression, major, single episode, in partial remission [facility])  ? [facility] Glean Hess, MD  ? ?  ?  ?Future Appointments   ? ?        ? In 2 months Army Melia Jesse Sans, MD [facility], [place]  ? ?  ? ?  ?  ?  ? ?

## 2021-09-20 NOTE — Telephone Encounter (Signed)
Pt called in to request a refill for  HYDROcodone-acetaminophen (NORCO/VICODIN) 5-325 MG tablet . Pt has to request via the office.  ? ? ? ? ? [facility]  [id number] [place]  [phone] ?Fax:  [phone] ?  ? ? ?Future ov: 07/26/21 ? ?Past: 12/18/21 ?

## 2021-09-20 NOTE — Telephone Encounter (Signed)
Informed pt with results per Dr. Army Melia. Pt verbalized understanding. ? ?KP ?

## 2021-09-23 ENCOUNTER — Other Ambulatory Visit: Payer: Self-pay | Admitting: Internal Medicine

## 2021-09-23 ENCOUNTER — Encounter: Payer: Self-pay | Admitting: Internal Medicine

## 2021-09-23 DIAGNOSIS — M797 Fibromyalgia: Secondary | ICD-10-CM

## 2021-09-23 MED ORDER — HYDROCODONE-ACETAMINOPHEN 5-325 MG PO TABS: 1.0000 | ORAL_TABLET | Freq: Four times a day (QID) | ORAL | 0 refills | Status: DC | PRN

## 2021-09-26 ENCOUNTER — Other Ambulatory Visit: Payer: Self-pay | Admitting: Internal Medicine

## 2021-09-27 ENCOUNTER — Encounter: Payer: Self-pay | Admitting: Internal Medicine

## 2021-09-28 NOTE — Telephone Encounter (Signed)
Requested Prescriptions  ?Pending Prescriptions Disp Refills  ?? busPIRone (BUSPAR) 10 MG tablet [Pharmacy Med Name: BUSPIRONE HYDROCHLORIDE 10 MG Tablet] 360 tablet 0  ?  Sig: TAKE 1 TABLET FOUR TIMES DAILY  ?  ? Psychiatry: Anxiolytics/Hypnotics - Non-controlled Passed - 09/26/2021 10:45 AM  ?  ?  Passed - Valid encounter within last 12 months  ?  Recent Outpatient Visits   ?      ? 2 months ago Acute pain of both knees  ? [facility] Glean Hess, MD  ? 3 months ago Fibromyalgia  ? [facility] Glean Hess, MD  ? 9 months ago Acute non-recurrent maxillary sinusitis  ? [facility] Glean Hess, MD  ? 1 year ago Fibromyalgia  ? [facility] Glean Hess, MD  ? 1 year ago Depression, major, single episode, in partial remission [facility])  ? [facility] Glean Hess, MD  ?  ?  ?Future Appointments   ?        ? In 2 months Army Melia Jesse Sans, MD [facility], [place]  ?  ? ?  ?  ?  ? ?

## 2021-10-01 ENCOUNTER — Telehealth: Payer: Self-pay

## 2021-10-01 NOTE — Telephone Encounter (Signed)
PA completed ?Outcome- PA not needed ? ?Key: [id number] ?

## 2021-10-05 ENCOUNTER — Other Ambulatory Visit: Payer: Self-pay | Admitting: Internal Medicine

## 2021-10-06 ENCOUNTER — Encounter: Payer: Self-pay | Admitting: Internal Medicine

## 2021-10-07 ENCOUNTER — Telehealth: Payer: Self-pay

## 2021-10-07 NOTE — Telephone Encounter (Signed)
Copied from [place] [id number]. Topic: General - Other ?>> Oct 07, 2021 10:56 AM Leward Quan A wrote: ?Reason for CRM: Jake with [employer] called in needing to speak to nurse to complete prior auth for busPIRone (BUSPAR) 10 MG tablet. Would like a call back at  Ph# [phone] Ref# [id number] ?

## 2021-10-07 NOTE — Telephone Encounter (Signed)
Called [employer]. Completed a questionare for patient medication. Was told there is an interaction between buspirone and metaxalone. Dr Army Melia said pt has been taking both these medications for years with no reaction. ?

## 2021-10-18 DIAGNOSIS — G4700 Insomnia, unspecified: Secondary | ICD-10-CM | POA: Diagnosis not present

## 2021-10-18 DIAGNOSIS — F3181 Bipolar II disorder: Secondary | ICD-10-CM | POA: Diagnosis not present

## 2021-10-18 DIAGNOSIS — G3184 Mild cognitive impairment, so stated: Secondary | ICD-10-CM | POA: Diagnosis not present

## 2021-10-22 ENCOUNTER — Other Ambulatory Visit: Payer: Self-pay | Admitting: Internal Medicine

## 2021-10-22 ENCOUNTER — Telehealth: Payer: Self-pay | Admitting: Internal Medicine

## 2021-10-22 DIAGNOSIS — G2581 Restless legs syndrome: Secondary | ICD-10-CM

## 2021-10-22 DIAGNOSIS — M797 Fibromyalgia: Secondary | ICD-10-CM

## 2021-10-22 DIAGNOSIS — G3184 Mild cognitive impairment, so stated: Secondary | ICD-10-CM

## 2021-10-22 MED ORDER — HYDROCODONE-ACETAMINOPHEN 5-325 MG PO TABS: 1.0000 | ORAL_TABLET | Freq: Four times a day (QID) | ORAL | 0 refills | Status: DC | PRN

## 2021-10-22 NOTE — Telephone Encounter (Signed)
Please review. Last office visit 07/26/2021. ? ?KP

## 2021-10-22 NOTE — Telephone Encounter (Signed)
Requested Prescriptions  ?Pending Prescriptions Disp Refills  ?? rOPINIRole (REQUIP) 0.5 MG tablet [Pharmacy Med Name: ROPINIROLE HYDROCHLORIDE 0.5 MG Tablet] 630 tablet 1  ?  Sig: TAKE 7 TABLETS AT BEDTIME  ?  ? Neurology:  Parkinsonian Agents Passed - 10/22/2021 11:05 AM  ?  ?  Passed - Last BP in normal range  ?  BP Readings from Last 1 Encounters:  ?07/26/21 124/82  ?   ?  ?  Passed - Last Heart Rate in normal range  ?  Pulse Readings from Last 1 Encounters:  ?07/26/21 62  ?   ?  ?  Passed - Valid encounter within last 12 months  ?  Recent Outpatient Visits   ?      ? 2 months ago Acute pain of both knees  ? [facility] Glean Hess, MD  ? 4 months ago Fibromyalgia  ? [facility] Glean Hess, MD  ? 10 months ago Acute non-recurrent maxillary sinusitis  ? [facility] Glean Hess, MD  ? 1 year ago Fibromyalgia  ? [facility] Glean [facility], MD  ? 1 year ago Depression, major, single episode, in partial remission [facility])  ? [facility] Glean Hess, MD  ?  ?  ?Future Appointments   ?        ? In 1 month Army Melia Jesse Sans, MD [facility]  ?  ? ?  ?  ?  ?? memantine (NAMENDA) 5 MG tablet [Pharmacy Med Name: MEMANTINE HYDROCHLORIDE 5 MG Tablet] 180 tablet 1  ?  Sig: TAKE 1 TABLET TWICE DAILY  ?  ? Neurology:  Alzheimer's Agents 2 Failed - 10/22/2021 11:05 AM  ?  ?  Failed - eGFR is 5 or above and within 360 days  ?  EGFR (African American)  ?Date Value Ref Range Status  ?07/29/2012 >60  Final  ? ?GFR calc Af Wyvonnia Lora  ?Date Value Ref Range Status  ?12/06/2014 68 >59 mL/min/1.73 Final  ? ?EGFR (Non-African Amer.)  ?Date Value Ref Range Status  ?07/29/2012 >60  Final  ?  Comment:  ?  eGFR values <10m/min/1.73 m2 may be an indication of chronic ?kidney disease (CKD). ?Calculated eGFR is useful in patients with stable renal function. ?The eGFR calculation will not be reliable in acutely ill patients ?when serum creatinine is changing  rapidly. It is not useful in  ?patients on dialysis. The eGFR calculation may not be applicable ?to patients at the low and high extremes of body sizes, pregnant ?women, and vegetarians. ?  ? ?GFR calc non Af Amer  ?Date Value Ref Range Status  ?04/19/2020 56  Final  ?   ?  ?  Passed - Cr in normal range and within 360 days  ?  Creatinine  ?Date Value Ref Range Status  ?09/18/2021 1.1 0.5 - 1.1 Final  ?07/29/2012 0.93 0.60 - 1.30 mg/dL Final  ? ?Creatinine, Ser  ?Date Value Ref Range Status  ?12/06/2014 1.00 0.57 - 1.00 mg/dL Final  ?   ?  ?  Passed - Valid encounter within last 6 months  ?  Recent Outpatient Visits   ?      ? 2 months [facility]  ? [facility]  ? 10 months ago Acute non-recurrent maxillary sinusitis  ? [facility]  ? 1 year ago Fibromyalgia  ? [facility] Glean Hess, MD  ? 1 year ago Depression, major, single episode, in partial remission [facility])  ? [facility] Glean Hess, MD  ?  ?  ?Future Appointments   ?        ? In 1 month Army Melia Jesse Sans, MD [facility], [place]  ?  ? ?  ?  ?  ? ?

## 2021-10-22 NOTE — Telephone Encounter (Signed)
Medication Refill - Medication: HYDROcodone-acetaminophen (NORCO/VICODIN) 5-325 MG tablet  ? ?Has the patient contacted their pharmacy? No. ?(Agent: If no, request that the patient contact the pharmacy for the refill. If patient does not wish to contact the pharmacy document the reason why and proceed with request.) ?(Agent: If yes, when and what did the pharmacy advise?) ? ?Preferred Pharmacy (with phone number or street name): [id number] - [place], [place] [address] AT [address]  ?[place], [place] [address]  ?Phone:  [phone]  Fax:  [phone]  ?Has the patient been seen for an appointment in the last year OR does the patient have an upcoming appointment? Yes.   ? ?Agent: Please be advised that RX refills may take up to 3 business days. We ask that you follow-up with your pharmacy. ?

## 2021-11-21 DIAGNOSIS — G3184 Mild cognitive impairment, so stated: Secondary | ICD-10-CM | POA: Diagnosis not present

## 2021-11-21 DIAGNOSIS — F3181 Bipolar II disorder: Secondary | ICD-10-CM | POA: Diagnosis not present

## 2021-12-18 ENCOUNTER — Ambulatory Visit (INDEPENDENT_AMBULATORY_CARE_PROVIDER_SITE_OTHER): Payer: Medicare PPO | Admitting: Internal Medicine

## 2021-12-18 ENCOUNTER — Encounter: Payer: Self-pay | Admitting: Internal Medicine

## 2021-12-18 VITALS — BP 124/80 | HR 53 | Ht 62.0 in | Wt 169.8 lb

## 2021-12-18 DIAGNOSIS — G2581 Restless legs syndrome: Secondary | ICD-10-CM

## 2021-12-18 DIAGNOSIS — F3181 Bipolar II disorder: Secondary | ICD-10-CM | POA: Diagnosis not present

## 2021-12-18 DIAGNOSIS — G3184 Mild cognitive impairment, so stated: Secondary | ICD-10-CM | POA: Diagnosis not present

## 2021-12-18 DIAGNOSIS — M797 Fibromyalgia: Secondary | ICD-10-CM

## 2021-12-18 DIAGNOSIS — G43009 Migraine without aura, not intractable, without status migrainosus: Secondary | ICD-10-CM

## 2021-12-18 NOTE — Progress Notes (Signed)
Date:  12/18/2021   Name:  Sandra Mckinney   DOB:  05-09-1946   MRN:  [id number]   Chief Complaint: Depression, Migraine, and Fibromyalgia  Migraine  This is a chronic problem. The problem has been unchanged. Pertinent negatives include no dizziness, fever, numbness or vomiting.  Depression        This is a chronic problem.  The problem has been gradually improving since onset.  Associated symptoms include myalgias and headaches.  Associated symptoms include no fatigue, no helplessness, no hopelessness and no suicidal ideas.  Past treatments include other medications (on buspar, lamictal added recently). MCI - on Namenda and Aricept.  Gero psych thinks this is related to depression and recommends stopping Namenda.  Also discussed titrating down Buspar. Fibromyalgia - long standing, on hydrocodone tid.  Symptoms are stable - manifest as back and leg pain.  Also some change in sleep - unable to sleep more than 3 hours at a time but gets at least 6 hours per day with naps.  Tried Melatonin with little benefit.  Labs from March reviewed with patient - low iron with other labs normal. Continue otc iron supplement daily and intake of iron rich foods.  Lab Results  Component Value Date   NA 141 09/18/2021   K 4.0 09/18/2021   CO2 30 (A) 09/18/2021   GLUCOSE 88 12/06/2014   BUN 17 09/18/2021   CREATININE 1.1 09/18/2021   CALCIUM 10.2 09/18/2021   GFRNONAA 56 04/19/2020   Lab Results  Component Value Date   CHOL 192 09/18/2021   CHOL 192 09/18/2021   HDL 84 (A) 09/18/2021   HDL 84 (A) 09/18/2021   LDLCALC 56 09/18/2021   LDLCALC 56 09/18/2021   TRIG 260 (A) 09/18/2021   TRIG 261 (A) 09/18/2021   CHOLHDL 2.3 04/16/2016   Lab Results  Component Value Date   TSH 51.30 (A) 09/18/2021   TSH 0.62 09/18/2021   Lab Results  Component Value Date   HGBA1C 5.6 09/18/2021   Lab Results  Component Value Date   WBC 8.7 04/19/2020   HGB 12.4 09/18/2021   HCT 38 09/18/2021    MCV 88 07/29/2012   PLT 371 09/18/2021   Lab Results  Component Value Date   ALT 23 09/18/2021   AST 27 09/18/2021   ALKPHOS 191 (A) 09/18/2021   Lab Results  Component Value Date   VD25OH 51 09/18/2021     Review of Systems  Constitutional:  Positive for unexpected weight change (lost 10 lbs with diet change). Negative for chills, fatigue and fever.  HENT:  Negative for trouble swallowing.   Respiratory:  Negative for shortness of breath and wheezing.   Cardiovascular:  Positive for leg swelling. Negative for chest pain and palpitations.  Gastrointestinal:  Negative for constipation, diarrhea and vomiting.  Musculoskeletal:  Positive for myalgias.  Neurological:  Positive for headaches. Negative for dizziness, light-headedness and numbness.  Psychiatric/Behavioral:  Positive for depression. Negative for dysphoric mood and suicidal ideas. The patient is not nervous/anxious.     Patient Active Problem List   Diagnosis Date Noted   Primary osteoarthritis of both hands 07/26/2021   Chronic rhinitis 07/26/2021   Mild cognitive impairment 02/08/2020   Narcotic use agreement exists 12/14/2019   Acquired absence of both breasts 06/22/2019   Fibromyalgia 07/29/2016   Hyperlipidemia 05/20/2016   Age-related osteoporosis without current pathological fracture 04/16/2016   Anemia 12/12/2015   Neoplasm of left breast, primary tumor staging category Tis: ductal  carcinoma in situ (DCIS) 01/03/2015   History of left breast cancer 01/03/2015   Neoplasm of right breast, primary tumor staging category Tis: ductal carcinoma in situ (DCIS) 12/27/2014   Anxiety disorder 10/11/2014   Plantar fasciitis 10/11/2014   Local edema 10/11/2014   Bipolar 2 disorder, major depressive episode ([place]) 10/11/2014   Hot flash, menopausal 10/11/2014   Migraine without aura and responsive to treatment 10/11/2014   Idiopathic insomnia 10/11/2014   Psoriasis 10/11/2014   Restless leg 10/11/2014   Avitaminosis  D 10/11/2014    Allergies  Allergen Reactions   Cephalexin Hives   Letrozole Swelling   Raloxifene Hives   Selective Estrogen Receptor Modulators Hives   Citalopram Other (See Comments)    Dry mouth    Cephalosporins    Duloxetine Hcl    Exemestane Other (See Comments)   Lyrica [Pregabalin]    Oxycodone    Penicillins    Prilosec  [Omeprazole]    Sulfa Antibiotics     Past Surgical History:  Procedure Laterality Date   ABDOMINAL HYSTERECTOMY  1986   uterine fibroids and endometriosis   BREAST BIOPSY Right 06/01/12   [place] bx/clip-neg   BREAST IMPLANT REMOVAL Bilateral 10/19/2019   BREAST RECONSTRUCTION Bilateral 2017   BREAST REDUCTION SURGERY  10/19/2019   all breast reconstructions removed   CARPAL TUNNEL RELEASE Right 2014   EYE SURGERY Bilateral 2009   catarcts   MASTECTOMY, RADICAL Bilateral 2016   REDUCTION MAMMAPLASTY     2001   THUMB ARTHROSCOPY Right 2014    Social History   Tobacco Use   Smoking status: Former    Packs/day: 0.75    Years: 10.00    Total pack years: 7.50    Types: Cigarettes    Quit date: 04/13/1974    Years since quitting: 47.7   Smokeless tobacco: Never   Tobacco comments:    smoking cessation materials not required  Vaping Use   Vaping Use: Never used  Substance Use Topics   Alcohol use: No    Alcohol/week: 0.0 standard drinks of alcohol   Drug use: No     Medication list has been reviewed and updated.  Current Meds  Medication Sig   aspirin-acetaminophen-caffeine (EXCEDRIN MIGRAINE) 250-250-65 MG tablet Take by mouth every 6 (six) hours as needed for headache.   busPIRone (BUSPAR) 10 MG tablet TAKE 1 TABLET FOUR TIMES DAILY   Calcium-Magnesium-Vitamin D 600-40-500 MG-MG-UNIT TB24 Take 1 tablet by mouth daily.   chlorpheniramine (CHLOR-TRIMETON) 4 MG tablet Take 4 mg by mouth daily.   cholecalciferol (VITAMIN D) 1000 UNITS tablet Take 1 tablet by mouth daily.   diclofenac Sodium (VOLTAREN) 1 % GEL Apply topically 4  (four) times daily.   donepezil (ARICEPT) 10 MG tablet Take by mouth.   ferrous sulfate 325 (65 FE) MG tablet Take 325 mg by mouth daily with breakfast.   furosemide (LASIX) 20 MG tablet TAKE 1 TABLET EVERY DAY (NEED MD APPOINTMENT)   gabapentin (NEURONTIN) 100 MG capsule TAKE 1 CAPSULE THREE TIMES DAILY (Patient taking differently: Nerve Pain, Fibromyalgia)   glucosamine-chondroitin 500-400 MG tablet Take 1 tablet by mouth 2 (two) times daily.   lamoTRIgine (LAMICTAL) 25 MG tablet Take 50 mg by mouth in the morning and at bedtime.   loperamide (IMODIUM A-D) 2 MG tablet Take 2 mg by mouth 4 (four) times daily as needed for diarrhea or loose stools.   memantine (NAMENDA) 5 MG tablet TAKE 1 TABLET TWICE DAILY   metaxalone (SKELAXIN) 800  MG tablet Take 1 tablet (800 mg total) by mouth 3 (three) times daily as needed for muscle spasms.   MULTIPLE VITAMINS-MINERALS PO Take 1 tablet by mouth daily.   propranolol ER (INDERAL LA) 60 MG 24 hr capsule Take 1 capsule (60 mg total) by mouth daily.   rOPINIRole (REQUIP) 0.5 MG tablet TAKE 7 TABLETS AT BEDTIME   [DISCONTINUED] magnesium oxide (MAG-OX) 400 MG tablet Take 1 tablet by mouth in the morning and at bedtime.       12/18/2021   10:13 AM 07/26/2021    1:31 PM 06/19/2021   10:57 AM 12/14/2020    2:20 PM  GAD 7 : Generalized Anxiety Score  Nervous, Anxious, on Edge 0 0 0 0  Control/stop worrying 0 0 0 0  Worry too much - different things 0 0 0 0  Trouble relaxing 0 0 1 1  Restless 0 0 0 0  Easily annoyed or irritable '1 2 1 1  '$ Afraid - awful might happen 0 0 0 0  Total GAD 7 Score '1 2 2 2  '$ Anxiety Difficulty Not difficult at all  Not difficult at all        12/18/2021   10:13 AM  Depression screen PHQ 2/9  Decreased Interest 0  Down, Depressed, Hopeless 0  PHQ - 2 Score 0  Altered sleeping 3  Tired, decreased energy 1  Change in appetite 0  Feeling bad or failure about yourself  0  Trouble concentrating 0  Moving slowly or  fidgety/restless 0  Suicidal thoughts 0  PHQ-9 Score 4  Difficult doing work/chores Not difficult at all    BP Readings from Last 3 Encounters:  12/18/21 124/80  07/26/21 124/82  06/19/21 112/80    Physical Exam Vitals and nursing note reviewed.  Constitutional:      General: She is not in acute distress.    Appearance: Normal appearance. She is well-developed.  HENT:     Head: Normocephalic and atraumatic.  Neck:     Vascular: No carotid bruit.  Cardiovascular:     Rate and Rhythm: Normal rate and regular rhythm.     Heart sounds: No murmur heard. Pulmonary:     Effort: Pulmonary effort is normal. No respiratory distress.     Breath sounds: No wheezing or rhonchi.  Musculoskeletal:        General: Swelling present.     Cervical back: Normal range of motion.     Right lower leg: No edema.     Left lower leg: No edema.  Lymphadenopathy:     Cervical: No cervical adenopathy.  Skin:    General: Skin is warm and dry.     Capillary Refill: Capillary refill takes less than 2 seconds.     Findings: No rash.  Neurological:     General: No focal deficit present.     Mental Status: She is alert and oriented to person, place, and time.  Psychiatric:        Mood and Affect: Mood normal.        Behavior: Behavior normal.     Wt Readings from Last 3 Encounters:  12/18/21 169 lb 12.8 oz (77 kg)  07/26/21 179 lb (81.2 kg)  06/19/21 178 lb 6.4 oz (80.9 kg)    BP 124/80   Pulse (!) 53   Ht '5\' 2"'$  (1.575 m)   Wt 169 lb 12.8 oz (77 kg)   SpO2 96%   BMI 31.06 kg/m   Assessment and Plan:  1. Migraine without aura and responsive to treatment Clinically stable without change in character or frequency of migraine headaches Headaches respond well to current therapy with propranolol daily and PRN Excedrin Migraine. Will continue current plan, follow up if worsening.  2. Bipolar 2 disorder, major depressive episode ([place]) Felt to be contributing to MCI Now on Lamictal with  improvement - brighter mood, improved energy and decrease in irritability.  3. Fibromyalgia Continue Vicodin tid  4. Restless leg Symptoms are well controlled on max dose Requip.  5. Mild cognitive impairment Stable or improved Follow up with Gerontologist Will defer stopping Namenda to Dr. Laverta [place]   Partially dictated using Dragon software. Any errors are unintentional.  Halina Maidens, MD [facility]  12/18/2021

## 2022-01-22 ENCOUNTER — Other Ambulatory Visit: Payer: Self-pay | Admitting: Internal Medicine

## 2022-01-23 NOTE — Telephone Encounter (Signed)
Requested medication (s) are due for refill today:   Yes  Requested medication (s) are on the active medication list:   Yes  Future visit scheduled:   Yes   Last ordered: 08/26/2021 #90, 0 refills  Returned because a mag level is needed per protocol.   Requested Prescriptions  Pending Prescriptions Disp Refills   furosemide (LASIX) 20 MG tablet [Pharmacy Med Name: FUROSEMIDE 20 MG Tablet] 90 tablet 0    Sig: TAKE 1 TABLET EVERY DAY (NEED MD APPOINTMENT)     Cardiovascular:  Diuretics - Loop Failed - 01/22/2022  8:46 AM      Failed - Mg Level in normal range and within 180 days    No results found for: "MG"       Passed - K in normal range and within 180 days    Potassium  Date Value Ref Range Status  09/18/2021 4.0 3.5 - 5.1 mEq/L Final  07/29/2012 4.1 3.5 - 5.1 mmol/L Final         Passed - Ca in normal range and within 180 days    Calcium  Date Value Ref Range Status  09/18/2021 10.2 8.7 - 10.7 Final   Calcium, Total  Date Value Ref Range Status  07/29/2012 9.1 8.5 - 10.1 mg/dL Final         Passed - Na in normal range and within 180 days    Sodium  Date Value Ref Range Status  09/18/2021 141 137 - 147 Final  07/29/2012 140 136 - 145 mmol/L Final         Passed - Cr in normal range and within 180 days    Creatinine  Date Value Ref Range Status  09/18/2021 1.1 0.5 - 1.1 Final  07/29/2012 0.93 0.60 - 1.30 mg/dL Final   Creatinine, Ser  Date Value Ref Range Status  12/06/2014 1.00 0.57 - 1.00 mg/dL Final         Passed - Cl in normal range and within 180 days    Chloride  Date Value Ref Range Status  09/18/2021 103 99 - 108 Final  07/29/2012 104 98 - 107 mmol/L Final         Passed - Last BP in normal range    BP Readings from Last 1 Encounters:  12/18/21 124/80         Passed - Valid encounter within last 6 months    Recent Outpatient Visits           1 month ago Migraine without aura and responsive to treatment   [facility]an [facility], [facility]   6 months ago Acute pain of both knees   [facility] Glean [facility], [facility]   7 months ago [facility] Glean Hess, MD   1 year ago Acute non-recurrent maxillary sinusitis   [facility] Glean Hess, MD   1 year ago [facility] Glean Hess, MD       Future Appointments             In 4 months Army Melia Jesse Sans, MD [facility], [facility]

## 2022-03-03 ENCOUNTER — Other Ambulatory Visit: Payer: Self-pay | Admitting: Internal Medicine

## 2022-03-03 ENCOUNTER — Encounter: Payer: Self-pay | Admitting: Internal Medicine

## 2022-03-03 DIAGNOSIS — M797 Fibromyalgia: Secondary | ICD-10-CM

## 2022-03-03 MED ORDER — HYDROCODONE-ACETAMINOPHEN 5-325 MG PO TABS: 1.0000 | ORAL_TABLET | Freq: Four times a day (QID) | ORAL | 0 refills | Status: DC | PRN

## 2022-03-03 NOTE — Telephone Encounter (Signed)
Please review.  KP

## 2022-03-04 DIAGNOSIS — M71342 Other bursal cyst, left hand: Secondary | ICD-10-CM | POA: Diagnosis not present

## 2022-03-04 DIAGNOSIS — L988 Other specified disorders of the skin and subcutaneous tissue: Secondary | ICD-10-CM | POA: Diagnosis not present

## 2022-03-04 DIAGNOSIS — L578 Other skin changes due to chronic exposure to nonionizing radiation: Secondary | ICD-10-CM | POA: Diagnosis not present

## 2022-03-04 DIAGNOSIS — L821 Other seborrheic keratosis: Secondary | ICD-10-CM | POA: Diagnosis not present

## 2022-03-04 DIAGNOSIS — X32XXXS Exposure to sunlight, sequela: Secondary | ICD-10-CM | POA: Diagnosis not present

## 2022-03-04 DIAGNOSIS — Z872 Personal history of diseases of the skin and subcutaneous tissue: Secondary | ICD-10-CM | POA: Diagnosis not present

## 2022-03-04 DIAGNOSIS — L568 Other specified acute skin changes due to ultraviolet radiation: Secondary | ICD-10-CM | POA: Diagnosis not present

## 2022-03-04 DIAGNOSIS — Z86018 Personal history of other benign neoplasm: Secondary | ICD-10-CM | POA: Diagnosis not present

## 2022-03-04 NOTE — Telephone Encounter (Signed)
Requested Prescriptions  Pending Prescriptions Disp Refills  . busPIRone (BUSPAR) 10 MG tablet [Pharmacy Med Name: BUSPIRONE HYDROCHLORIDE 10 MG Tablet] 360 tablet 0    Sig: TAKE 1 TABLET FOUR TIMES DAILY     Psychiatry: Anxiolytics/Hypnotics - Non-controlled Passed - 03/03/2022 10:39 AM      Passed - Valid encounter within last 12 months    Recent Outpatient Visits          2 months ago Migraine without aura and responsive to treatment   [place] [facility] at [facility], Jesse Sans, MD   7 months ago Acute pain of both knees   [place] [facility] at [facility], Jesse Sans, MD   8 months ago [place] [facility] at [facility], Jesse Sans, MD   1 year ago Acute non-recurrent maxillary sinusitis   [place] [facility] at [facility], Jesse Sans, MD   1 year ago [place] [facility] at [facility], Jesse Sans, MD      Future Appointments            In 3 months Army Melia, Jesse Sans, MD [place] [facility] at [facility], [facility]

## 2022-03-31 ENCOUNTER — Other Ambulatory Visit: Payer: Self-pay | Admitting: Internal Medicine

## 2022-03-31 DIAGNOSIS — M797 Fibromyalgia: Secondary | ICD-10-CM

## 2022-03-31 NOTE — Telephone Encounter (Signed)
Medication Refill - Medication: HYDROcodone-acetaminophen (NORCO/VICODIN) 5-325 MG tablet  Has the patient contacted their pharmacy? Yes.   (Agent: If no, request that the patient contact the pharmacy for the refill. If patient does not wish to contact the pharmacy document the reason why and proceed with request.) (Agent: If yes, when and what did the pharmacy advise?)call dr  Preferred Pharmacy (with phone number or street name):   [id number] [place] [place](Ph: [phone]   Has the patient been seen for an appointment in the last year OR does the patient have an upcoming appointment? Yes.    Agent: Please be advised that RX refills may take up to 3 business days. We ask that you follow-up with your pharmacy.

## 2022-04-01 ENCOUNTER — Other Ambulatory Visit: Payer: Self-pay | Admitting: Internal Medicine

## 2022-04-01 DIAGNOSIS — M797 Fibromyalgia: Secondary | ICD-10-CM

## 2022-04-01 MED ORDER — HYDROCODONE-ACETAMINOPHEN 5-325 MG PO TABS: 1.0000 | ORAL_TABLET | Freq: Four times a day (QID) | ORAL | 0 refills | Status: DC | PRN

## 2022-04-01 NOTE — Telephone Encounter (Signed)
Requested medication (s) are due for refill today: yes  Requested medication (s) are on the active medication list: yes    Last refill: 03/03/22  #90  0 refills  Future visit scheduled yes 06/19/22  Notes to clinic:  Not delegated, please review.  Requested Prescriptions  Pending Prescriptions Disp Refills   HYDROcodone-acetaminophen (NORCO/VICODIN) 5-325 MG tablet 90 tablet 0    Sig: Take 1 tablet by mouth every 6 (six) hours as needed for moderate pain. Fibromyalgia.     Not Delegated - Analgesics:  Opioid Agonist Combinations Failed - 03/31/2022 11:36 AM      Failed - This refill cannot be delegated      Failed - Urine Drug Screen completed in last 360 days      Failed - Valid encounter within last 3 months    Recent Outpatient Visits           3 months ago Migraine without aura and responsive to treatment   [place] [facility] at [facility], Jesse Sans, MD   8 months ago Acute pain of both knees   [place] [facility] at [facility], Jesse Sans, MD   9 months ago [place] and [facility] Medicine at [facility], Jesse Sans, MD   1 year ago Acute non-recurrent maxillary sinusitis    Primary Care and Sports Medicine at [facility], Jesse Sans, MD   1 year ago [place] [facility] at [facility], Jesse Sans, MD       Future Appointments             In 2 months Army Melia, Jesse Sans, MD [place] [facility] at [facility], [facility]

## 2022-04-01 NOTE — Telephone Encounter (Signed)
Please review. Last office visit 12/18/2021.  KP

## 2022-04-09 ENCOUNTER — Other Ambulatory Visit: Payer: Self-pay | Admitting: Internal Medicine

## 2022-04-09 NOTE — Telephone Encounter (Signed)
Future visit in 2 months . Requested Prescriptions  Pending Prescriptions Disp Refills  . propranolol ER (INDERAL LA) 60 MG 24 hr capsule [Pharmacy Med Name: PROPRANOLOL HYDROCHLORIDEER 60 MG Capsule Extended Release 24 Hour] 90 capsule 0    Sig: TAKE 1 CAPSULE EVERY DAY     Cardiovascular:  Beta Blockers Passed - 04/09/2022 10:10 AM      Passed - Last BP in normal range    BP Readings from Last 1 Encounters:  12/18/21 124/80         Passed - Last Heart Rate in normal range    Pulse Readings from Last 1 Encounters:  12/18/21 (!) 53         Passed - Valid encounter within last 6 months    Recent Outpatient Visits          3 months ago Migraine without aura and responsive to treatment   [facility] at [facility], Jesse Sans, MD   8 months ago Acute pain of both knees   [place] [facility] at [facility], Jesse Sans, MD   9 months ago [place] and Sports Medicine at [facility], Jesse Sans, MD   1 year ago Acute non-recurrent maxillary sinusitis   [place] [facility] at [facility], Jesse Sans, MD   1 year ago [place] [facility] and [facility] at [facility], Jesse Sans, MD      Future Appointments            In 2 months Army Melia, Jesse Sans, MD [place] [facility] at [facility], [facility]

## 2022-04-28 ENCOUNTER — Other Ambulatory Visit: Payer: Self-pay | Admitting: Internal Medicine

## 2022-04-28 DIAGNOSIS — M797 Fibromyalgia: Secondary | ICD-10-CM

## 2022-04-28 NOTE — Telephone Encounter (Signed)
Medication Refill - Medication: Hydrocodone 5 mg  Has the patient contacted their pharmacy? No. (Agent: If no, request that the patient contact the pharmacy for the refill. If patient does not wish to contact the pharmacy document the reason why and proceed with request.) (Agent: If yes, when and what did the pharmacy advise?)  Preferred Pharmacy (with phone number [facility] street [facility] [place]  Has the patient been seen for an appointment in the last year OR does the patient have an upcoming appointment? Yes.    Agent: Please be advised that RX refills may take up to 3 business days. We ask that you follow-up with your pharmacy.

## 2022-04-29 MED ORDER — HYDROCODONE-ACETAMINOPHEN 5-325 MG PO TABS: 1.0000 | ORAL_TABLET | Freq: Four times a day (QID) | ORAL | 0 refills | Status: DC | PRN

## 2022-04-29 NOTE — Telephone Encounter (Signed)
Please review. Last office visit 12/18/2021.  KP

## 2022-04-29 NOTE — Telephone Encounter (Signed)
Requested medication (s) are due for refill today - yes  Requested medication (s) are on the active medication list -yes  Future visit scheduled -yes  Last refill: 04/01/22 #90   Notes to clinic: non delegated Rx  Requested Prescriptions  Pending Prescriptions Disp Refills   HYDROcodone-acetaminophen (NORCO/VICODIN) 5-325 MG tablet 90 tablet 0    Sig: Take 1 tablet by mouth every 6 (six) hours as needed for moderate pain. Fibromyalgia.     Not Delegated - Analgesics:  Opioid Agonist Combinations Failed - 04/28/2022 12:18 PM      Failed - This refill cannot be delegated      Failed - Urine Drug Screen completed in last 360 days      Failed - Valid encounter within last 3 months    Recent Outpatient Visits           4 months ago Migraine without aura and responsive to treatment   [place] [facility] at [facility], Jesse Sans, MD   9 months ago Acute pain of both knees   [place] [facility] and [facility] Medicine at [facility], Jesse Sans, MD   10 months ago [place] and Sports Medicine at [facility], Jesse Sans, MD   1 year ago Acute non-recurrent maxillary sinusitis   [place] [facility] and [facility] Medicine at [facility], Jesse Sans, MD   1 year ago [place] and Sports Medicine at [facility], Jesse Sans, MD       Future Appointments             In 1 month Glean Hess, MD [facility] Primary Care and Sports Medicine at [employer] [facility], [facility]               Requested Prescriptions  Pending Prescriptions Disp Refills   HYDROcodone-acetaminophen (NORCO/VICODIN) 5-325 MG tablet 90 tablet 0    Sig: Take 1 tablet by mouth every 6 (six) hours as needed for moderate pain. Fibromyalgia.     Not Delegated - Analgesics:  Opioid Agonist Combinations Failed - 04/28/2022 12:18 PM      Failed - This refill cannot be  delegated      Failed - Urine Drug Screen completed in last 360 days      Failed - Valid encounter within last 3 months    Recent Outpatient Visits           4 months ago Migraine without aura and responsive to treatment   [place] [facility] Medicine at [facility], Jesse Sans, MD   9 months ago Acute pain of both knees   [place] [facility] and Sports Medicine at [facility], Jesse Sans, MD   10 months ago [place] [facility] and Sports Medicine at [facility], Jesse Sans, MD   1 year ago Acute non-recurrent maxillary sinusitis   [place] [facility] and [facility] Medicine at [facility], Jesse Sans, MD   1 year ago [place] [facility] and Sports Medicine at [facility], Jesse Sans, MD       Future Appointments             In 1 month Army Melia, Jesse Sans, MD [place] Primary [facility] and Sports Medicine at [facility], [facility]

## 2022-05-30 ENCOUNTER — Telehealth: Payer: Self-pay | Admitting: Internal Medicine

## 2022-05-30 NOTE — Telephone Encounter (Unsigned)
Copied from [place] [id number]. Topic: General - Other >> May 30, 2022  8:50 AM Everette C wrote: Reason for CRM: Medication Refill - Medication: HYDROcodone-acetaminophen (NORCO/VICODIN) 5-325 MG tablet [[id number]]   Has the patient contacted their pharmacy? No. (Agent: If no, request that the patient contact the pharmacy for the refill. If patient does not wish to contact the pharmacy document the reason why and proceed with request.) (Agent: If yes, when and what did the pharmacy advise?)  Preferred Pharmacy (with phone number or street name): [id number] [place] [id number]: [phone]: [phone]: Not open 24 hours   Has the patient been seen for an appointment in the last year OR does the patient have an upcoming appointment? Yes.    Agent: Please be advised that RX refills may take up to 3 business days. We ask that you follow-up with your pharmacy.

## 2022-06-02 ENCOUNTER — Other Ambulatory Visit: Payer: Self-pay | Admitting: Internal Medicine

## 2022-06-02 DIAGNOSIS — M797 Fibromyalgia: Secondary | ICD-10-CM

## 2022-06-02 MED ORDER — HYDROCODONE-ACETAMINOPHEN 5-325 MG PO TABS: 1.0000 | ORAL_TABLET | Freq: Four times a day (QID) | ORAL | 0 refills | Status: DC | PRN

## 2022-06-04 DIAGNOSIS — F3181 Bipolar II disorder: Secondary | ICD-10-CM | POA: Diagnosis not present

## 2022-06-04 DIAGNOSIS — G3184 Mild cognitive impairment, so stated: Secondary | ICD-10-CM | POA: Diagnosis not present

## 2022-06-04 DIAGNOSIS — G4700 Insomnia, unspecified: Secondary | ICD-10-CM | POA: Diagnosis not present

## 2022-06-19 ENCOUNTER — Ambulatory Visit (INDEPENDENT_AMBULATORY_CARE_PROVIDER_SITE_OTHER): Payer: Medicare PPO | Admitting: Internal Medicine

## 2022-06-19 ENCOUNTER — Encounter: Payer: Self-pay | Admitting: Internal Medicine

## 2022-06-19 VITALS — BP 110/78 | HR 54 | Ht 62.0 in | Wt 176.0 lb

## 2022-06-19 DIAGNOSIS — G3184 Mild cognitive impairment, so stated: Secondary | ICD-10-CM

## 2022-06-19 DIAGNOSIS — M797 Fibromyalgia: Secondary | ICD-10-CM | POA: Diagnosis not present

## 2022-06-19 DIAGNOSIS — G2581 Restless legs syndrome: Secondary | ICD-10-CM

## 2022-06-19 DIAGNOSIS — F3181 Bipolar II disorder: Secondary | ICD-10-CM | POA: Diagnosis not present

## 2022-06-19 DIAGNOSIS — G43009 Migraine without aura, not intractable, without status migrainosus: Secondary | ICD-10-CM

## 2022-06-19 NOTE — Progress Notes (Signed)
Date:  06/19/2022   Name:  Sandra Mckinney   DOB:  10-18-1945   MRN:  [id number]   Chief Complaint: Migraine and Fibromyalgia  HPI Fibromyalgia - long standing chronic and unchanged. On hydrocodone tid for years.  No evidence of misuse.  She has tried to take less and has managed with only twice a day at times. RLS - on Requip nightly with good control of symptoms.  Occasional breakthrough for which she takes metaxalone. [facility] geriatrics is following. She is on Namenda bid.  Last visit Trazodone 50 mg was added at bedtime. She believes that she is sleeping a bit better and getting at least 4 hours of sleep per night.  Her husband says that her memory issues have stabilized. Bipolar II - on Lamictal and having stabilized mood.  Continues also on Buspar. Migraine - she has not had a migraine headache in months.  Lab Results  Component Value Date   NA 141 09/18/2021   K 4.0 09/18/2021   CO2 30 (A) 09/18/2021   GLUCOSE 88 12/06/2014   BUN 17 09/18/2021   CREATININE 1.1 09/18/2021   CALCIUM 10.2 09/18/2021   GFRNONAA 56 04/19/2020   Lab Results  Component Value Date   CHOL 192 09/18/2021   CHOL 192 09/18/2021   HDL 84 (A) 09/18/2021   HDL 84 (A) 09/18/2021   LDLCALC 56 09/18/2021   LDLCALC 56 09/18/2021   TRIG 260 (A) 09/18/2021   TRIG 261 (A) 09/18/2021   CHOLHDL 2.3 04/16/2016   Lab Results  Component Value Date   TSH 51.30 (A) 09/18/2021   TSH 0.62 09/18/2021   Lab Results  Component Value Date   HGBA1C 5.6 09/18/2021   Lab Results  Component Value Date   WBC 8.7 04/19/2020   HGB 12.4 09/18/2021   HCT 38 09/18/2021   MCV 88 07/29/2012   PLT 371 09/18/2021   Lab Results  Component Value Date   ALT 23 09/18/2021   AST 27 09/18/2021   ALKPHOS 191 (A) 09/18/2021   Lab Results  Component Value Date   VD25OH 51 09/18/2021     Review of Systems  Constitutional:  Positive for fatigue. Negative for fever and unexpected weight change.  HENT:   Negative for trouble swallowing.   Respiratory:  Negative for chest tightness and shortness of breath.   Cardiovascular:  Positive for leg swelling. Negative for chest pain and palpitations.  Gastrointestinal:  Negative for abdominal pain, constipation and diarrhea.  Genitourinary:  Negative for dysuria.  Musculoskeletal:  Positive for arthralgias and myalgias.  Neurological:  Negative for dizziness, light-headedness and headaches.  Psychiatric/Behavioral:  Positive for sleep disturbance (improved). Negative for dysphoric mood. The patient is not nervous/anxious.     Patient Active Problem List   Diagnosis Date Noted   Primary osteoarthritis of both hands 07/26/2021   Chronic rhinitis 07/26/2021   Mild cognitive impairment 02/08/2020   Narcotic use agreement exists 12/14/2019   Acquired absence of both breasts 06/22/2019   Fibromyalgia 07/29/2016   Hyperlipidemia 05/20/2016   Age-related osteoporosis without current pathological fracture 04/16/2016   Anemia 12/12/2015   Neoplasm of left breast, primary tumor staging category Tis: ductal carcinoma in situ (DCIS) 01/03/2015   History of left breast cancer 01/03/2015   Neoplasm of right breast, primary tumor staging category Tis: ductal carcinoma in situ (DCIS) 12/27/2014   Anxiety disorder 10/11/2014   Plantar fasciitis 10/11/2014   Local edema 10/11/2014   Bipolar 2 disorder, major  depressive episode ([place]) 10/11/2014   Hot flash, menopausal 10/11/2014   Migraine without aura and responsive to treatment 10/11/2014   Idiopathic insomnia 10/11/2014   Psoriasis 10/11/2014   Restless leg 10/11/2014   Avitaminosis D 10/11/2014    Allergies  Allergen Reactions   Cephalexin Hives   Letrozole Swelling   Raloxifene Hives   Selective Estrogen Receptor Modulators Hives   Citalopram Other (See Comments)    Dry mouth    Cephalosporins    Duloxetine Hcl    Exemestane Other (See Comments)   Lyrica [Pregabalin]    Oxycodone     Penicillins    Prilosec  [Omeprazole]    Sulfa Antibiotics     Past Surgical History:  Procedure Laterality Date   ABDOMINAL HYSTERECTOMY  1986   uterine fibroids and endometriosis   BREAST BIOPSY Right 06/01/12   [place] bx/clip-neg   BREAST IMPLANT REMOVAL Bilateral 10/19/2019   BREAST RECONSTRUCTION Bilateral 2017   BREAST REDUCTION SURGERY  10/19/2019   all breast reconstructions removed   CARPAL TUNNEL RELEASE Right 2014   EYE SURGERY Bilateral 2009   catarcts   MASTECTOMY, RADICAL Bilateral 2016   REDUCTION MAMMAPLASTY     2001   THUMB ARTHROSCOPY Right 2014    Social History   Tobacco Use   Smoking status: Former    Packs/day: 0.75    Years: 10.00    Total pack years: 7.50    Types: Cigarettes    Quit date: 04/13/1974    Years since quitting: 48.2   Smokeless tobacco: Never   Tobacco comments:    smoking cessation materials not required  Vaping Use   Vaping Use: Never used  Substance Use Topics   Alcohol use: No    Alcohol/week: 0.0 standard drinks of alcohol   Drug use: No     Medication list has been reviewed and updated.  Current Meds  Medication Sig   aspirin-acetaminophen-caffeine (EXCEDRIN MIGRAINE) 250-250-65 MG tablet Take by mouth every 6 (six) hours as needed for headache.   busPIRone (BUSPAR) 10 MG tablet TAKE 1 TABLET FOUR TIMES DAILY   Calcium-Magnesium-Vitamin D 600-40-500 MG-MG-UNIT TB24 Take 1 tablet by mouth daily.   chlorpheniramine (CHLOR-TRIMETON) 4 MG tablet Take 4 mg by mouth daily.   cholecalciferol (VITAMIN D) 1000 UNITS tablet Take 1 tablet by mouth daily.   diclofenac Sodium (VOLTAREN) 1 % GEL Apply topically 4 (four) times daily.   donepezil (ARICEPT) 10 MG tablet Take by mouth.   ferrous sulfate 325 (65 FE) MG tablet Take 325 mg by mouth daily with breakfast.   furosemide (LASIX) 20 MG tablet TAKE 1 TABLET EVERY DAY (NEED MD APPOINTMENT)   gabapentin (NEURONTIN) 100 MG capsule TAKE 1 CAPSULE THREE TIMES DAILY (Patient taking  differently: Nerve Pain, Fibromyalgia)   glucosamine-chondroitin 500-400 MG tablet Take 1 tablet by mouth 2 (two) times daily.   HYDROcodone-acetaminophen (NORCO/VICODIN) 5-325 MG tablet Take 1 tablet by mouth every 6 (six) hours as needed for moderate pain. Fibromyalgia.   lamoTRIgine (LAMICTAL) 25 MG tablet Take 50 mg by mouth in the morning and at bedtime.   memantine (NAMENDA) 5 MG tablet TAKE 1 TABLET TWICE DAILY   metaxalone (SKELAXIN) 800 MG tablet Take 1 tablet (800 mg total) by mouth 3 (three) times daily as needed for muscle spasms.   MULTIPLE VITAMINS-MINERALS PO Take 1 tablet by mouth daily.   propranolol ER (INDERAL LA) 60 MG 24 hr capsule TAKE 1 CAPSULE EVERY DAY   rOPINIRole (REQUIP) 0.5 MG tablet  TAKE 7 TABLETS AT BEDTIME   traZODone (DESYREL) 50 MG tablet Take 50 mg by mouth at bedtime.   [DISCONTINUED] loperamide (IMODIUM A-D) 2 MG tablet Take 2 mg by mouth 4 (four) times daily as needed for diarrhea or loose stools.       06/19/2022    9:15 AM 12/18/2021   10:13 AM 07/26/2021    1:31 PM 06/19/2021   10:57 AM  GAD 7 : Generalized Anxiety Score  Nervous, Anxious, on Edge 0 0 0 0  Control/stop worrying 0 0 0 0  Worry too much - different things 0 0 0 0  Trouble relaxing 0 0 0 1  Restless 0 0 0 0  Easily annoyed or irritable 0 '1 2 1  '$ Afraid - awful might happen 0 0 0 0  Total GAD 7 Score 0 '1 2 2  '$ Anxiety Difficulty Not difficult at all Not difficult at all  Not difficult at all       06/19/2022    9:14 AM 12/18/2021   10:13 AM 07/26/2021    1:30 PM  Depression screen PHQ 2/9  Decreased Interest 2 0 0  Down, Depressed, Hopeless 2 0 0  PHQ - 2 Score 4 0 0  Altered sleeping '1 3 3  '$ Tired, decreased energy '1 1 1  '$ Change in appetite 0 0 2  Feeling bad or failure about yourself  0 0 0  Trouble concentrating 0 0 0  Moving slowly or fidgety/restless 0 0 0  Suicidal thoughts 0 0 0  PHQ-9 Score '6 4 6  '$ Difficult doing work/chores Somewhat difficult Not difficult at all  Not difficult at all    BP Readings from Last 3 Encounters:  06/19/22 110/78  12/18/21 124/80  07/26/21 124/82    Physical Exam Vitals and nursing note reviewed.  Constitutional:      General: She is not in acute distress.    Appearance: She is well-developed.  HENT:     Head: Normocephalic and atraumatic.  Neck:     Vascular: No carotid bruit.  Cardiovascular:     Rate and Rhythm: Normal rate and regular rhythm.  Pulmonary:     Effort: Pulmonary effort is normal. No respiratory distress.     Breath sounds: No wheezing or rhonchi.  Musculoskeletal:        General: Swelling present.     Cervical back: Normal range of motion.     Right lower leg: No edema.     Left lower leg: No edema.  Lymphadenopathy:     Cervical: No cervical adenopathy.  Skin:    General: Skin is warm and dry.     Capillary Refill: Capillary refill takes less than 2 seconds.     Findings: No rash.  Neurological:     General: No focal deficit present.     Mental Status: She is alert and oriented to person, place, and time.  Psychiatric:        Mood and Affect: Mood normal.        Behavior: Behavior normal.     Wt Readings from Last 3 Encounters:  06/19/22 176 lb (79.8 kg)  12/18/21 169 lb 12.8 oz (77 kg)  07/26/21 179 lb (81.2 kg)    BP 110/78   Pulse (!) 54   Ht '5\' 2"'$  (1.575 m)   Wt 176 lb (79.8 kg)   SpO2 97%   BMI 32.19 kg/m   Assessment and Plan: 1. Fibromyalgia Stable chronic symptoms primarily upper back, neck  and arms Continue Vicodin 2-3 per day along with gabapentin. Call for refill when needed.   2. Mild cognitive impairment Stable on current medications Followed by [facility] done in March are reviewed and are normal.  3. Bipolar 2 disorder, major depressive episode ([place]) On lamictal with excellent improvement in mood. No SI/HI.  4. Restless leg Continue Requip at bedtime.  5. Migraine without aura and responsive to treatment No recent headaches.  Continue beta blocker for prevention. Will continue to monitor.   Partially dictated using Editor, commissioning. Any errors are unintentional.  Halina Maidens, MD [facility]  06/19/2022

## 2022-06-26 ENCOUNTER — Other Ambulatory Visit: Payer: Self-pay | Admitting: Internal Medicine

## 2022-06-26 DIAGNOSIS — M797 Fibromyalgia: Secondary | ICD-10-CM

## 2022-06-26 MED ORDER — HYDROCODONE-ACETAMINOPHEN 5-325 MG PO TABS: 1.0000 | ORAL_TABLET | Freq: Four times a day (QID) | ORAL | 0 refills | Status: DC | PRN

## 2022-06-26 NOTE — Telephone Encounter (Signed)
Please review. Last office visit 06/19/22.  KP

## 2022-06-26 NOTE — Telephone Encounter (Signed)
Medication Refill - Medication: HYDROcodone-acetaminophen (NORCO/VICODIN) 5-325 MG tablet   Has the patient contacted their pharmacy? No. Pt previously told to contact provider.  Preferred Pharmacy (with phone number or street name):  [id number] [place] [place]: [phone] Fax: [phone]    Has the patient been seen for an appointment in the last year OR does the patient have an upcoming appointment? Yes.    Agent: Please be advised that RX refills may take up to 3 business days. We ask that you follow-up with your pharmacy.

## 2022-06-26 NOTE — Telephone Encounter (Signed)
Requested medication (s) are due for refill today: routing for approval  Requested medication (s) are on the active medication list: yes  Last refill:  06/02/22  Future visit scheduled:yes  Notes to clinic:  Unable to refill per protocol, cannot delegate.      Requested Prescriptions  Pending Prescriptions Disp Refills   HYDROcodone-acetaminophen (NORCO/VICODIN) 5-325 MG tablet 90 tablet 0    Sig: Take 1 tablet by mouth every 6 (six) hours as needed for moderate pain. Fibromyalgia.     Not Delegated - Analgesics:  Opioid Agonist Combinations Failed - 06/26/2022 11:20 AM      Failed - This refill cannot be delegated      Failed - Urine Drug Screen completed in last 360 days      Passed - Valid encounter within last 3 months    Recent Outpatient Visits           1 week ago [place] and Sports Medicine at [facility], Jesse Sans, MD   6 months ago Migraine without aura and responsive to treatment   [facility] and Sports Medicine at [facility], Jesse Sans, MD   11 months ago Acute pain of both knees   [place] [facility] and Sports Medicine at [facility], Jesse Sans, MD   1 year ago [place] [facility] Care and Sports Medicine at [facility], Jesse Sans, MD   1 year ago Acute non-recurrent maxillary sinusitis   [place] Primary Care and Sports Medicine at [facility], Jesse Sans, MD       Future Appointments             In 5 months Army Melia, Jesse Sans, MD [place] Primary Care and Sports Medicine at [facility], [facility]

## 2022-07-02 ENCOUNTER — Ambulatory Visit: Payer: Medicare PPO

## 2022-07-11 ENCOUNTER — Other Ambulatory Visit: Payer: Self-pay | Admitting: Internal Medicine

## 2022-07-11 DIAGNOSIS — G2581 Restless legs syndrome: Secondary | ICD-10-CM

## 2022-07-11 DIAGNOSIS — M797 Fibromyalgia: Secondary | ICD-10-CM

## 2022-07-11 DIAGNOSIS — G3184 Mild cognitive impairment, so stated: Secondary | ICD-10-CM

## 2022-07-14 ENCOUNTER — Ambulatory Visit: Payer: Medicare PPO

## 2022-07-14 NOTE — Telephone Encounter (Signed)
Requested Prescriptions  Pending Prescriptions Disp Refills   metaxalone (SKELAXIN) 800 MG tablet [Pharmacy Med Name: METAXALONE 800 MG Tablet] 270 tablet 3    Sig: TAKE 1 TABLET THREE TIMES DAILY AS NEEDED FOR MUSCLE SPASM(S)     Not Delegated - Analgesics:  Muscle Relaxants Failed - 07/11/2022  6:44 PM      Failed - This refill cannot be delegated      Passed - Valid encounter within last 6 months    Recent Outpatient Visits           3 weeks ago [place] and Sports Medicine at [facility], Jesse Sans, MD   6 months ago Migraine without aura and responsive to treatment   [facility] and [facility] Medicine at [facility], Jesse Sans, MD   11 months ago Acute pain of both knees   [place] Primary Care and Sports Medicine at [facility], Jesse Sans, MD   1 year ago [place] Primary Care and Sports Medicine at [facility], Jesse Sans, MD   1 year ago Acute non-recurrent maxillary sinusitis   [place] Primary Care and Sports Medicine at [facility], Jesse Sans, MD       Future Appointments             In 5 months Army Melia, Jesse Sans, MD [facility] Primary Care and Sports Medicine at [place], [place]             memantine ([place]) 5 MG tablet [Pharmacy Med Name: MEMANTINE HYDROCHLORIDE 5 MG Tablet] 180 tablet 3    Sig: TAKE 1 TABLET TWICE DAILY     Neurology:  Alzheimer's Agents 2 Failed - 07/11/2022  6:44 PM      Failed - eGFR is 5 or above and within 360 days    EGFR (African American)  Date Value Ref Range Status  07/29/2012 >60  Final   GFR calc Af Amer  Date Value Ref Range Status  12/06/2014 68 >59 mL/min/1.73 Final   EGFR (Non-African Amer.)  Date Value Ref Range Status  07/29/2012 >60  Final    Comment:    eGFR values <19m/min/1.73 m2 may be an indication of chronic kidney disease (CKD). Calculated eGFR is useful in patients with stable  renal function. The eGFR calculation will not be reliable in acutely ill patients when serum creatinine is changing rapidly. It is not useful in  patients on dialysis. The eGFR calculation may not be applicable to patients at the low and high extremes of body sizes, pregnant women, and vegetarians.    GFR calc non Af Amer  Date Value Ref Range Status  04/19/2020 56  Final         Passed - Cr in normal range and within 360 days    Creatinine  Date Value Ref Range Status  09/18/2021 1.1 0.5 - 1.1 Final  07/29/2012 0.93 0.60 - 1.30 mg/dL Final   Creatinine, Ser  Date Value Ref Range Status  12/06/2014 1.00 0.57 - 1.00 mg/dL Final         Passed - Valid encounter within last 6 months    Recent Outpatient Visits           3 weeks [place] [facility]   6 months ago Migraine without aura and responsive to treatment  [place] Primary Care and Sports Medicine at [facility], Jesse Sans, MD   11 months ago Acute pain of both knees   [place] Primary Care and Sports Medicine at [facility], Jesse Sans, MD   1 year ago [place] Primary Care and Sports Medicine at [facility], Jesse Sans, MD   1 year ago Acute non-recurrent maxillary sinusitis   [place] Primary Care and Sports Medicine at [facility], Jesse Sans, MD       Future Appointments             In 5 months Army Melia, Jesse Sans, MD [facility] Health Primary Care and Sports Medicine at [place], [place]             busPIRone (BUSPAR) 10 MG tablet [Pharmacy Med Name: BUSPIRONE HYDROCHLORIDE 10 MG Tablet] 360 tablet 3    Sig: TAKE 1 TABLET FOUR TIMES DAILY     Psychiatry: Anxiolytics/Hypnotics - Non-controlled Passed - 07/11/2022  6:44 PM      Passed - Valid encounter within last 12 months    Recent Outpatient Visits           3 weeks ago [place] and Sports Medicine at [facility], Jesse Sans, MD   6 months ago Migraine without aura and responsive to treatment   [facility] at [facility], Jesse Sans, MD   11 months ago Acute pain of both knees   [place] Primary Care and Sports Medicine at [facility], Jesse Sans, MD   1 year ago [place] Primary Care and Sports Medicine at [facility], Jesse Sans, MD   1 year ago Acute non-recurrent maxillary sinusitis   [place] Primary Care and Sports Medicine at [facility], Jesse Sans, MD       Future Appointments             In 5 months Army Melia Jesse Sans, MD [facility] Health Primary Care and Sports Medicine at [place], [place]             rOPINIRole (REQUIP) 0.5 MG tablet [Pharmacy Med Name: ROPINIROLE HYDROCHLORIDE 0.5 MG Tablet] 630 tablet 3    Sig: TAKE 7 TABLETS AT BEDTIME     Neurology:  Parkinsonian Agents Passed - 07/11/2022  6:44 PM      Passed - Last BP in normal range    BP Readings from Last 1 Encounters:  06/19/22 110/78         Passed - Last Heart Rate in normal range    Pulse Readings from Last 1 Encounters:  06/19/22 (!) 54         Passed - Valid encounter within last 12 months    Recent Outpatient Visits           3 weeks ago [place] at [facility], Jesse Sans, MD   6 months ago Migraine without aura and responsive to treatment   [place] and Sports Medicine at [facility], Jesse Sans, MD   11 months ago Acute pain of both knees   [place] Primary Care and Sports Medicine at [facility], Jesse Sans, MD   1 year ago [place] and Sports Medicine at [facility], Jesse Sans, MD  1 year ago Acute non-recurrent maxillary sinusitis   [place] Primary Care and Sports Medicine at [facility], Jesse Sans, MD       Future Appointments             In 5 months Army Melia, Jesse Sans, MD [facility] Primary Care and Sports Medicine at [facility], [place]             furosemide (LASIX) 20 MG tablet [Pharmacy Med Name: FUROSEMIDE 20 MG Tablet] 90 tablet 3    Sig: TAKE 1 TABLET EVERY DAY (NEED MD APPOINTMENT)     Cardiovascular:  Diuretics - Loop Failed - 07/11/2022  6:44 PM      Failed - K in normal range and within 180 days    Potassium  Date Value Ref Range Status  09/18/2021 4.0 3.5 - 5.1 mEq/L Final  07/29/2012 4.1 3.5 - 5.1 mmol/L Final         Failed - Ca in normal range and within 180 days    Calcium  Date Value Ref Range Status  09/18/2021 10.2 8.7 - 10.7 Final   Calcium, Total  Date Value Ref Range Status  07/29/2012 9.1 8.5 - 10.1 mg/dL Final         Failed - Na in normal range and within 180 days    Sodium  Date Value Ref Range Status  09/18/2021 141 137 - 147 Final  07/29/2012 140 136 - 145 mmol/L Final         Failed - Cr in normal range and within 180 days    Creatinine  Date Value Ref Range Status  09/18/2021 1.1 0.5 - 1.1 Final  07/29/2012 0.93 0.60 - 1.30 mg/dL Final   Creatinine, Ser  Date Value Ref Range Status  12/06/2014 1.00 0.57 - 1.00 mg/dL Final         Failed - Cl in normal range and within 180 days    Chloride  Date Value Ref Range Status  09/18/2021 103 99 - 108 Final  07/29/2012 104 98 - 107 mmol/L Final         Failed - Mg Level in normal range and within 180 days    No results found for: "MG"       Passed - Last BP in normal range    BP Readings from Last 1 Encounters:  06/19/22 110/78         Passed - Valid encounter within last 6 months    Recent Outpatient Visits           3 weeks ago Fibromyalgia   [place] Primary Care and Sports Medicine at [facility], Jesse Sans, MD   6 months ago Migraine without aura and responsive to treatment   [place] Primary Care and Sports Medicine  at [facility], Jesse Sans, MD   11 months ago Acute pain of both knees   [place] Primary Care and Sports Medicine at [facility], Jesse Sans, MD   1 year ago [place] Primary Care and Sports Medicine at [facility], Jesse Sans, MD   1 year ago Acute non-recurrent maxillary sinusitis   [place] Primary Care and Sports Medicine at [facility], Jesse Sans, MD       Future Appointments             In 5 months Army Melia Jesse Sans, MD [facility] Primary Care and Sports Medicine at [facility],  [facility]

## 2022-07-14 NOTE — Telephone Encounter (Signed)
Requested medications are due for refill today.  yes  Requested medications are on the active medications list.  yes  Last refill. varies  Future visit scheduled.   yes  Notes to clinic.  Metaxalone is not delegated. Memantine has expired labs    Requested Prescriptions  Pending Prescriptions Disp Refills   metaxalone (SKELAXIN) 800 MG tablet [Pharmacy Med Name: METAXALONE 800 MG Tablet] 270 tablet 3    Sig: TAKE 1 TABLET THREE TIMES DAILY AS NEEDED FOR MUSCLE SPASM(S)     Not Delegated - Analgesics:  Muscle Relaxants Failed - 07/11/2022  6:44 PM      Failed - This refill cannot be delegated      Passed - Valid encounter within last 6 months    Recent Outpatient Visits           3 weeks ago [place] and Sports Medicine at [facility], Jesse Sans, MD   6 months ago Migraine without aura and responsive to treatment   [facility] Care and Sports Medicine at [facility], Jesse Sans, MD   11 months ago Acute pain of both knees   [place] Primary Care and Sports Medicine at [facility], Jesse Sans, MD   1 year ago [place] Primary Care and Sports Medicine at [facility], Jesse Sans, MD   1 year ago Acute non-recurrent maxillary sinusitis   [place] Primary Care and Sports Medicine at [facility], Jesse Sans, MD       Future Appointments             In 5 months Army Melia, Jesse Sans, MD [facility] Primary Care and Sports Medicine at [place], [place]             memantine ([place]) 5 MG tablet [Pharmacy Med Name: MEMANTINE HYDROCHLORIDE 5 MG Tablet] 180 tablet 3    Sig: TAKE 1 TABLET TWICE DAILY     Neurology:  Alzheimer's Agents 2 Failed - 07/11/2022  6:44 PM      Failed - eGFR is 5 or above and within 360 days    EGFR (African American)  Date Value Ref Range Status  07/29/2012 >60  Final   GFR calc Af Amer  Date Value Ref Range Status  12/06/2014 68  >59 mL/min/1.73 Final   EGFR (Non-African Amer.)  Date Value Ref Range Status  07/29/2012 >60  Final    Comment:    eGFR values <92m/min/1.73 m2 may be an indication of chronic kidney disease (CKD). Calculated eGFR is useful in patients with stable renal function. The eGFR calculation will not be reliable in acutely ill patients when serum creatinine is changing rapidly. It is not useful in  patients on dialysis. The eGFR calculation may not be applicable to patients at the low and high extremes of body sizes, pregnant women, and vegetarians.    GFR calc non Af Amer  Date Value Ref Range Status  04/19/2020 56  Final         Passed - Cr in normal range and within 360 days    Creatinine  Date Value Ref Range Status  09/18/2021 1.1 0.5 - 1.1 Final  07/29/2012 0.93 0.60 - 1.30 mg/dL Final   Creatinine, Ser  Date Value Ref Range Status  12/06/2014 1.00 0.57 - 1.00 mg/dL Final         Passed - Valid encounter within last 6 months  Recent Outpatient Visits           3 weeks ago Fibromyalgia   [place] Primary Care and Sports Medicine at [facility], Jesse Sans, MD   6 months ago Migraine without aura and responsive to treatment   [facility] Primary Care and Sports Medicine at [facility], Jesse Sans, MD   11 months ago Acute pain of both knees   [place] Primary Care and Sports Medicine at [facility], Jesse Sans, MD   1 year ago [place] Primary Care and Sports Medicine at [facility], Jesse Sans, MD   1 year ago Acute non-recurrent maxillary sinusitis   [place] Primary Care and Sports Medicine at [facility], Jesse Sans, MD       Future Appointments             In 5 months Army Melia Jesse Sans, MD [facility] Primary Care and Sports Medicine at [facility], [facility]            Signed Prescriptions Disp Refills   busPIRone (BUSPAR) 10 MG tablet 360 tablet 1    Sig: TAKE 1 TABLET  FOUR TIMES DAILY     Psychiatry: Anxiolytics/Hypnotics - Non-controlled Passed - 07/11/2022  6:44 PM      Passed - Valid encounter within last 12 months    Recent Outpatient Visits           3 weeks ago [place] Primary Care and Sports Medicine at [facility], Jesse Sans, MD   6 months ago Migraine without aura and responsive to treatment   [facility] at [facility], Jesse Sans, MD   11 months ago Acute pain of both knees   [place] Primary Care and Sports Medicine at [facility], Jesse Sans, MD   1 year ago [place] Primary Care and Sports Medicine at [facility], Jesse Sans, MD   1 year ago Acute non-recurrent maxillary sinusitis   [place] Primary Care and Sports Medicine at [facility], Jesse Sans, MD       Future Appointments             In 5 months Glean Hess, MD [facility] Primary Care and Sports Medicine at [facility], [facility]             rOPINIRole (REQUIP) 0.5 MG tablet 630 tablet 1    Sig: TAKE 7 TABLETS AT BEDTIME     Neurology:  Parkinsonian Agents Passed - 07/11/2022  6:44 PM      Passed - Last BP in normal range    BP Readings from Last 1 Encounters:  06/19/22 110/78         Passed - Last Heart Rate in normal range    Pulse Readings from Last 1 Encounters:  06/19/22 (!) 54         Passed - Valid encounter within last 12 months    Recent Outpatient Visits           3 weeks ago [place] at [facility], Jesse Sans, MD   6 months ago Migraine without aura and responsive to treatment   [place] and Sports Medicine at [facility], Jesse Sans, MD   11 months ago Acute pain of both knees   [place]  Primary Care and Sports Medicine at [facility] Jesse Sans, MD   1 year ago [place] Primary Care and  Sports Medicine at [facility], Jesse Sans, MD   1 year ago Acute non-recurrent maxillary sinusitis   [place] Primary Care and Sports Medicine at [facility], Jesse Sans, MD       Future Appointments             In 5 months Army Melia Jesse Sans, MD [facility] Health Primary Care and Sports Medicine at [facility], [facility]             furosemide (LASIX) 20 MG tablet 90 tablet 1    Sig: TAKE 1 TABLET EVERY DAY (NEED MD APPOINTMENT)     Cardiovascular:  Diuretics - Loop Failed - 07/11/2022  6:44 PM      Failed - K in normal range and within 180 days    Potassium  Date Value Ref Range Status  09/18/2021 4.0 3.5 - 5.1 mEq/L Final  07/29/2012 4.1 3.5 - 5.1 mmol/L Final         Failed - Ca in normal range and within 180 days    Calcium  Date Value Ref Range Status  09/18/2021 10.2 8.7 - 10.7 Final   Calcium, Total  Date Value Ref Range Status  07/29/2012 9.1 8.5 - 10.1 mg/dL Final         Failed - Na in normal range and within 180 days    Sodium  Date Value Ref Range Status  09/18/2021 141 137 - 147 Final  07/29/2012 140 136 - 145 mmol/L Final         Failed - Cr in normal range and within 180 days    Creatinine  Date Value Ref Range Status  09/18/2021 1.1 0.5 - 1.1 Final  07/29/2012 0.93 0.60 - 1.30 mg/dL Final   Creatinine, Ser  Date Value Ref Range Status  12/06/2014 1.00 0.57 - 1.00 mg/dL Final         Failed - Cl in normal range and within 180 days    Chloride  Date Value Ref Range Status  09/18/2021 103 99 - 108 Final  07/29/2012 104 98 - 107 mmol/L Final         Failed - Mg Level in normal range and within 180 days    No results found for: "MG"       Passed - Last BP in normal range    BP Readings from Last 1 Encounters:  06/19/22 110/78         Passed - Valid encounter within last 6 months    Recent Outpatient Visits           3 weeks ago [place] and Sports Medicine at [facility], Jesse Sans, MD   6 months ago Migraine without aura and responsive to treatment   [place] Primary Care and Sports Medicine at [facility], Jesse Sans, MD   11 months ago Acute pain of both knees   [place] Primary Care and Sports Medicine at [facility], Jesse Sans, MD   1 year ago [place] and Sports Medicine at [facility], Jesse Sans, MD   1 year ago Acute non-recurrent maxillary sinusitis   [place] Primary Care and Sports Medicine at [facility], Jesse Sans, MD       Future Appointments  In 5 months Army Melia, Jesse Sans, MD [place] Primary Care and Sports Medicine at [facility], [facility]

## 2022-07-18 ENCOUNTER — Ambulatory Visit (INDEPENDENT_AMBULATORY_CARE_PROVIDER_SITE_OTHER): Payer: Medicare PPO

## 2022-07-18 DIAGNOSIS — Z0000 Encounter for general adult medical examination without abnormal findings: Secondary | ICD-10-CM | POA: Diagnosis not present

## 2022-07-18 NOTE — Patient Instructions (Signed)

## 2022-07-18 NOTE — Progress Notes (Signed)
I connected with  Franziska Podgurski Aerts on 07/18/22 by a audio enabled telemedicine application and verified that I am speaking with the correct person using two identifiers.  Patient Location: Home  Provider Location: Office/Clinic  I discussed the limitations of evaluation and management by telemedicine. The patient expressed understanding and agreed to proceed.   Subjective:   Sandra Mckinney is a 77 y.o. female who presents for Medicare Annual (Subsequent) preventive examination.  Review of Systems    Per HPI unless specifically indicated below.  Cardiac Risk Factors include: advanced age (>68mn, >>56women);female gender,and Hyperlipidemia.         Objective:       06/19/2022    9:04 AM 12/18/2021   10:00 AM 07/26/2021    1:15 PM  Vitals with BMI  Height '5\' 2"'$  '5\' 2"'$  '5\' 2"'$   Weight 176 lbs 169 lbs 13 oz 179 lbs  BMI 32.18 [phone] Systolic [id number] Diastolic 78 80 82  Pulse 54 53 62    Today's Vitals   07/18/22 0936  PainSc: 4    There is no height or weight on file to calculate BMI.     07/18/2022   10:36 AM 06/26/2021    8:51 AM 06/25/2020    9:04 AM 04/18/2019    9:47 AM 04/14/2018    3:13 PM 04/13/2017    8:26 AM 04/16/2016    9:30 AM  Advanced Directives  Does Patient Have a Medical Advance Directive? Yes Yes Yes Yes Yes Yes No;Yes  Type of Advance Directive Living will;Healthcare Power of [place]verne Park Oaks[place] will [place] will Living will;Healthcare Power of [place]eds[place] will [place] will   Does patient want to make changes to medical advance directive? No - Patient declined        Copy of [place] Chart? No - copy requested No - copy requested No - copy requested No - copy requested No - copy requested No - copy requested     Current Medications (verified) Outpatient Encounter Medications as of  07/18/2022  Medication Sig   aspirin-acetaminophen-caffeine (EXCEDRIN MIGRAINE) 250-250-65 MG tablet Take by mouth every 6 (six) hours as needed for headache.   busPIRone (BUSPAR) 10 MG tablet TAKE 1 TABLET FOUR TIMES DAILY   Calcium-Magnesium-Vitamin D 600-40-500 MG-MG-UNIT TB24 Take 1 tablet by mouth daily.   chlorpheniramine (CHLOR-TRIMETON) 4 MG tablet Take 4 mg by mouth daily.   cholecalciferol (VITAMIN D) 1000 UNITS tablet Take 1 tablet by mouth daily.   diclofenac Sodium (VOLTAREN) 1 % GEL Apply topically 4 (four) times daily.   ferrous sulfate 325 (65 FE) MG tablet Take 325 mg by mouth daily with breakfast.   furosemide (LASIX) 20 MG tablet TAKE 1 TABLET EVERY DAY (NEED MD APPOINTMENT)   gabapentin (NEURONTIN) 100 MG capsule TAKE 1 CAPSULE THREE TIMES DAILY (Patient taking differently: Nerve Pain, Fibromyalgia)   glucosamine-chondroitin 500-400 MG tablet Take 1 tablet by mouth 2 (two) times daily.   HYDROcodone-acetaminophen (NORCO/VICODIN) 5-325 MG tablet Take 1 tablet by mouth every 6 (six) hours as needed for moderate pain. Fibromyalgia.   lamoTRIgine (LAMICTAL) 25 MG tablet Take 50 mg by mouth in the morning and at bedtime.   memantine (NAMENDA) 5 MG tablet TAKE 1 TABLET TWICE DAILY   metaxalone (SKELAXIN) 800 MG tablet TAKE 1 TABLET THREE TIMES DAILY AS NEEDED FOR MUSCLE SPASM(S)   MULTIPLE VITAMINS-MINERALS PO Take 1  tablet by mouth daily.   propranolol ER (INDERAL LA) 60 MG 24 hr capsule TAKE 1 CAPSULE EVERY DAY   rOPINIRole (REQUIP) 0.5 MG tablet TAKE 7 TABLETS AT BEDTIME   traZODone (DESYREL) 50 MG tablet Take 50 mg by mouth at bedtime.   donepezil (ARICEPT) 10 MG tablet Take by mouth.   No facility-administered encounter medications on file as of 07/18/2022.    Allergies (verified) Cephalexin, Letrozole, Raloxifene, Selective estrogen receptor modulators, Citalopram, Cephalosporins, Duloxetine hcl, Exemestane, Lyrica [pregabalin], Oxycodone, Penicillins, Prilosec   [omeprazole], and Sulfa antibiotics   History: Past Medical History:  Diagnosis Date   Breast cancer ([place]) 0300   Complication of anesthesia    2007.  Med injected into vein instead of muscle.  Heart stopped.  3 "shocks" to restart.  3 grand mal seizures after heart restarted.   Fibromyalgia    Hyperlipidemia    Major depressive disorder    Migraine headache    only 3-4 per year since starting propranolol   Restless leg syndrome    Seizures ([place]) 2007   Med was injected into vein instead of muscle.  Heart stopped.  3 shocks to restart heart.  3 grand mal seizures after heart restarted.   Vitamin D deficiency    Past Surgical History:  Procedure Laterality Date   ABDOMINAL HYSTERECTOMY  1986   uterine fibroids and endometriosis   BREAST BIOPSY Right 06/01/12   [place] bx/clip-neg   BREAST IMPLANT REMOVAL Bilateral 10/19/2019   BREAST RECONSTRUCTION Bilateral 2017   BREAST REDUCTION SURGERY  10/19/2019   all breast reconstructions removed   CARPAL TUNNEL RELEASE Right 2014   EYE SURGERY Bilateral 2009   catarcts   MASTECTOMY, RADICAL Bilateral 2016   REDUCTION MAMMAPLASTY     2001   THUMB ARTHROSCOPY Right 2014   Family History  Problem Relation Age of Onset   Dementia Mother    Hypertension Mother    Heart disease Father 106   Social History   Socioeconomic History   Marital status: Married    Spouse name: Darlene Brozowski   Number of children: 1   Years of education: some college   Highest education level: 12th grade  Occupational History   Occupation: Retired  Tobacco Use   Smoking status: Former    Packs/day: 0.75    Years: 10.00    Total pack years: 7.50    Types: Cigarettes    Quit date: 04/13/1974    Years since quitting: 48.2   Smokeless tobacco: Never   Tobacco comments:    smoking cessation materials not required  Vaping Use   Vaping Use: Never used  Substance and Sexual Activity   Alcohol use: No    Alcohol/week: 0.0 standard drinks of alcohol    Drug use: No   Sexual activity: Not Currently  Other Topics Concern   Not on file  Social History Narrative   Not on file   Social Determinants of Health   Financial Resource Strain: Low Risk  (07/18/2022)   Overall Financial Resource Strain (CARDIA)    Difficulty of Paying Living Expenses: Not hard at all  Food Insecurity: No Food Insecurity (07/18/2022)   Hunger Vital Sign    Worried About Running Out of Food in the Last Year: Never true    Ran Out of Food in the Last Year: Never true  Transportation Needs: No Transportation Needs (07/18/2022)   PRAPARE - Hydrologist (Medical): No    Lack of Transportation (  Non-Medical): No  Physical Activity: Insufficiently Active (07/18/2022)   Exercise Vital Sign    Days of Exercise per Week: 3 days    Minutes of Exercise per Session: 20 min  Stress: No Stress Concern Present (07/18/2022)   [place]    Feeling of Stress : Not at all  Social Connections: Moderately Isolated (07/18/2022)   Social Connection and Isolation Panel [NHANES]    Frequency of Communication with Friends and Family: More than three times a week    Frequency of Social Gatherings with Friends and Family: Once a week    Attends Religious Services: Never    Marine scientist or Organizations: No    Attends Music therapist: Never    Marital Status: Married    Tobacco Counseling Counseling given: Not Answered Tobacco comments: smoking cessation materials not required   Clinical Intake:  Pre-visit preparation completed: No  Pain : 0-10 Pain Score: 4  Pain Type: Chronic pain Pain Location: Back Pain Descriptors / Indicators: Aching Pain Onset: More than a month ago Pain Frequency: Constant     Nutritional Status: BMI of 19-24  Normal Nutritional Risks: Unintentional weight gain Diabetes: No  How often do you need to have someone help you when  you read instructions, pamphlets, or other written materials from your doctor or pharmacy?: 1 - Never  Diabetic?No     Information entered by :: Donnie Mesa, CMA   Activities of Daily Living    07/18/2022    9:34 AM 07/26/2021    1:31 PM  In your present state of health, do you have any difficulty performing the following activities:  Hearing? 0 0  Vision? 0 0  Difficulty concentrating or making decisions? 0 1  Walking or climbing stairs? 0 0  Dressing or bathing? 0 0  Doing errands, shopping? 0 0    Patient Care Team: Glean Hess, MD as PCP - General (Family Medicine) Anabel Bene, MD as Referring Physician (Neurology) Long, Deretha Emory, MD as Referring Physician (Geriatric Medicine)  Indicate any recent Medical Services you may have received from other than [facility] providers in the past year (date may be approximate).  The patient was seen by [facility] on 06/04/22 for mild cognitive impairment.     Assessment:   This is a routine wellness examination for [facility].  Hearing/Vision screen   This is a routine wellness examination for [facility].  Hearing/Vision screen Hearing Screening - Comments:: Pt denies hearing difficulty but c/o ringing in ears due to sinus issues  Vision Screening - Comments:: [facility] vision screenings at [facility]; due for exam     Dietary issues and exercise activities discussed: Current Exercise Habits: Home exercise routine, Type of exercise: walking, Time (Minutes): 20, Frequency (Times/Week): 3, Weekly Exercise (Minutes/Week): 60, Intensity: Mild, Exercise limited by: None identified   Goals Addressed   None    Depression Screen    07/18/2022    9:33 AM 06/19/2022    9:14 AM 12/18/2021   10:13 AM 07/26/2021    1:30 PM 06/26/2021    8:49 AM 06/19/2021   10:57 AM 12/14/2020    2:20 PM  PHQ 2/9 Scores  PHQ - 2 Score 0 4 0 0 0 0 0  PHQ- 9 Score '2 6 4 6 3 3 8    '$ Fall Risk    07/18/2022    9:34 AM 06/19/2022     9:15 AM 12/18/2021  10:14 AM 07/26/2021    1:31 PM 06/26/2021    8:55 AM  Fall Risk   Falls in the past year? 0 0 0 0 0  Number falls in past yr: 0 0 0 0 0  Injury with Fall? 0 0 0 0 0  Risk for fall due to : No Fall Risks No Fall Risks No Fall Risks No Fall Risks No Fall Risks  Follow up Falls evaluation completed Falls evaluation completed Falls evaluation completed Falls evaluation completed Falls prevention discussed    FALL RISK PREVENTION PERTAINING TO THE HOME:  Any stairs in or around the home? Yes  If so, are there any without handrails? No  Home free of loose throw rugs in walkways, pet beds, electrical cords, etc? Yes  Adequate lighting in your home to reduce risk of falls? Yes   ASSISTIVE DEVICES UTILIZED TO PREVENT FALLS:  Life alert? No  Use of a cane, walker or w/c? No  Grab bars in the bathroom? No  Shower chair or bench in shower? Yes  Elevated toilet seat or a handicapped toilet? No   TIMED UP AND GO:  Was the test performed?  not performed, virtual appt .  Cognitive Function:        07/18/2022    9:35 AM 01/11/2020    8:19 AM 04/18/2019    9:54 AM 04/14/2018    3:02 PM 04/16/2016    9:31 AM  6CIT Screen  What Year? 0 points 0 points 0 points 0 points 0 points  What month? 0 points 0 points 0 points 0 points 0 points  What time? 0 points 0 points 0 points 0 points 0 points  Count back from 20 0 points 0 points 0 points 0 points 0 points  Months in reverse 0 points 0 points 0 points 0 points 0 points  Repeat phrase 0 points 0 points 0 points 0 points 0 points  Total Score 0 points 0 points 0 points 0 points 0 points    Immunizations Immunization History  Administered Date(s) Administered   COVID-19, mRNA, vaccine(Comirnaty)12 years and older 04/18/2022   Fluad Quad(high Dose 65+) 03/22/2022   Influenza, High Dose Seasonal PF 03/05/2017, 03/11/2018, 04/11/2019, 03/18/2020, 03/26/2021   Influenza-Unspecified 03/07/2014, 02/03/2017, 04/11/2019    PFIZER(Purple Top)SARS-COV-2 Vaccination 08/19/2019, 09/09/2019, 04/05/2020, 10/22/2020   Pfizer Covid-19 Vaccine Bivalent Booster 93yr & up 03/15/2021   Pneumococcal Conjugate-13 03/14/2014, 09/17/2015   Pneumococcal Polysaccharide-23 04/18/2002, 11/07/2010, 04/14/2018   Tdap 11/24/2011   Zoster Recombinat (Shingrix) 09/16/2021, 11/19/2021   Zoster, Live 07/07/2008, 10/09/2014    TDAP status: Due, Education has been provided regarding the importance of this vaccine. Advised may receive this vaccine at local pharmacy or Health Dept. Aware to provide a copy of the vaccination record if obtained from local pharmacy or Health Dept. Verbalized acceptance and understanding.  Flu Vaccine status: Up to date  Pneumococcal vaccine status: Up to date  Covid-19 vaccine status: Information provided on how to obtain vaccines.   Qualifies for Shingles Vaccine? Yes   Zostavax completed Yes   Shingrix Completed?: Yes  Screening Tests Health Maintenance  Topic Date Due   DTaP/Tdap/Td (2 - Td or Tdap) 11/23/2021   COVID-19 Vaccine (7 - 2023-24 season) 06/13/2022   Fecal DNA (Cologuard)  04/03/2023   Medicare Annual Wellness (AWV)  07/19/2023   Pneumonia Vaccine 75 Years old  Completed   INFLUENZA VACCINE  Completed   DEXA SCAN  Completed   Zoster Vaccines- Shingrix  Completed  Hepatitis C Screening  Addressed   HPV VACCINES  Aged Out   COLONOSCOPY (Pts 45-77yr Insurance coverage will need to be confirmed)  Discontinued    Health Maintenance  Health Maintenance Due  Topic Date Due   DTaP/Tdap/Td (2 - Td or Tdap) 11/23/2021   COVID-19 Vaccine (7 - 2023-24 season) 06/13/2022    Colorectal cancer screening: Type of screening: Colonoscopy. Completed 10/11/2014. Repeat every 10 years  Mammogram status: No longer required due to age .  DEXA Scan: 07/17/2018  Lung Cancer Screening: (Low Dose CT Chest recommended if Age 411-80years, 30 pack-year currently smoking OR have quit w/in  15years.) does not qualify.   Lung Cancer Screening Referral: not applicable   Additional Screening:  Hepatitis C Screening: does qualify; Completed 01/09/2015  Vision Screening: Recommended annual ophthalmology exams for early detection of glaucoma and other disorders of the eye. Is the patient up to date with their annual eye exam?  Yes  Who is the provider or what is the name of the office in which the patient attends annual [facility] exams? [facility] If pt is not established with a provider, would they like to be referred to a provider to establish care? No .   Dental Screening: Recommended annual dental exams for proper oral hygiene  Community Resource Referral / Chronic Care Management: CRR required this visit?  No   CCM required this visit?  No      Plan:     I have personally reviewed and noted the following in the patient's chart:   Medical and social history Use of alcohol, tobacco or illicit drugs  Current medications and supplements including opioid prescriptions. Patient is currently taking opioid prescriptions. Information provided to patient regarding non-opioid alternatives. Patient advised to discuss non-opioid treatment plan with their provider. Functional ability and status Nutritional status Physical activity Advanced directives List of other physicians Hospitalizations, surgeries, and ER visits in previous 12 months Vitals Screenings to include cognitive, depression, and falls Referrals and appointments  In addition, I have reviewed and discussed with patient certain preventive protocols, quality metrics, and best practice recommendations. A written personalized care plan for preventive services as well as general preventive health recommendations were provided to patient.     Ms. BMclouth, Thank you for taking time to come for your Medicare Wellness Visit. I appreciate your ongoing commitment to your health goals. Please review the following  plan we discussed and let me know if I can assist you in the future.   These are the goals we discussed:  Goals      DIET - EAT MORE FRUITS AND VEGETABLES     Recommend to eat 3 small healthy meals and at least 2 healthy snacks per day.     Happiness     Continue to be positive and keep family and herself happy while keeping grand kids and staying active.      Increase water intake     Recommend to cut out sodas and replace with a minimum of 6-8 glasses per day.        This is a list of the screening recommended for you and due dates:  Health Maintenance  Topic Date Due   DTaP/Tdap/Td vaccine (2 - Td or Tdap) 11/23/2021   COVID-19 Vaccine (7 - 2023-24 season) 06/13/2022   Cologuard (Stool DNA test)  04/03/2023   Medicare Annual Wellness Visit  07/19/2023   Pneumonia Vaccine  Completed   Flu Shot  Completed  DEXA scan (bone density measurement)  Completed   Zoster (Shingles) Vaccine  Completed   Hepatitis C Screening: USPSTF Recommendation to screen - Ages 57-79 yo.  Addressed   HPV Vaccine  Aged Out   Colon Cancer Screening  Discontinued    Wilson Singer, [facility]   07/18/2022   Nurse Notes: Approximately 30 minute Non-Face -To-Face Medicare Wellness Visit

## 2022-07-28 ENCOUNTER — Telehealth: Payer: Self-pay

## 2022-07-28 NOTE — Telephone Encounter (Signed)
PA completed waiting for insurance approval.  Key: [id number]  KP  Information regarding your request Available without authorization.

## 2022-07-31 ENCOUNTER — Other Ambulatory Visit: Payer: Self-pay | Admitting: Internal Medicine

## 2022-07-31 DIAGNOSIS — M797 Fibromyalgia: Secondary | ICD-10-CM

## 2022-07-31 NOTE — Telephone Encounter (Signed)
Please review. Last office visit 06/19/2022.  KP

## 2022-07-31 NOTE — Telephone Encounter (Signed)
Requested medication (s) are due for refill today: yes  Requested medication (s) are on the active medication list: yes  Last refill:  07/01/22 #90/0  Future visit scheduled: yes  Notes to clinic:  Unable to refill per protocol, cannot delegate.    Requested Prescriptions  Pending Prescriptions Disp Refills   HYDROcodone-acetaminophen (NORCO/VICODIN) 5-325 MG tablet 90 tablet 0    Sig: Take 1 tablet by mouth every 6 (six) hours as needed for moderate pain. Fibromyalgia.     Not Delegated - Analgesics:  Opioid Agonist Combinations Failed - 07/31/2022 10:27 AM      Failed - This refill cannot be delegated      Failed - Urine Drug Screen completed in last 360 days      Passed - Valid encounter within last 3 months    Recent Outpatient Visits           1 month ago [place] at [facility], Jesse Sans, MD   7 months ago Migraine without aura and responsive to treatment   [place] at [facility], Jesse Sans, MD   1 year ago Acute pain of both knees   [facility] at [facility], Jesse Sans, MD   1 year ago [place] at [facility], Jesse Sans, MD   1 year ago Acute non-recurrent maxillary sinusitis   [place] [facility] at [facility], Jesse Sans, MD       Future Appointments             In 4 months [facility] Melia, Jesse Sans, MD [place] at [facility], [facility]

## 2022-07-31 NOTE — Telephone Encounter (Signed)
Medication Refill - Medication:   Disp Refills Start End   HYDROcodone-acetaminophen (NORCO/VICODIN) 5-325 MG tablet         Has the patient contacted their pharmacy? Yes.   (Agent: If no, request that the patient contact the pharmacy for the refill. If patient does not wish to contact the pharmacy document the reason why and proceed with request.) (Agent: If yes, when and what did the pharmacy advise?) call pcp  Preferred Pharmacy (with phone number or street name):  [id number] [address] [place]: [phone] Fax: [phone]    Has the patient been seen for an appointment in the last year OR does the patient have an upcoming appointment? Yes.    Agent: Please be advised that RX refills may take up to 3 business days. We ask that you follow-up with your pharmacy.

## 2022-08-01 MED ORDER — HYDROCODONE-ACETAMINOPHEN 5-325 MG PO TABS: 1.0000 | ORAL_TABLET | Freq: Four times a day (QID) | ORAL | 0 refills | Status: DC | PRN

## 2022-08-12 ENCOUNTER — Encounter: Payer: Self-pay | Admitting: Internal Medicine

## 2022-08-13 ENCOUNTER — Telehealth: Payer: Self-pay

## 2022-08-13 NOTE — Telephone Encounter (Signed)
Completed PA on [email or web address] for buspirone.  (KeyEthelene Hal) PA Case ID #: [id number]  Awaiting outcome.

## 2022-08-14 NOTE — Telephone Encounter (Signed)
Approved until 07/07/2023  Sent pt message on Mychart.  KP

## 2022-08-27 DIAGNOSIS — Z170 Estrogen receptor positive status [ER+]: Secondary | ICD-10-CM | POA: Diagnosis not present

## 2022-08-27 DIAGNOSIS — M8580 Other specified disorders of bone density and structure, unspecified site: Secondary | ICD-10-CM | POA: Diagnosis not present

## 2022-08-27 DIAGNOSIS — C50412 Malignant neoplasm of upper-outer quadrant of left female breast: Secondary | ICD-10-CM | POA: Diagnosis not present

## 2022-09-01 ENCOUNTER — Encounter: Payer: Self-pay | Admitting: Internal Medicine

## 2022-09-01 ENCOUNTER — Other Ambulatory Visit: Payer: Self-pay | Admitting: Internal Medicine

## 2022-09-01 DIAGNOSIS — M797 Fibromyalgia: Secondary | ICD-10-CM

## 2022-09-01 MED ORDER — HYDROCODONE-ACETAMINOPHEN 5-325 MG PO TABS: 1.0000 | ORAL_TABLET | Freq: Four times a day (QID) | ORAL | 0 refills | Status: DC | PRN

## 2022-09-01 NOTE — Telephone Encounter (Signed)
Please review.  KP

## 2022-09-03 ENCOUNTER — Encounter: Payer: Self-pay | Admitting: Internal Medicine

## 2022-09-03 ENCOUNTER — Other Ambulatory Visit: Payer: Self-pay | Admitting: Internal Medicine

## 2022-09-03 DIAGNOSIS — M797 Fibromyalgia: Secondary | ICD-10-CM

## 2022-09-03 MED ORDER — GABAPENTIN 100 MG PO CAPS: 100.0000 mg | ORAL_CAPSULE | Freq: Three times a day (TID) | ORAL | 1 refills | Status: DC

## 2022-10-13 ENCOUNTER — Other Ambulatory Visit: Payer: Self-pay | Admitting: Internal Medicine

## 2022-10-13 DIAGNOSIS — G3184 Mild cognitive impairment, so stated: Secondary | ICD-10-CM

## 2022-10-14 NOTE — Telephone Encounter (Signed)
Requested Prescriptions  Pending Prescriptions Disp Refills   propranolol ER (INDERAL LA) 60 MG 24 hr capsule [Pharmacy Med Name: PROPRANOLOL HYDROCHLORIDEER 60 MG Capsule Extended Release 24 Hour] 90 capsule 0    Sig: TAKE 1 CAPSULE EVERY DAY     Cardiovascular:  Beta Blockers Passed - 10/13/2022  5:03 PM      Passed - Last BP in normal range    BP Readings from Last 1 Encounters:  06/19/22 110/78         Passed - Last Heart Rate in normal range    Pulse Readings from Last 1 Encounters:  06/19/22 (!) 54         Passed - Valid encounter within last 6 months    Recent Outpatient Visits           3 months ago Fibromyalgia   [place] [facility], Nyoka CowdenLaura H, MD   10 months ago Migraine without aura and responsive to treatment   [facility], Nyoka CowdenLaura H, MD   1 year ago Acute pain of both knees   [place] [facility], Nyoka CowdenLaura H, MD   1 year ago Fibromyalgia   [facility], Nyoka CowdenLaura H, MD   1 year ago Acute non-recurrent maxillary sinusitis   [place] [facility], Nyoka CowdenLaura H, MD       Future Appointments             In 2 months Judithann GravesBerglund, Nyoka CowdenLaura H, MD [facility]             memantine (NAMENDA) 5 MG tablet [Pharmacy Med Name: MEMANTINE HYDROCHLORIDE 5 MG Tablet] 180 tablet 3    Sig: TAKE 1 TABLET TWICE DAILY     Neurology:  Alzheimer's Agents 2 Failed - 10/13/2022  5:03 PM      Failed - Cr in normal range and within 360 days    Creatinine  Date Value Ref Range Status  09/18/2021 1.1 0.5 - 1.1 Final  07/29/2012 0.93 0.60 - 1.30 mg/dL Final   Creatinine, Ser  Date Value Ref Range Status  12/06/2014 1.00 0.57 - 1.00 mg/dL Final         Failed - eGFR is 5 or above  and within 360 days    EGFR (African American)  Date Value Ref Range Status  07/29/2012 >60  Final   GFR calc Af Amer  Date Value Ref Range Status  12/06/2014 68 >59 mL/min/1.73 Final   EGFR (Non-African Amer.)  Date Value Ref Range Status  07/29/2012 >60  Final    Comment:    eGFR values <3760mL/min/1.73 m2 may be an indication of chronic kidney disease (CKD). Calculated eGFR is useful in patients with stable renal function. The eGFR calculation will not be reliable in acutely ill patients when serum creatinine is changing rapidly. It is not useful in  patients on dialysis. The eGFR calculation may not be applicable to patients at the low and high extremes of body sizes, pregnant women, and vegetarians.    GFR calc non Af Amer  Date Value Ref Range Status  04/19/2020 56  Final         Passed - Valid encounter within last 6 months    Recent Outpatient Visits  3 months ago Fibromyalgia   [place] [facility] at [facility], Nyoka Cowden, MD   10 months ago Migraine without aura and responsive to treatment   [facility] at [facility], Nyoka Cowden, MD   1 year ago Acute pain of both knees   [facility] at [facility], Nyoka Cowden, MD   1 year ago Fibromyalgia   [facility] at [facility], Nyoka Cowden, MD   1 year ago Acute non-recurrent maxillary sinusitis   [facility], Nyoka Cowden, MD       Future Appointments             In 2 months Judithann Graves, Nyoka Cowden, MD [facility] at [facility], [facility]

## 2022-10-14 NOTE — Telephone Encounter (Signed)
Requested medications are due for refill today.  yes  Requested medications are on the active medications list.  yes  Last refill. 07/14/2022 #180 0 rf  Future visit scheduled.   yes  Notes to clinic.  Expired eGFR labs.    Requested Prescriptions  Pending Prescriptions Disp Refills   memantine (NAMENDA) 5 MG tablet [Pharmacy Med Name: MEMANTINE HYDROCHLORIDE 5 MG Tablet] 180 tablet 3    Sig: TAKE 1 TABLET TWICE DAILY     Neurology:  Alzheimer's Agents 2 Failed - 10/13/2022  5:03 PM      Failed - Cr in normal range and within 360 days    Creatinine  Date Value Ref Range Status  09/18/2021 1.1 0.5 - 1.1 Final  07/29/2012 0.93 0.60 - 1.30 mg/dL Final   Creatinine, Ser  Date Value Ref Range Status  12/06/2014 1.00 0.57 - 1.00 mg/dL Final         Failed - eGFR is 5 or above and within 360 days    EGFR (African American)  Date Value Ref Range Status  07/29/2012 >60  Final   GFR calc Af Amer  Date Value Ref Range Status  12/06/2014 68 >59 mL/min/1.73 Final   EGFR (Non-African Amer.)  Date Value Ref Range Status  07/29/2012 >60  Final    Comment:    eGFR values <55mL/min/1.73 m2 may be an indication of chronic kidney disease (CKD). Calculated eGFR is useful in patients with stable renal function. The eGFR calculation will not be reliable in acutely ill patients when serum creatinine is changing rapidly. It is not useful in  patients on dialysis. The eGFR calculation may not be applicable to patients at the low and high extremes of body sizes, pregnant women, and vegetarians.    GFR calc non Af Amer  Date Value Ref Range Status  04/19/2020 56  Final         Passed - Valid encounter within last 6 months    Recent Outpatient Visits           3 months ago Fibromyalgia   [place] [facility] at [facility], Nyoka Cowden, MD   10 months ago Migraine without aura and responsive to treatment   [facility] Medicine at  [facility], Nyoka Cowden, MD   1 year ago Acute pain of both knees   [place] [facility] at [facility], Nyoka Cowden, MD   1 year ago Fibromyalgia   [facility] at [facility], Nyoka Cowden, MD   1 year ago Acute non-recurrent maxillary sinusitis   [place] [facility] at [facility], Nyoka Cowden, MD       Future Appointments             In 2 months Judithann Graves Nyoka Cowden, MD [facility] Primary Care & Sports Medicine at [facility], [facility]            Signed Prescriptions Disp Refills   propranolol ER (INDERAL LA) 60 MG 24 hr capsule 90 capsule 0    Sig: TAKE 1 CAPSULE EVERY DAY     Cardiovascular:  Beta Blockers Passed - 10/13/2022  5:03 PM      Passed - Last BP in normal range    BP Readings from Last 1 Encounters:  06/19/22 110/78         Passed - Last Heart Rate  in normal range    Pulse Readings from Last 1 Encounters:  06/19/22 (!) 54         Passed - Valid encounter within last 6 months    Recent Outpatient Visits           3 months ago Fibromyalgia   [place] [facility] at [facility], Nyoka Cowden, MD   10 months ago Migraine without aura and responsive to treatment   [facility] at [facility], Nyoka Cowden, MD   1 year ago Acute pain of both knees   [facility] Primary Care & Sports Medicine at [facility], Nyoka Cowden, MD   1 year ago Fibromyalgia   [facility] Medicine at [facility], Nyoka Cowden, MD   1 year ago Acute non-recurrent maxillary sinusitis   [place] [facility] at [facility], Nyoka Cowden, MD       Future Appointments             In 2 months Judithann Graves, Nyoka Cowden, MD [facility] Primary Care & Sports Medicine at [facility], [facility]

## 2022-11-03 ENCOUNTER — Encounter: Payer: Self-pay | Admitting: Internal Medicine

## 2022-11-03 ENCOUNTER — Other Ambulatory Visit: Payer: Self-pay | Admitting: Internal Medicine

## 2022-11-03 DIAGNOSIS — M797 Fibromyalgia: Secondary | ICD-10-CM

## 2022-11-03 MED ORDER — HYDROCODONE-ACETAMINOPHEN 5-325 MG PO TABS: 1.0000 | ORAL_TABLET | Freq: Four times a day (QID) | ORAL | 0 refills | Status: DC | PRN

## 2022-11-03 NOTE — Telephone Encounter (Signed)
Please review.  KP

## 2022-11-11 DIAGNOSIS — L821 Other seborrheic keratosis: Secondary | ICD-10-CM | POA: Diagnosis not present

## 2022-11-11 DIAGNOSIS — L298 Other pruritus: Secondary | ICD-10-CM | POA: Diagnosis not present

## 2022-11-28 DIAGNOSIS — F3181 Bipolar II disorder: Secondary | ICD-10-CM | POA: Diagnosis not present

## 2022-11-28 DIAGNOSIS — G3184 Mild cognitive impairment, so stated: Secondary | ICD-10-CM | POA: Diagnosis not present

## 2022-11-28 DIAGNOSIS — F5101 Primary insomnia: Secondary | ICD-10-CM | POA: Diagnosis not present

## 2022-12-01 ENCOUNTER — Encounter: Payer: Self-pay | Admitting: Internal Medicine

## 2022-12-02 ENCOUNTER — Other Ambulatory Visit: Payer: Self-pay | Admitting: Internal Medicine

## 2022-12-02 DIAGNOSIS — M797 Fibromyalgia: Secondary | ICD-10-CM

## 2022-12-02 MED ORDER — HYDROCODONE-ACETAMINOPHEN 5-325 MG PO TABS: 1.0000 | ORAL_TABLET | Freq: Four times a day (QID) | ORAL | 0 refills | Status: DC | PRN

## 2022-12-05 ENCOUNTER — Encounter: Payer: Self-pay | Admitting: Internal Medicine

## 2022-12-08 ENCOUNTER — Other Ambulatory Visit: Payer: Self-pay | Admitting: Internal Medicine

## 2022-12-08 NOTE — Telephone Encounter (Signed)
Please review.  KP

## 2022-12-19 ENCOUNTER — Encounter: Payer: Medicare PPO | Admitting: Internal Medicine

## 2022-12-23 ENCOUNTER — Encounter: Payer: Self-pay | Admitting: Internal Medicine

## 2022-12-23 ENCOUNTER — Ambulatory Visit (INDEPENDENT_AMBULATORY_CARE_PROVIDER_SITE_OTHER): Payer: Medicare PPO | Admitting: Internal Medicine

## 2022-12-23 VITALS — BP 126/82 | HR 61 | Ht 62.0 in | Wt 177.0 lb

## 2022-12-23 DIAGNOSIS — Z0000 Encounter for general adult medical examination without abnormal findings: Secondary | ICD-10-CM

## 2022-12-23 DIAGNOSIS — E782 Mixed hyperlipidemia: Secondary | ICD-10-CM

## 2022-12-23 DIAGNOSIS — F3181 Bipolar II disorder: Secondary | ICD-10-CM

## 2022-12-23 DIAGNOSIS — F411 Generalized anxiety disorder: Secondary | ICD-10-CM

## 2022-12-23 DIAGNOSIS — Z23 Encounter for immunization: Secondary | ICD-10-CM | POA: Diagnosis not present

## 2022-12-23 DIAGNOSIS — Z1159 Encounter for screening for other viral diseases: Secondary | ICD-10-CM | POA: Diagnosis not present

## 2022-12-23 DIAGNOSIS — R7989 Other specified abnormal findings of blood chemistry: Secondary | ICD-10-CM | POA: Diagnosis not present

## 2022-12-23 DIAGNOSIS — M797 Fibromyalgia: Secondary | ICD-10-CM

## 2022-12-23 DIAGNOSIS — R600 Localized edema: Secondary | ICD-10-CM

## 2022-12-23 DIAGNOSIS — G2581 Restless legs syndrome: Secondary | ICD-10-CM | POA: Diagnosis not present

## 2022-12-23 LAB — BASIC METABOLIC PANEL
BUN: 11 (ref 4–21)
BUN: 11 (ref 4–21)
CO2: 31 — AB (ref 13–22)
CO2: 31 — AB (ref 13–22)
Chloride: 106 (ref 99–108)
Chloride: 106 (ref 99–108)
Creatinine: 1 (ref 0.5–1.1)
Creatinine: 1 (ref 0.5–1.1)
Glucose: 93
Potassium: 4.1 mEq/L (ref 3.5–5.1)
Potassium: 4.1 mEq/L (ref 3.5–5.1)
Sodium: 143 (ref 137–147)
Sodium: 143 (ref 137–147)

## 2022-12-23 LAB — CBC: RBC: 4.34 (ref 3.87–5.11)

## 2022-12-23 LAB — COMPREHENSIVE METABOLIC PANEL
Albumin: 4 (ref 3.5–5.0)
Calcium: 9.9 (ref 8.7–10.7)
eGFR: 57
eGFR: 57

## 2022-12-23 LAB — CBC AND DIFFERENTIAL
HCT: 39 (ref 36–46)
Hemoglobin: 13 (ref 12.0–16.0)
Neutrophils Absolute: 3.9
Platelets: 202 10*3/uL (ref 150–400)
WBC: 6.8

## 2022-12-23 LAB — HEPATIC FUNCTION PANEL
ALT: 14 U/L (ref 7–35)
AST: 25 (ref 13–35)
Alkaline Phosphatase: 89 (ref 25–125)
Bilirubin, Total: 0.4

## 2022-12-23 LAB — LIPID PANEL
Cholesterol: 201 — AB (ref 0–200)
HDL: 98 — AB (ref 35–70)
LDL Cholesterol: 82
LDl/HDL Ratio: 2.1
Triglycerides: 104 (ref 40–160)

## 2022-12-23 LAB — TSH: TSH: 0.59 (ref 0.41–5.90)

## 2022-12-23 NOTE — Assessment & Plan Note (Signed)
Chronic stable diffuse pain. Take Hydrocodone tid and skelaxin as needed.

## 2022-12-23 NOTE — Assessment & Plan Note (Signed)
Good response to use of Requip nightly.

## 2022-12-23 NOTE — Assessment & Plan Note (Signed)
Symptoms managed with Buspar

## 2022-12-23 NOTE — Progress Notes (Signed)
Date:  12/23/2022   Name:  Sandra Mckinney   DOB:  [birth year]   MRN:  [id number]   Chief Complaint: Annual Exam Sandra Mckinney is a 77 y.o. female who presents today for her Complete Annual Exam. She feels well. She reports exercising none. She reports she is sleeping better on Trazodone. Breast complaints none.  Mammogram: discontinued due to bilateral mastectomies DEXA: 07/2018 Normal spine; osteopenia hip Colonoscopy: 03/2020 Positive - no colonoscopy done due to issues with prep  Health Maintenance Due  Topic Date Due   DTaP/Tdap/Td (2 - Td or Tdap) 11/23/2021   COVID-19 Vaccine (7 - 2023-24 season) 06/13/2022    Immunization History  Administered Date(s) Administered   COVID-19, mRNA, vaccine(Comirnaty)12 years and older 04/18/2022   Fluad Quad(high Dose 65+) 03/22/2022   Influenza, High Dose Seasonal PF 03/05/2017, 03/11/2018, 04/11/2019, 03/18/2020, 03/26/2021   Influenza-Unspecified 03/07/2014, 02/03/2017, 04/11/2019   PFIZER(Purple Top)SARS-COV-2 Vaccination 08/19/2019, 09/09/2019, 04/05/2020, 10/22/2020   PNEUMOCOCCAL CONJUGATE-20 12/23/2022   Pfizer Covid-19 Vaccine Bivalent Booster 72yrs & up 03/15/2021   Pneumococcal Conjugate-13 03/14/2014, 09/17/2015   Pneumococcal Polysaccharide-23 04/18/2002, 11/07/2010, 04/14/2018   Tdap 11/24/2011   Zoster Recombinat (Shingrix) 09/16/2021, 11/19/2021   Zoster, Live 07/07/2008, 10/09/2014    HPI RLS - well controlled on Requip. MCI - actually improved with better sleep on Trazodone.  Still taking Namenda and Aricept as well. Fibromyalgia - symptoms stable but not doing any exercise and has gained some weight.  Continue Hydrocodone and gabapentin.  Limit calories to avoid further gain. Bipolar disorder - doing well on current regimen; followed by [facility]. Migraine - controlled with daily beta blocker.  Lab Results  Component Value Date   NA 141 09/18/2021   K 4.0 09/18/2021   CO2 30 (A)  09/18/2021   GLUCOSE 88 12/06/2014   BUN 17 09/18/2021   CREATININE 1.1 09/18/2021   CALCIUM 10.2 09/18/2021   GFRNONAA 56 04/19/2020   Lab Results  Component Value Date   CHOL 192 09/18/2021   CHOL 192 09/18/2021   HDL 84 (A) 09/18/2021   HDL 84 (A) 09/18/2021   LDLCALC 56 09/18/2021   LDLCALC 56 09/18/2021   TRIG 260 (A) 09/18/2021   TRIG 261 (A) 09/18/2021   CHOLHDL 2.3 04/16/2016   Lab Results  Component Value Date   TSH 51.30 (A) 09/18/2021   TSH 0.62 09/18/2021   Lab Results  Component Value Date   HGBA1C 5.6 09/18/2021   Lab Results  Component Value Date   WBC 8.7 04/19/2020   HGB 12.4 09/18/2021   HCT 38 09/18/2021   MCV 88 07/29/2012   PLT 371 09/18/2021   Lab Results  Component Value Date   ALT 23 09/18/2021   AST 27 09/18/2021   ALKPHOS 191 (A) 09/18/2021   Lab Results  Component Value Date   VD25OH 51 09/18/2021     Review of Systems  Constitutional:  Negative for chills, fatigue and fever.  HENT:  Negative for congestion, hearing loss, tinnitus, trouble swallowing and voice change.   Eyes:  Negative for visual disturbance.  Respiratory:  Negative for cough, chest tightness, shortness of breath and wheezing.   Cardiovascular:  Negative for chest pain, palpitations and leg swelling.  Gastrointestinal:  Negative for abdominal pain, constipation, diarrhea and vomiting.  Endocrine: Negative for polydipsia and polyuria.  Genitourinary:  Negative for dysuria, frequency, genital sores, vaginal bleeding and vaginal discharge.  Musculoskeletal:  Positive for arthralgias and myalgias. Negative for gait problem and joint swelling.  Skin:  Negative for color change and rash.  Neurological:  Negative for dizziness, tremors, light-headedness and headaches.  Hematological:  Negative for adenopathy. Does not bruise/bleed easily.  Psychiatric/Behavioral:  Positive for sleep disturbance. Negative for dysphoric mood. The patient is not nervous/anxious.      Patient Active Problem List   Diagnosis Date Noted   Primary osteoarthritis of both hands 07/26/2021   Chronic rhinitis 07/26/2021   Mild cognitive impairment 02/08/2020   Narcotic use agreement exists 12/14/2019   Acquired absence of both breasts 06/22/2019   Fibromyalgia 07/29/2016   Mixed hyperlipidemia 05/20/2016   Age-related osteoporosis without current pathological fracture 04/16/2016   Anemia 12/12/2015   Neoplasm of left breast, primary tumor staging category Tis: ductal carcinoma in situ (DCIS) 01/03/2015   History of left breast cancer 01/03/2015   Neoplasm of right breast, primary tumor staging category Tis: ductal carcinoma in situ (DCIS) 12/27/2014   Anxiety disorder 10/11/2014   Plantar fasciitis 10/11/2014   Local edema 10/11/2014   Bipolar 2 disorder, major depressive episode (HCC) 10/11/2014   Hot flash, menopausal 10/11/2014   Migraine without aura and responsive to treatment 10/11/2014   Idiopathic insomnia 10/11/2014   Psoriasis 10/11/2014   Restless leg 10/11/2014   Avitaminosis D 10/11/2014    Allergies  Allergen Reactions   Cephalexin Hives   Letrozole Swelling   Raloxifene Hives   Selective Estrogen Receptor Modulators Hives   Citalopram Other (See Comments)    Dry mouth    Cephalosporins    Duloxetine Hcl    Exemestane Other (See Comments)   Lyrica [Pregabalin]    Oxycodone    Penicillins    Prilosec  [Omeprazole]    Sulfa Antibiotics     Past Surgical History:  Procedure Laterality Date   ABDOMINAL HYSTERECTOMY  1986   uterine fibroids and endometriosis   BREAST BIOPSY Right 06/01/12   [place] bx/clip-neg   BREAST IMPLANT REMOVAL Bilateral 10/19/2019   BREAST RECONSTRUCTION Bilateral 2017   BREAST REDUCTION SURGERY  10/19/2019   all breast reconstructions removed   CARPAL TUNNEL RELEASE Right 2014   EYE SURGERY Bilateral 2009   catarcts   MASTECTOMY, RADICAL Bilateral 2016   REDUCTION MAMMAPLASTY     2001   THUMB ARTHROSCOPY  Right 2014    Social History   Tobacco Use   Smoking status: Former    Packs/day: 0.75    Years: 10.00    Additional pack years: 0.00    Total pack years: 7.50    Types: Cigarettes    Quit date: 04/13/1974    Years since quitting: 48.7   Smokeless tobacco: Never   Tobacco comments:    smoking cessation materials not required  Vaping Use   Vaping Use: Never used  Substance Use Topics   Alcohol use: No    Alcohol/week: 0.0 standard drinks of alcohol   Drug use: No     Medication list has been reviewed and updated.  Current Meds  Medication Sig   aspirin-acetaminophen-caffeine (EXCEDRIN MIGRAINE) 250-250-65 MG tablet Take by mouth every 6 (six) hours as needed for headache.   busPIRone (BUSPAR) 10 MG tablet TAKE 1 TABLET FOUR TIMES DAILY   Calcium-Magnesium-Vitamin D 600-40-500 MG-MG-UNIT TB24 Take 1 tablet by mouth daily.   chlorpheniramine (CHLOR-TRIMETON) 4 MG tablet Take 4 mg by mouth daily.   cholecalciferol (VITAMIN D) 1000 UNITS tablet Take 1 tablet by mouth daily.   diclofenac Sodium (VOLTAREN) 1 % GEL Apply topically 4 (four) times daily.   donepezil (  ARICEPT) 10 MG tablet Take by mouth.   ferrous sulfate 325 (65 FE) MG tablet Take 325 mg by mouth daily with breakfast.   furosemide (LASIX) 20 MG tablet TAKE 1 TABLET EVERY DAY (NEED MD APPOINTMENT)   gabapentin (NEURONTIN) 100 MG capsule Take 100 mg by mouth. 2,4,6   glucosamine-chondroitin 500-400 MG tablet Take 1 tablet by mouth 2 (two) times daily.   HYDROcodone-acetaminophen (NORCO/VICODIN) 5-325 MG tablet Take 1 tablet by mouth every 6 (six) hours as needed for moderate pain. Fibromyalgia.   lamoTRIgine (LAMICTAL) 25 MG tablet Take 50 mg by mouth in the morning and at bedtime.   memantine (NAMENDA) 5 MG tablet TAKE 1 TABLET TWICE DAILY   metaxalone (SKELAXIN) 800 MG tablet TAKE 1 TABLET THREE TIMES DAILY AS NEEDED FOR MUSCLE SPASM(S)   MULTIPLE VITAMINS-MINERALS PO Take 1 tablet by mouth daily.   propranolol ER  (INDERAL LA) 60 MG 24 hr capsule TAKE 1 CAPSULE EVERY DAY   rOPINIRole (REQUIP) 0.5 MG tablet TAKE 7 TABLETS AT BEDTIME   traZODone (DESYREL) 50 MG tablet Take 50 mg by mouth at bedtime. 1.5 tablets =75mg        12/23/2022   10:14 AM 06/19/2022    9:15 AM 12/18/2021   10:13 AM 07/26/2021    1:31 PM  GAD 7 : Generalized Anxiety Score  Nervous, Anxious, on Edge 1 0 0 0  Control/stop worrying 0 0 0 0  Worry too much - different things 0 0 0 0  Trouble relaxing 0 0 0 0  Restless 0 0 0 0  Easily annoyed or irritable 1 0 1 2  Afraid - awful might happen 0 0 0 0  Total GAD 7 Score 2 0 1 2  Anxiety Difficulty Not difficult at all Not difficult at all Not difficult at all        12/23/2022   10:14 AM 07/18/2022    9:33 AM 06/19/2022    9:14 AM  Depression screen PHQ 2/9  Decreased Interest 0 0 2  Down, Depressed, Hopeless 0 0 2  PHQ - 2 Score 0 0 4  Altered sleeping 0 0 1  Tired, decreased energy 0 0 1  Change in appetite 0 2 0  Feeling bad or failure about yourself  0 0 0  Trouble concentrating 0 0 0  Moving slowly or fidgety/restless 0 0 0  Suicidal thoughts  0 0  PHQ-9 Score 0 2 6  Difficult doing work/chores Not difficult at all Not difficult at all Somewhat difficult    BP Readings from Last 3 Encounters:  12/23/22 126/82  06/19/22 110/78  12/18/21 124/80    Physical Exam Vitals and nursing note reviewed.  Constitutional:      General: She is not in acute distress.    Appearance: She is well-developed.  HENT:     Head: Normocephalic and atraumatic.     Right Ear: Tympanic membrane and ear canal normal.     Left Ear: Tympanic membrane and ear canal normal.     Nose:     Right Sinus: No maxillary sinus tenderness.     Left Sinus: No maxillary sinus tenderness.  Eyes:     General: No scleral icterus.       Right eye: No discharge.        Left eye: No discharge.     Conjunctiva/sclera: Conjunctivae normal.  Neck:     Thyroid: No thyromegaly.     Vascular: No  carotid bruit.  Cardiovascular:  Rate and Rhythm: Normal rate and regular rhythm.     Pulses: Normal pulses.     Heart sounds: Normal heart sounds.  Pulmonary:     Effort: Pulmonary effort is normal. No respiratory distress.     Breath sounds: No wheezing.  Chest:  Breasts:    Right: Absent.     Left: Absent.     Comments: Breast exam deferred - healed scars Abdominal:     General: Bowel sounds are normal.     Palpations: Abdomen is soft.     Tenderness: There is no abdominal tenderness.  Musculoskeletal:     Cervical back: Normal range of motion. No erythema.     Right lower leg: No edema.     Left lower leg: No edema.  Lymphadenopathy:     Cervical: No cervical adenopathy.  Skin:    General: Skin is warm and dry.     Findings: No rash.  Neurological:     Mental Status: She is alert and oriented to person, place, and time.     Cranial Nerves: No cranial nerve deficit.     Sensory: No sensory deficit.     Deep Tendon Reflexes: Reflexes are normal and symmetric.  Psychiatric:        Attention and Perception: Attention normal.        Mood and Affect: Mood normal.        Behavior: Behavior normal.     Wt Readings from Last 3 Encounters:  12/23/22 177 lb (80.3 kg)  06/19/22 176 lb (79.8 kg)  12/18/21 169 lb 12.8 oz (77 kg)    BP 126/82   Pulse 61   Ht 5\' 2"  (1.575 m)   Wt 177 lb (80.3 kg)   SpO2 97%   BMI 32.37 kg/m   Assessment and Plan:  Problem List Items Addressed This Visit     Restless leg (Chronic)    Good response to use of Requip nightly.      Relevant Orders   CBC with Differential/Platelet   Mixed hyperlipidemia    Managed with diet alone.       Relevant Orders   Lipid panel   Local edema (Chronic)    Treated with PRN lasix      Relevant Orders   Comprehensive metabolic panel   Fibromyalgia (Chronic)    Chronic stable diffuse pain. Take Hydrocodone tid and skelaxin as needed.      Relevant Medications   gabapentin (NEURONTIN)  100 MG capsule   Bipolar 2 disorder, major depressive episode (HCC)    Being followed at [facility] Doing fairly well on lamictal and trazodone for sleep      Anxiety disorder    Symptoms managed with Buspar      Other Visit Diagnoses     Annual physical exam    -  Primary   declines further CRC screening will update Prevnar-20   Elevated TSH       Relevant Orders   TSH + free T4   Need for hepatitis C screening test       Relevant Orders   Hepatitis C antibody   Need for vaccination for pneumococcus       Relevant Orders   Pneumococcal conjugate vaccine 20-valent (Completed)       No follow-ups on file.   Partially dictated using Dragon software, any errors are not intentional.  Reubin Milan, MD [facility] Primary Care and Sports Medicine [place], [place]

## 2022-12-23 NOTE — Assessment & Plan Note (Signed)
Being followed at [facility] Doing fairly well on lamictal and trazodone for sleep

## 2022-12-23 NOTE — Assessment & Plan Note (Signed)
Managed with diet alone 

## 2022-12-23 NOTE — Assessment & Plan Note (Signed)
Treated with PRN lasix

## 2022-12-26 ENCOUNTER — Encounter: Payer: Self-pay | Admitting: Internal Medicine

## 2022-12-29 ENCOUNTER — Other Ambulatory Visit: Payer: Self-pay | Admitting: Internal Medicine

## 2022-12-29 DIAGNOSIS — G3184 Mild cognitive impairment, so stated: Secondary | ICD-10-CM

## 2022-12-29 DIAGNOSIS — G2581 Restless legs syndrome: Secondary | ICD-10-CM

## 2023-01-02 ENCOUNTER — Encounter: Payer: Self-pay | Admitting: Internal Medicine

## 2023-01-04 ENCOUNTER — Other Ambulatory Visit: Payer: Self-pay | Admitting: Internal Medicine

## 2023-01-04 DIAGNOSIS — M797 Fibromyalgia: Secondary | ICD-10-CM

## 2023-01-04 MED ORDER — HYDROCODONE-ACETAMINOPHEN 5-325 MG PO TABS
1.0000 | ORAL_TABLET | Freq: Four times a day (QID) | ORAL | 0 refills | Status: DC | PRN
Start: 2023-01-04 — End: 2023-02-02

## 2023-01-29 ENCOUNTER — Other Ambulatory Visit: Payer: Self-pay | Admitting: Internal Medicine

## 2023-01-30 NOTE — Telephone Encounter (Signed)
Requested Prescriptions  Pending Prescriptions Disp Refills   busPIRone (BUSPAR) 10 MG tablet [Pharmacy Med Name: BUSPIRONE HYDROCHLORIDE 10 MG Tablet] 360 tablet 3    Sig: TAKE 1 TABLET FOUR TIMES DAILY     Psychiatry: Anxiolytics/Hypnotics - Non-controlled Passed - 01/29/2023  4:08 PM      Passed - Valid encounter within last 12 months    Recent Outpatient Visits           1 month ago Annual physical exam   [place] [facility] at [facility], Nyoka Cowden, MD   7 months ago Fibromyalgia   [facility] Primary Care & Sports Medicine at [facility], Nyoka Cowden, MD   1 year ago Migraine without aura and responsive to treatment   [facility] at [facility], Nyoka Cowden, MD   1 year ago Acute pain of both knees   [facility] Medicine at [facility], Nyoka Cowden, MD   1 year ago Fibromyalgia   [facility] Primary Care & Sports Medicine at [facility], Nyoka Cowden, MD       Future Appointments             In 10 months Judithann Graves Nyoka Cowden, MD [facility] Health Primary Care & Sports Medicine at [facility], [facility]

## 2023-02-01 ENCOUNTER — Encounter: Payer: Self-pay | Admitting: Internal Medicine

## 2023-02-02 ENCOUNTER — Encounter: Payer: Self-pay | Admitting: Internal Medicine

## 2023-02-02 ENCOUNTER — Other Ambulatory Visit: Payer: Self-pay | Admitting: Internal Medicine

## 2023-02-02 DIAGNOSIS — M797 Fibromyalgia: Secondary | ICD-10-CM

## 2023-02-02 MED ORDER — HYDROCODONE-ACETAMINOPHEN 5-325 MG PO TABS
1.0000 | ORAL_TABLET | Freq: Four times a day (QID) | ORAL | 0 refills | Status: DC | PRN
Start: 2023-02-02 — End: 2023-02-02

## 2023-02-02 MED ORDER — HYDROCODONE-ACETAMINOPHEN 5-325 MG PO TABS
1.0000 | ORAL_TABLET | Freq: Four times a day (QID) | ORAL | 0 refills | Status: DC | PRN
Start: 2023-02-02 — End: 2023-03-04

## 2023-03-04 ENCOUNTER — Other Ambulatory Visit: Payer: Self-pay | Admitting: Internal Medicine

## 2023-03-04 ENCOUNTER — Encounter: Payer: Self-pay | Admitting: Internal Medicine

## 2023-03-04 DIAGNOSIS — M797 Fibromyalgia: Secondary | ICD-10-CM

## 2023-03-04 MED ORDER — HYDROCODONE-ACETAMINOPHEN 5-325 MG PO TABS
1.0000 | ORAL_TABLET | Freq: Four times a day (QID) | ORAL | 0 refills | Status: AC | PRN
Start: 2023-03-04 — End: 2023-04-03

## 2023-03-05 DIAGNOSIS — Z872 Personal history of diseases of the skin and subcutaneous tissue: Secondary | ICD-10-CM | POA: Diagnosis not present

## 2023-03-05 DIAGNOSIS — L578 Other skin changes due to chronic exposure to nonionizing radiation: Secondary | ICD-10-CM | POA: Diagnosis not present

## 2023-03-05 DIAGNOSIS — D225 Melanocytic nevi of trunk: Secondary | ICD-10-CM | POA: Diagnosis not present

## 2023-03-05 DIAGNOSIS — D485 Neoplasm of uncertain behavior of skin: Secondary | ICD-10-CM | POA: Diagnosis not present

## 2023-03-05 DIAGNOSIS — Z86018 Personal history of other benign neoplasm: Secondary | ICD-10-CM | POA: Diagnosis not present

## 2023-03-23 ENCOUNTER — Encounter: Payer: Self-pay | Admitting: Internal Medicine

## 2023-03-24 ENCOUNTER — Ambulatory Visit (INDEPENDENT_AMBULATORY_CARE_PROVIDER_SITE_OTHER): Payer: Medicare PPO

## 2023-03-24 DIAGNOSIS — Z23 Encounter for immunization: Secondary | ICD-10-CM

## 2023-03-24 DIAGNOSIS — D225 Melanocytic nevi of trunk: Secondary | ICD-10-CM | POA: Diagnosis not present

## 2023-03-24 DIAGNOSIS — D235 Other benign neoplasm of skin of trunk: Secondary | ICD-10-CM | POA: Diagnosis not present

## 2023-04-06 ENCOUNTER — Encounter: Payer: Self-pay | Admitting: Internal Medicine

## 2023-04-06 ENCOUNTER — Other Ambulatory Visit: Payer: Self-pay | Admitting: Internal Medicine

## 2023-04-06 DIAGNOSIS — M797 Fibromyalgia: Secondary | ICD-10-CM

## 2023-04-06 MED ORDER — HYDROCODONE-ACETAMINOPHEN 5-325 MG PO TABS
1.0000 | ORAL_TABLET | Freq: Four times a day (QID) | ORAL | 0 refills | Status: DC | PRN
Start: 2023-04-06 — End: 2023-05-05

## 2023-05-05 ENCOUNTER — Encounter: Payer: Self-pay | Admitting: Internal Medicine

## 2023-05-05 ENCOUNTER — Other Ambulatory Visit: Payer: Self-pay | Admitting: Internal Medicine

## 2023-05-05 DIAGNOSIS — M797 Fibromyalgia: Secondary | ICD-10-CM

## 2023-05-05 MED ORDER — HYDROCODONE-ACETAMINOPHEN 5-325 MG PO TABS
1.0000 | ORAL_TABLET | Freq: Four times a day (QID) | ORAL | 0 refills | Status: DC | PRN
Start: 2023-05-05 — End: 2023-06-25

## 2023-05-29 DIAGNOSIS — F5101 Primary insomnia: Secondary | ICD-10-CM | POA: Diagnosis not present

## 2023-05-29 DIAGNOSIS — F3181 Bipolar II disorder: Secondary | ICD-10-CM | POA: Diagnosis not present

## 2023-05-29 DIAGNOSIS — G3184 Mild cognitive impairment, so stated: Secondary | ICD-10-CM | POA: Diagnosis not present

## 2023-06-25 ENCOUNTER — Other Ambulatory Visit: Payer: Self-pay | Admitting: Internal Medicine

## 2023-06-25 ENCOUNTER — Encounter: Payer: Self-pay | Admitting: Internal Medicine

## 2023-06-25 DIAGNOSIS — M797 Fibromyalgia: Secondary | ICD-10-CM

## 2023-06-25 MED ORDER — HYDROCODONE-ACETAMINOPHEN 5-325 MG PO TABS
1.0000 | ORAL_TABLET | Freq: Three times a day (TID) | ORAL | 0 refills | Status: DC | PRN
Start: 2023-06-25 — End: 2023-07-22

## 2023-06-25 NOTE — Telephone Encounter (Signed)
Please review.  KP

## 2023-07-22 ENCOUNTER — Other Ambulatory Visit: Payer: Self-pay | Admitting: Internal Medicine

## 2023-07-22 DIAGNOSIS — M797 Fibromyalgia: Secondary | ICD-10-CM

## 2023-07-22 MED ORDER — HYDROCODONE-ACETAMINOPHEN 5-325 MG PO TABS: 1.0000 | ORAL_TABLET | Freq: Three times a day (TID) | ORAL | 0 refills | Status: DC | PRN

## 2023-07-24 ENCOUNTER — Encounter: Payer: Self-pay | Admitting: Internal Medicine

## 2023-07-24 NOTE — Telephone Encounter (Signed)
Please review.  KP

## 2023-07-30 ENCOUNTER — Ambulatory Visit: Payer: Medicare PPO | Admitting: Emergency Medicine

## 2023-07-30 VITALS — Ht 62.0 in | Wt 168.0 lb

## 2023-07-30 DIAGNOSIS — Z780 Asymptomatic menopausal state: Secondary | ICD-10-CM | POA: Diagnosis not present

## 2023-07-30 DIAGNOSIS — Z0000 Encounter for general adult medical examination without abnormal findings: Secondary | ICD-10-CM | POA: Diagnosis not present

## 2023-07-30 NOTE — Patient Instructions (Addendum)
Sandra Mckinney , Thank you for taking time to come for your Medicare Wellness Visit. I appreciate your ongoing commitment to your health goals. Please review the following plan we discussed and let me know if I can assist you in the future.   Referrals/Orders/Follow-Ups/Clinician Recommendations: Get a tetanus vaccine at your earliest convenience. I have placed an order for a bone density test (DEXA scan). Call [facility] @ [phone] to schedule.  This is a list of the screening recommended for you and due dates:  Health Maintenance  Topic Date Due   DTaP/Tdap/Td vaccine (2 - Td or Tdap) 11/23/2021   Cologuard (Stool DNA test)  04/03/2023   COVID-19 Vaccine (8 - 2024-25 season) 05/19/2023   Medicare Annual Wellness Visit  07/29/2024   Pneumonia Vaccine  Completed   Flu Shot  Completed   DEXA scan (bone density measurement)  Completed   Zoster (Shingles) Vaccine  Completed   Hepatitis C Screening  Addressed   HPV Vaccine  Aged Out   Colon Cancer Screening  Discontinued    Advanced directives: (Copy Requested) Please bring a copy of your health care power of attorney and living will to the office to be added to your chart at your convenience.  Next Medicare Annual Wellness Visit scheduled for next year: Yes, 08/12/23 @ 8:40 (video visit), Ken's will be at 9:20am  Managing Pain Without Opioids Opioids are strong medicines used to treat moderate to severe pain. For some people, especially those who have long-term (chronic) pain, opioids may not be the best choice for pain management due to: Side effects like nausea, constipation, and sleepiness. The risk of addiction (opioid use disorder). The longer you take opioids, the greater your risk of addiction. Pain that lasts for more than 3 months is called chronic pain. Managing chronic pain usually requires more than one approach and is often provided by a team of health care providers working together (multidisciplinary  approach). Pain management may be done at a pain management center or pain clinic. How to manage pain without the use of opioids Use non-opioid medicines Non-opioid medicines for pain may include: Over-the-counter or prescription non-steroidal anti-inflammatory drugs (NSAIDs). These may be the first medicines used for pain. They work well for muscle and bone pain, and they reduce swelling. Acetaminophen. This over-the-counter medicine may work well for milder pain but not swelling. Antidepressants. These may be used to treat chronic pain. A certain type of antidepressant (tricyclics) is often used. These medicines are given in lower doses for pain than when used for depression. Anticonvulsants. These are usually used to treat seizures but may also reduce nerve (neuropathic) pain. Muscle relaxants. These relieve pain caused by sudden muscle tightening (spasms). You may also use a pain medicine that is applied to the skin as a patch, cream, or gel (topical analgesic), such as a numbing medicine. These may cause fewer side effects than medicines taken by mouth. Do certain therapies as directed Some therapies can help with pain management. They include: Physical therapy. You will do exercises to gain strength and flexibility. A physical therapist may teach you exercises to move and stretch parts of your body that are weak, stiff, or painful. You can learn these exercises at physical therapy visits and practice them at home. Physical therapy may also involve: Massage. Heat wraps or applying heat or cold to affected areas. Electrical signals that interrupt pain signals (transcutaneous electrical nerve stimulation, TENS). Weak lasers that reduce pain and swelling (low-level laser therapy). Signals from  your body that help you learn to regulate pain (biofeedback). Occupational therapy. This helps you to learn ways to function at home and work with less pain. Recreational therapy. This involves trying new  activities or hobbies, such as a physical activity or drawing. Mental health therapy, including: Cognitive behavioral therapy (CBT). This helps you learn coping skills for dealing with pain. Acceptance and commitment therapy (ACT) to change the way you think and react to pain. Relaxation therapies, including muscle relaxation exercises and mindfulness-based stress reduction. Pain management counseling. This may be individual, family, or group counseling.  Receive medical treatments Medical treatments for pain management include: Nerve block injections. These may include a pain blocker and anti-inflammatory medicines. You may have injections: Near the spine to relieve chronic back or neck pain. Into joints to relieve back or joint pain. Into nerve areas that supply a painful area to relieve body pain. Into muscles (trigger point injections) to relieve some painful muscle conditions. A medical device placed near your spine to help block pain signals and relieve nerve pain or chronic back pain (spinal cord stimulation device). Acupuncture. Follow these instructions at home Medicines Take over-the-counter and prescription medicines only as told by your health care provider. If you are taking pain medicine, ask your health care providers about possible side effects to watch out for. Do not drive or use heavy machinery while taking prescription opioid pain medicine. Lifestyle  Do not use drugs or alcohol to reduce pain. If you drink alcohol, limit how much you have to: 0-1 drink a day for women who are not pregnant. 0-2 drinks a day for men. Know how much alcohol is in a drink. In the [place] one drink equals one 12 oz bottle of beer (355 mL), one 5 oz glass of wine (148 mL), or one 1 oz glass of hard liquor (44 mL). Do not use any products that contain nicotine or tobacco. These products include cigarettes, chewing tobacco, and vaping devices, such as e-cigarettes. If you need help quitting, ask  your health care provider. Eat a healthy diet and maintain a healthy weight. Poor diet and excess weight may make pain worse. Eat foods that are high in fiber. These include fresh fruits and vegetables, whole grains, and beans. Limit foods that are high in fat and processed sugars, such as fried and sweet foods. Exercise regularly. Exercise lowers stress and may help relieve pain. Ask your health care provider what activities and exercises are safe for you. If your health care provider approves, join an exercise class that combines movement and stress reduction. Examples include yoga and tai chi. Get enough sleep. Lack of sleep may make pain worse. Lower stress as much as possible. Practice stress reduction techniques as told by your therapist. General instructions Work with all your pain management providers to find the treatments that work best for you. You are an important member of your pain management team. There are many things you can do to reduce pain on your own. Consider joining an online or in-person support group for people who have chronic pain. Keep all follow-up visits. This is important. Where to find more information You can find more information about managing pain without opioids from: American Academy of Pain Medicine: painmed.org Institute for Chronic Pain: instituteforchronicpain.org American Chronic Pain Association: theacpa.org Contact a health care provider if: You have side effects from pain medicine. Your pain gets worse or does not get better with treatments or home therapy. You are struggling with anxiety or depression.  Summary Many types of pain can be managed without opioids. Chronic pain may respond better to pain management without opioids. Pain is best managed when you and a team of health care providers work together. Pain management without opioids may include non-opioid medicines, medical treatments, physical therapy, mental health therapy, and lifestyle  changes. Tell your health care providers if your pain gets worse or is not being managed well enough. This information is not intended to replace advice given to you by your health care provider. Make sure you discuss any questions you have with your health care provider. Document Revised: 10/03/2020 Document Reviewed: 10/03/2020 Elsevier Patient Education  2024 [employer].

## 2023-07-30 NOTE — Progress Notes (Cosign Needed)
Subjective:   Sandra Mckinney is a 78 y.o. female who presents for Medicare Annual (Subsequent) preventive examination.  Visit Complete: Virtual I connected with  Emelda Fear Pembroke on 07/30/23 by a video and audio enabled telemedicine application and verified that I am speaking with the correct person using two identifiers.  Patient Location: Home  Provider Location: Home Office  I discussed the limitations of evaluation and management by telemedicine. The patient expressed understanding and agreed to proceed.  Vital Signs: Because this visit was a virtual/telehealth visit, some criteria may be missing or patient reported. Any vitals not documented were not able to be obtained and vitals that have been documented are patient reported.  Patient Medicare AWV questionnaire was completed by the patient on 07/30/23; I have confirmed that all information answered by patient is correct and no changes since this date.  Cardiac Risk Factors include: advanced age (>34men, >15 women);dyslipidemia;obesity (BMI >30kg/m2)     Objective:    Today's Vitals   07/30/23 0909 07/30/23 0911  Weight: 168 lb (76.2 kg)   Height: 5\' 2"  (1.575 m)   PainSc:  6    Body mass index is 30.73 kg/m.     07/30/2023    9:29 AM 07/18/2022   10:36 AM 06/26/2021    8:51 AM 06/25/2020    9:04 AM 04/18/2019    9:47 AM 04/14/2018    3:13 PM 04/13/2017    8:26 AM  Advanced Directives  Does Patient Have a Medical Advance Directive? Yes Yes Yes Yes Yes Yes Yes  Type of Estate agent of [place];Living will Living will;Healthcare Power of [employer] Power of [place];Living will Healthcare Power of [place];Living will Living will;Healthcare Power of [employer] Power of [place];Living will Healthcare Power of [place];Living will  Does patient want to make changes to medical advance directive? No - Patient declined No - Patient declined       Copy of  Healthcare Power of Attorney in Chart? No - copy requested No - copy requested No - copy requested No - copy requested No - copy requested No - copy requested No - copy requested    Current Medications (verified) Outpatient Encounter Medications as of 07/30/2023  Medication Sig   aspirin-acetaminophen-caffeine (EXCEDRIN MIGRAINE) 250-250-65 MG tablet Take by mouth every 6 (six) hours as needed for headache.   busPIRone (BUSPAR) 10 MG tablet TAKE 1 TABLET FOUR TIMES DAILY   Calcium-Magnesium-Vitamin D 600-40-500 MG-MG-UNIT TB24 Take 1 tablet by mouth daily.   chlorpheniramine (CHLOR-TRIMETON) 4 MG tablet Take 4 mg by mouth daily.   cholecalciferol (VITAMIN D) 1000 UNITS tablet Take 1 tablet by mouth daily.   donepezil (ARICEPT) 10 MG tablet Take by mouth.   ferrous sulfate 325 (65 FE) MG tablet Take 325 mg by mouth daily with breakfast.   furosemide (LASIX) 20 MG tablet TAKE 1 TABLET EVERY DAY (NEED MD APPOINTMENT)   glucosamine-chondroitin 500-400 MG tablet Take 1 tablet by mouth 2 (two) times daily.   HYDROcodone-acetaminophen (NORCO/VICODIN) 5-325 MG tablet Take 1 tablet by mouth every 8 (eight) hours as needed for moderate pain (pain score 4-6). Fibromyalgia.   lamoTRIgine (LAMICTAL) 25 MG tablet Take 50 mg by mouth in the morning and at bedtime.   memantine (NAMENDA) 5 MG tablet TAKE 1 TABLET TWICE DAILY   metaxalone (SKELAXIN) 800 MG tablet TAKE 1 TABLET THREE TIMES DAILY AS NEEDED FOR MUSCLE SPASM(S)   MULTIPLE VITAMINS-MINERALS PO Take 1 tablet by mouth daily.   propranolol ER (  INDERAL LA) 60 MG 24 hr capsule TAKE 1 CAPSULE EVERY DAY   rOPINIRole (REQUIP) 0.5 MG tablet TAKE 7 TABLETS AT BEDTIME   traZODone (DESYREL) 50 MG tablet Take 50 mg by mouth at bedtime. 1.5 tablets =75mg    diclofenac Sodium (VOLTAREN) 1 % GEL Apply topically 4 (four) times daily. (Patient not taking: Reported on 07/30/2023)   No facility-administered encounter medications on file as of 07/30/2023.    Allergies  (verified) Cephalexin, Letrozole, Raloxifene, Selective estrogen receptor modulators, Citalopram, Cephalosporins, Duloxetine hcl, Exemestane, Lyrica [pregabalin], Oxycodone, Penicillins, Prilosec  [omeprazole], and Sulfa antibiotics   History: Past Medical History:  Diagnosis Date   Breast cancer (HCC) 2016   Complication of anesthesia    2007.  Med injected into vein instead of muscle.  Heart stopped.  3 "shocks" to restart.  3 grand mal seizures after heart restarted.   Fibromyalgia    Hyperlipidemia    Major depressive disorder    Migraine headache    only 3-4 per year since starting propranolol   Restless leg syndrome    Seizures (HCC) 2007   Med was injected into vein instead of muscle.  Heart stopped.  3 shocks to restart heart.  3 grand mal seizures after heart restarted.   Vitamin D deficiency    Past Surgical History:  Procedure Laterality Date   ABDOMINAL HYSTERECTOMY  1986   uterine fibroids and endometriosis   BREAST BIOPSY Right 06/01/12   [place] bx/clip-neg   BREAST IMPLANT REMOVAL Bilateral 10/19/2019   BREAST RECONSTRUCTION Bilateral 2017   BREAST REDUCTION SURGERY  10/19/2019   all breast reconstructions removed   CARPAL TUNNEL RELEASE Right 2014   EYE SURGERY Bilateral 2009   catarcts   MASTECTOMY, RADICAL Bilateral 2016   REDUCTION MAMMAPLASTY     2001   THUMB ARTHROSCOPY Right 2014   Family History  Problem Relation Age of Onset   Dementia Mother    Hypertension Mother    Heart disease Father 37   Social History   Socioeconomic History   Marital status: Married    Spouse name: Charlee Dresch   Number of children: 1   Years of education: some college   Highest education level: 12th grade  Occupational History   Occupation: Retired  Tobacco Use   Smoking status: Former    Current packs/day: 0.00    Average packs/day: 0.8 packs/day for 10.0 years (7.5 ttl pk-yrs)    Types: Cigarettes    Start date: 04/13/1964    Quit date: 04/13/1974     Years since quitting: 49.3   Smokeless tobacco: Never   Tobacco comments:    smoking cessation materials not required  Vaping Use   Vaping status: Never Used  Substance and Sexual Activity   Alcohol use: No    Alcohol/week: 0.0 standard drinks of alcohol   Drug use: No   Sexual activity: Not Currently  Other Topics Concern   Not on file  Social History Narrative   Not on file   Social Drivers of Health   Financial Resource Strain: Low Risk  (07/30/2023)   Overall Financial Resource Strain (CARDIA)    Difficulty of Paying Living Expenses: Not hard at all  Food Insecurity: No Food Insecurity (07/30/2023)   Hunger Vital Sign    Worried About Running Out of Food in the Last Year: Never true    Ran Out of Food in the Last Year: Never true  Transportation Needs: No Transportation Needs (07/30/2023)   PRAPARE - Transportation  Lack of Transportation (Medical): No    Lack of Transportation (Non-Medical): No  Physical Activity: Inactive (07/30/2023)   Exercise Vital Sign    Days of Exercise per Week: 0 days    Minutes of Exercise per Session: 0 min  Stress: No Stress Concern Present (07/30/2023)   [employer] of Occupational Health - Occupational Stress Questionnaire    Feeling of Stress : Not at all  Social Connections: Socially Isolated (07/30/2023)   Social Connection and Isolation Panel [NHANES]    Frequency of Communication with Friends and Family: Once a week    Frequency of Social Gatherings with Friends and Family: Once a week    Attends Religious Services: Never    Database administrator or Organizations: No    Attends Engineer, structural: Never    Marital Status: Married    Tobacco Counseling Counseling given: Not Answered Tobacco comments: smoking cessation materials not required   Clinical Intake:  Pre-visit preparation completed: Yes  Pain : 0-10 Pain Score: 6  Pain Type: Chronic pain Pain Location: Back Pain Descriptors / Indicators:  Aching     BMI - recorded: 30.73 Nutritional Status: BMI > 30  Obese Nutritional Risks: None Diabetes: No  How often do you need to have someone help you when you read instructions, pamphlets, or other written materials from your doctor or pharmacy?: 1 - Never  Interpreter Needed?: No  Information entered by :: Tora Kindred, CMA   Activities of Daily Living    07/30/2023    9:13 AM 07/30/2023   12:55 AM  In your present state of health, do you have any difficulty performing the following activities:  Hearing? 0 0   Vision? 0 0   Difficulty concentrating or making decisions? 0 0   Walking or climbing stairs? 0 0   Dressing or bathing? 0 0   Doing errands, shopping? 0 0   Preparing Food and eating ? N N   Using the Toilet? N N   In the past six months, have you accidently leaked urine? N N   Do you have problems with loss of bowel control? N N   Managing your Medications? N N   Managing your Finances? N N   Housekeeping or managing your Housekeeping? Daryel Gerald      Proxy-reported     Patient Care Team: Reubin Milan, MD as PCP - General (Family Medicine) Morene Crocker, MD as Referring Physician (Neurology) Long, Staci Righter, MD as Referring Physician (Geriatric Medicine)  Indicate any recent Medical Services you may have received from other than [facility] providers in the past year (date may be approximate).     Assessment:   This is a routine wellness examination for [place].  Hearing/Vision screen Hearing Screening - Comments:: Denies hearing loss Vision Screening - Comments:: Gets eye exams. Sees [facility]   Goals Addressed             This Visit's Progress    Patient Stated       Get more active outside once the weather is nicer      Depression Screen    07/30/2023    9:27 AM 12/23/2022   10:14 AM 07/18/2022    9:33 AM 06/19/2022    9:14 AM 12/18/2021   10:13 AM 07/26/2021    1:30 PM 06/26/2021    8:49 AM  PHQ 2/9 Scores  PHQ -  2 Score 1 0 0 4 0 0 0  PHQ-  9 Score  0 2 6 4 6 3     Fall Risk    07/30/2023    9:31 AM 07/30/2023   12:55 AM 12/23/2022   10:14 AM 07/18/2022    9:34 AM 06/19/2022    9:15 AM  Fall Risk   Falls in the past year? 0 0  0 0 0  Number falls in past yr: 0  0 0 0  Injury with Fall? 0  0 0 0  Risk for fall due to : No Fall Risks  No Fall Risks No Fall Risks No Fall Risks  Follow up Falls prevention discussed  Falls evaluation completed Falls evaluation completed Falls evaluation completed     Proxy-reported     MEDICARE RISK AT HOME: Medicare Risk at Home Any stairs in or around the home?: Yes If so, are there any without handrails?: No Home free of loose throw rugs in walkways, pet beds, electrical cords, etc?: Yes Adequate lighting in your home to reduce risk of falls?: Yes Life alert?: No Use of a cane, walker or w/c?: No Grab bars in the bathroom?: No Shower chair or bench in shower?: Yes Elevated toilet seat or a handicapped toilet?: Yes  TIMED UP AND GO:  Was the test performed?  No    Cognitive Function:        07/30/2023    9:31 AM 07/18/2022    9:35 AM 01/11/2020    8:19 AM 04/18/2019    9:54 AM 04/14/2018    3:02 PM  6CIT Screen  What Year? 0 points 0 points 0 points 0 points 0 points  What month? 0 points 0 points 0 points 0 points 0 points  What time? 0 points 0 points 0 points 0 points 0 points  Count back from 20 0 points 0 points 0 points 0 points 0 points  Months in reverse 0 points 0 points 0 points 0 points 0 points  Repeat phrase 0 points 0 points 0 points 0 points 0 points  Total Score 0 points 0 points 0 points 0 points 0 points    Immunizations Immunization History  Administered Date(s) Administered   Fluad Quad(high Dose 65+) 03/22/2022   Influenza, High Dose Seasonal PF 03/05/2017, 03/11/2018, 04/11/2019, 03/18/2020, 03/26/2021, 03/23/2023   Influenza-Unspecified 03/07/2014, 02/03/2017, 04/11/2019   PFIZER(Purple Top)SARS-COV-2 Vaccination  08/19/2019, 09/09/2019, 04/05/2020, 10/22/2020   PNEUMOCOCCAL CONJUGATE-20 12/23/2022   Pfizer Covid-19 Vaccine Bivalent Booster 42yrs & up 03/15/2021   Pfizer(Comirnaty)Fall Seasonal Vaccine 12 years and older 04/18/2022, 03/24/2023   Pneumococcal Conjugate-13 03/14/2014, 09/17/2015   Pneumococcal Polysaccharide-23 04/18/2002, 11/07/2010, 04/14/2018   Tdap 11/24/2011   Zoster Recombinant(Shingrix) 09/16/2021, 11/19/2021   Zoster, Live 07/07/2008, 10/09/2014    TDAP status: Due, Education has been provided regarding the importance of this vaccine. Advised may receive this vaccine at local pharmacy or Health Dept. Aware to provide a copy of the vaccination record if obtained from local pharmacy or Health Dept. Verbalized acceptance and understanding.  Flu Vaccine status: Up to date  Pneumococcal vaccine status: Up to date  Covid-19 vaccine status: Completed vaccines  Qualifies for Shingles Vaccine? Yes   Zostavax completed Yes   Shingrix Completed?: Yes  Screening Tests Health Maintenance  Topic Date Due   DTaP/Tdap/Td (2 - Td or Tdap) 11/23/2021   Fecal DNA (Cologuard)  04/03/2023   COVID-19 Vaccine (8 - 2024-25 season) 05/19/2023   Medicare Annual Wellness (AWV)  07/29/2024   Pneumonia Vaccine 73+ Years old  Completed   INFLUENZA VACCINE  Completed   DEXA SCAN  Completed   Zoster Vaccines- Shingrix  Completed   Hepatitis C Screening  Addressed   HPV VACCINES  Aged Out   Colonoscopy  Discontinued    Health Maintenance  Health Maintenance Due  Topic Date Due   DTaP/Tdap/Td (2 - Td or Tdap) 11/23/2021   Fecal DNA (Cologuard)  04/03/2023   COVID-19 Vaccine (8 - 2024-25 season) 05/19/2023    Colorectal cancer screening: No longer required.   Mammogram status: No longer required due to bilateral mastectomy.  Bone Density status: Ordered 07/30/23. Pt provided with contact info and advised to call to schedule appt.  Lung Cancer Screening: (Low Dose CT Chest recommended  if Age 42-80 years, 20 pack-year currently smoking OR have quit w/in 15years.) does not qualify.   Lung Cancer Screening Referral: n/a  Additional Screening:  Hepatitis C Screening: does qualify;   Vision Screening: Recommended annual ophthalmology exams for early detection of glaucoma and other disorders of the eye.  Dental Screening: Recommended annual dental exams for proper oral hygiene   Community Resource Referral / Chronic Care Management: CRR required this visit?  No   CCM required this visit?  No     Plan:     I have personally reviewed and noted the following in the patient's chart:   Medical and social history Use of alcohol, tobacco or illicit drugs  Current medications and supplements including opioid prescriptions. Patient is currently taking opioid prescriptions. Information provided to patient regarding non-opioid alternatives. Patient advised to discuss non-opioid treatment plan with their provider. Functional ability and status Nutritional status Physical activity Advanced directives List of other physicians Hospitalizations, surgeries, and ER visits in previous 12 months Vitals Screenings to include cognitive, depression, and falls Referrals and appointments  In addition, I have reviewed and discussed with patient certain preventive protocols, quality metrics, and best practice recommendations. A written personalized care plan for preventive services as well as general preventive health recommendations were provided to patient.     Tora Kindred, CMA   07/30/2023   After Visit Summary: (MyChart) Due to this being a telephonic visit, the after visit summary with patients personalized plan was offered to patient via MyChart   Nurse Notes: Patient's husband, Rocky Link, present during today's visit  Needs Tdap vaccine Placed order for DEXA scan

## 2023-08-23 ENCOUNTER — Other Ambulatory Visit: Payer: Self-pay | Admitting: Internal Medicine

## 2023-08-23 ENCOUNTER — Encounter: Payer: Self-pay | Admitting: Internal Medicine

## 2023-08-23 DIAGNOSIS — M797 Fibromyalgia: Secondary | ICD-10-CM

## 2023-08-23 MED ORDER — HYDROCODONE-ACETAMINOPHEN 5-325 MG PO TABS: 1.0000 | ORAL_TABLET | Freq: Three times a day (TID) | ORAL | 0 refills | Status: DC | PRN

## 2023-08-24 DIAGNOSIS — C50412 Malignant neoplasm of upper-outer quadrant of left female breast: Secondary | ICD-10-CM | POA: Diagnosis not present

## 2023-08-24 DIAGNOSIS — G3184 Mild cognitive impairment, so stated: Secondary | ICD-10-CM | POA: Diagnosis not present

## 2023-08-24 DIAGNOSIS — M797 Fibromyalgia: Secondary | ICD-10-CM | POA: Diagnosis not present

## 2023-08-24 DIAGNOSIS — Z170 Estrogen receptor positive status [ER+]: Secondary | ICD-10-CM | POA: Diagnosis not present

## 2023-11-02 ENCOUNTER — Other Ambulatory Visit: Payer: Self-pay | Admitting: Internal Medicine

## 2023-11-02 DIAGNOSIS — G2581 Restless legs syndrome: Secondary | ICD-10-CM

## 2023-11-04 NOTE — Telephone Encounter (Signed)
 All Rx request filled for 1 year 12/29/22- too soon Requested Prescriptions  Pending Prescriptions Disp Refills   rOPINIRole  (REQUIP ) 0.5 MG tablet [Pharmacy Med Name: rOPINIRole  HCl Oral Tablet 0.5 MG] 630 tablet 3    Sig: TAKE 7 TABLETS AT BEDTIME     Neurology:  Parkinsonian Agents Failed - 11/04/2023 12:31 PM      Failed - Valid encounter within last 12 months    Recent Outpatient Visits   None     Future Appointments             In 1 month Sandra Mckinney, Sandra [place], Sandra Mckinney [facility] Primary [facility] & Sports Medicine at [facility]            Passed - Last BP in normal range    BP Readings from Last 1 Encounters:  12/23/22 126/82         Passed - Last Heart Rate in normal range    Pulse Readings from Last 1 Encounters:  12/23/22 61          propranolol  ER (INDERAL  LA) 60 MG 24 hr capsule [Pharmacy Med Name: Propranolol  HCl ER Oral Capsule Extended Release 24 Hour 60 MG] 90 capsule 3    Sig: TAKE 1 CAPSULE EVERY DAY     Cardiovascular:  Beta Blockers Failed - 11/04/2023 12:31 PM      Failed - Valid encounter within last 6 months    Recent Outpatient Visits   None     Future Appointments             In 1 month Sandra Dixons, Sandra Mckinney [facility] Primary Care & Sports Medicine at [facility], [facility]            Passed - Last BP in normal range    BP Readings from Last 1 Encounters:  12/23/22 126/82         Passed - Last Heart Rate in normal range    Pulse Readings from Last 1 Encounters:  12/23/22 61          furosemide  (LASIX ) 20 MG tablet [Pharmacy Med Name: Furosemide  Oral Tablet 20 MG] 90 tablet 3    Sig: TAKE 1 TABLET EVERY DAY (NEED Sandra Mckinney APPOINTMENT)     Cardiovascular:  Diuretics - Loop Failed - 11/04/2023 12:31 PM      Failed - K in normal range and within 180 days    Potassium  Date Value Ref Range Status  12/23/2022 4.1 3.5 - 5.1 mEq/L Final  12/23/2022 4.1 3.5 - 5.1 mEq/L Final  07/29/2012 4.1 3.5 - 5.1 mmol/L Final         Failed - Ca in  normal range and within 180 days    Calcium  Date Value Ref Range Status  12/23/2022 9.9 8.7 - 10.7 Final   Calcium, Total  Date Value Ref Range Status  07/29/2012 9.1 8.5 - 10.1 mg/dL Final         Failed - Na in normal range and within 180 days    Sodium  Date Value Ref Range Status  12/23/2022 143 137 - 147 Final  12/23/2022 143 137 - 147 Final  07/29/2012 140 136 - 145 mmol/L Final         Failed - Cr in normal range and within 180 days    Creatinine  Date Value Ref Range Status  12/23/2022 1.0 0.5 - 1.1 Final  12/23/2022 1.0 0.5 - 1.1 Final  07/29/2012 0.93 0.60 - 1.30 mg/dL  Final   Creatinine, Ser  Date Value Ref Range Status  12/06/2014 1.00 0.57 - 1.00 mg/dL Final         Failed - Cl in normal range and within 180 days    Chloride  Date Value Ref Range Status  12/23/2022 106 99 - 108 Final  12/23/2022 106 99 - 108 Final  07/29/2012 104 98 - 107 mmol/L Final         Failed - Mg Level in normal range and within 180 days    No results found for: "MG"       Failed - Valid encounter within last 6 months    Recent Outpatient Visits   None     Future Appointments             In 1 month Sandra Mckinney, Sandra [place], Sandra Mckinney [facility] Primary Care & Sports Medicine at [facility], [facility]            Passed - Last BP in normal range    BP Readings from Last 1 Encounters:  12/23/22 126/82

## 2023-11-05 ENCOUNTER — Encounter: Payer: Self-pay | Admitting: Internal Medicine

## 2023-11-18 ENCOUNTER — Other Ambulatory Visit: Payer: Self-pay | Admitting: Internal Medicine

## 2023-11-18 DIAGNOSIS — G3184 Mild cognitive impairment, so stated: Secondary | ICD-10-CM

## 2023-11-18 DIAGNOSIS — G2581 Restless legs syndrome: Secondary | ICD-10-CM

## 2023-11-19 NOTE — Telephone Encounter (Signed)
 Requested Prescriptions  Pending Prescriptions Disp Refills   rOPINIRole  (REQUIP ) 0.5 MG tablet [Pharmacy Med Name: rOPINIRole  HCl Oral Tablet 0.5 MG] 630 tablet 0    Sig: TAKE 7 TABLETS AT BEDTIME     Neurology:  Parkinsonian Agents Failed - 11/19/2023  3:04 PM      Failed - Valid encounter within last 12 months    Recent Outpatient Visits   None     Future Appointments             In 1 month Gala Jubilee, Chales [place], MD [facility] Primary Care & Sports Medicine at [facility], [facility]            Passed - Last BP in normal range    BP Readings from Last 1 Encounters:  12/23/22 126/82         Passed - Last Heart Rate in normal range    Pulse Readings from Last 1 Encounters:  12/23/22 61          propranolol  ER (INDERAL  LA) 60 MG 24 hr capsule [Pharmacy Med Name: Propranolol  HCl ER Oral Capsule Extended Release 24 Hour 60 MG] 90 capsule 0    Sig: TAKE 1 CAPSULE EVERY DAY     Cardiovascular:  Beta Blockers Failed - 11/19/2023  3:04 PM      Failed - Valid encounter within last 6 months    Recent Outpatient Visits   None     Future Appointments             In 1 month Gala Jubilee, Chales [place], MD [facility] Primary Care & Sports Medicine at [facility], [facility]            Passed - Last BP in normal range    BP Readings from Last 1 Encounters:  12/23/22 126/82         Passed - Last Heart Rate in normal range    Pulse Readings from Last 1 Encounters:  12/23/22 61          furosemide  (LASIX ) 20 MG tablet [Pharmacy Med Name: Furosemide  Oral Tablet 20 MG] 90 tablet 0    Sig: TAKE 1 TABLET EVERY DAY (NEED MD APPOINTMENT)     Cardiovascular:  Diuretics - Loop Failed - 11/19/2023  3:04 PM      Failed - K in normal range and within 180 days    Potassium  Date Value Ref Range Status  12/23/2022 4.1 3.5 - 5.1 mEq/L Final  12/23/2022 4.1 3.5 - 5.1 mEq/L Final  07/29/2012 4.1 3.5 - 5.1 mmol/L Final         Failed - Ca in normal range and within 180 days    Calcium  Date  Value Ref Range Status  12/23/2022 9.9 8.7 - 10.7 Final   Calcium, Total  Date Value Ref Range Status  07/29/2012 9.1 8.5 - 10.1 mg/dL Final         Failed - Na in normal range and within 180 days    Sodium  Date Value Ref Range Status  12/23/2022 143 137 - 147 Final  12/23/2022 143 137 - 147 Final  07/29/2012 140 136 - 145 mmol/L Final         Failed - Cr in normal range and within 180 days    Creatinine  Date Value Ref Range Status  12/23/2022 1.0 0.5 - 1.1 Final  12/23/2022 1.0 0.5 - 1.1 Final  07/29/2012 0.93 0.60 - 1.30 mg/dL Final   Creatinine, Ser  Date  Value Ref Range Status  12/06/2014 1.00 0.57 - 1.00 mg/dL Final         Failed - Cl in normal range and within 180 days    Chloride  Date Value Ref Range Status  12/23/2022 106 99 - 108 Final  12/23/2022 106 99 - 108 Final  07/29/2012 104 98 - 107 mmol/L Final         Failed - Mg Level in normal range and within 180 days    No results found for: "MG"       Failed - Valid encounter within last 6 months    Recent Outpatient Visits   None     Future Appointments             In 1 month Gala Jubilee, Chales [place], MD [facility] Primary Care & Sports Medicine at [facility], [facility]            Passed - Last BP in normal range    BP Readings from Last 1 Encounters:  12/23/22 126/82          memantine  (NAMENDA ) 5 MG tablet [Pharmacy Med Name: Memantine  HCl Oral Tablet 5 MG] 180 tablet 0    Sig: TAKE 1 TABLET TWICE DAILY     Neurology:  Alzheimer's Agents 2 Failed - 11/19/2023  3:04 PM      Failed - Valid encounter within last 6 months    Recent Outpatient Visits   None     Future Appointments             In 1 month Gala Jubilee, Chales [place], MD [facility] Health Primary Care & Sports Medicine at [facility], [facility]            Passed - Cr in normal range and within 360 days    Creatinine  Date Value Ref Range Status  12/23/2022 1.0 0.5 - 1.1 Final  12/23/2022 1.0 0.5 - 1.1 Final  07/29/2012 0.93 0.60 - 1.30  mg/dL Final   Creatinine, Ser  Date Value Ref Range Status  12/06/2014 1.00 0.57 - 1.00 mg/dL Final         Passed - eGFR is 5 or above and within 360 days    EGFR (African American)  Date Value Ref Range Status  07/29/2012 >60  Final   GFR calc Af Amer  Date Value Ref Range Status  12/06/2014 68 >59 mL/min/1.73 Final   EGFR (Non-African Amer.)  Date Value Ref Range Status  07/29/2012 >60  Final    Comment:    eGFR values <51mL/min/1.73 m2 may be an indication of chronic kidney disease (CKD). Calculated eGFR is useful in patients with stable renal function. The eGFR calculation will not be reliable in acutely ill patients when serum creatinine is changing rapidly. It is not useful in  patients on dialysis. The eGFR calculation may not be applicable to patients at the low and high extremes of body sizes, pregnant women, and vegetarians.    GFR calc non Af Amer  Date Value Ref Range Status  04/19/2020 56  Final   eGFR  Date Value Ref Range Status  12/23/2022 57  Final  12/23/2022 57  Final

## 2023-11-23 ENCOUNTER — Other Ambulatory Visit: Payer: Self-pay | Admitting: Internal Medicine

## 2023-11-23 ENCOUNTER — Encounter: Payer: Self-pay | Admitting: Internal Medicine

## 2023-11-23 DIAGNOSIS — M797 Fibromyalgia: Secondary | ICD-10-CM

## 2023-11-23 MED ORDER — HYDROCODONE-ACETAMINOPHEN 5-325 MG PO TABS
1.0000 | ORAL_TABLET | Freq: Three times a day (TID) | ORAL | 0 refills | Status: AC | PRN
Start: 2023-11-23 — End: 2023-12-23

## 2023-12-21 ENCOUNTER — Other Ambulatory Visit: Payer: Self-pay | Admitting: Internal Medicine

## 2023-12-22 ENCOUNTER — Other Ambulatory Visit: Payer: Self-pay | Admitting: Internal Medicine

## 2023-12-22 ENCOUNTER — Encounter: Payer: Self-pay | Admitting: Internal Medicine

## 2023-12-22 DIAGNOSIS — M797 Fibromyalgia: Secondary | ICD-10-CM

## 2023-12-22 MED ORDER — HYDROCODONE-ACETAMINOPHEN 5-325 MG PO TABS: 1.0000 | ORAL_TABLET | Freq: Three times a day (TID) | ORAL | 0 refills | Status: DC | PRN

## 2023-12-22 NOTE — Telephone Encounter (Signed)
 Please review.  KP

## 2023-12-23 NOTE — Telephone Encounter (Signed)
 Requested Prescriptions  Refused Prescriptions Disp Refills   busPIRone  (BUSPAR ) 10 MG tablet [Pharmacy Med Name: busPIRone  HCl Oral Tablet 10 MG] 360 tablet 3    Sig: TAKE 1 TABLET FOUR TIMES DAILY     Psychiatry: Anxiolytics/Hypnotics - Non-controlled Failed - 12/23/2023  4:40 PM      Failed - Valid encounter within last 12 months    Recent Outpatient Visits   None     Future Appointments             In 2 days Gala Jubilee, Chales [place], MD [facility] Health Primary Care & Sports Medicine at [facility], [facility]

## 2023-12-25 ENCOUNTER — Encounter: Payer: Self-pay | Admitting: Internal Medicine

## 2023-12-25 ENCOUNTER — Ambulatory Visit (INDEPENDENT_AMBULATORY_CARE_PROVIDER_SITE_OTHER): Payer: Self-pay | Admitting: Internal Medicine

## 2023-12-25 VITALS — BP 122/78 | HR 56 | Ht 62.0 in | Wt 169.6 lb

## 2023-12-25 DIAGNOSIS — G3184 Mild cognitive impairment, so stated: Secondary | ICD-10-CM

## 2023-12-25 DIAGNOSIS — E782 Mixed hyperlipidemia: Secondary | ICD-10-CM

## 2023-12-25 DIAGNOSIS — Z0000 Encounter for general adult medical examination without abnormal findings: Secondary | ICD-10-CM

## 2023-12-25 DIAGNOSIS — M797 Fibromyalgia: Secondary | ICD-10-CM

## 2023-12-25 DIAGNOSIS — Z131 Encounter for screening for diabetes mellitus: Secondary | ICD-10-CM

## 2023-12-25 DIAGNOSIS — Z1211 Encounter for screening for malignant neoplasm of colon: Secondary | ICD-10-CM

## 2023-12-25 DIAGNOSIS — F3181 Bipolar II disorder: Secondary | ICD-10-CM | POA: Diagnosis not present

## 2023-12-25 DIAGNOSIS — G43009 Migraine without aura, not intractable, without status migrainosus: Secondary | ICD-10-CM

## 2023-12-25 DIAGNOSIS — G2581 Restless legs syndrome: Secondary | ICD-10-CM

## 2023-12-25 LAB — LIPID PANEL
Cholesterol: 198 (ref 0–200)
HDL: 86 — AB (ref 35–70)
LDL Cholesterol: 82
Triglycerides: 198 — AB (ref 40–160)

## 2023-12-25 LAB — HEPATIC FUNCTION PANEL
ALT: 11 U/L (ref 7–35)
AST: 20 (ref 13–35)
Alkaline Phosphatase: 81 (ref 25–125)
Bilirubin, Total: 0.3

## 2023-12-25 LAB — BASIC METABOLIC PANEL WITH GFR
BUN: 13 (ref 4–21)
CO2: 31 — AB (ref 13–22)
Chloride: 100 (ref 99–108)
Creatinine: 0.9 (ref 0.5–1.1)
Glucose: 100
Potassium: 4.3 meq/L (ref 3.5–5.1)
Sodium: 143 (ref 137–147)

## 2023-12-25 LAB — COMPREHENSIVE METABOLIC PANEL WITH GFR
Calcium: 9.8 (ref 8.7–10.7)
eGFR: 61

## 2023-12-25 MED ORDER — BUSPIRONE HCL 10 MG PO TABS: ORAL_TABLET | ORAL | 3 refills | Status: AC

## 2023-12-25 NOTE — Assessment & Plan Note (Addendum)
 No longer on Namenda  and Aricept. Stable deficits per husband without concerns at this time.

## 2023-12-25 NOTE — Assessment & Plan Note (Signed)
 Managed well with Requip  nightly.

## 2023-12-25 NOTE — Assessment & Plan Note (Signed)
 Diet only. Lab Results  Component Value Date   LDLCALC 82 12/23/2022

## 2023-12-25 NOTE — Progress Notes (Signed)
 Date:  12/25/2023   Name:  Sandra Mckinney   DOB:  [birth year]   MRN:  [id number]   Chief Complaint: Annual Exam (Pt refused BP check in both arms.) Sandra Mckinney is a 78 y.o. female who presents today for her Complete Annual Exam. She feels fairly well. She reports exercising none. She reports she is sleeping well. Breast complaints NA.  Health Maintenance  Topic Date Due   DTaP/Tdap/Td vaccine (2 - Td or Tdap) 11/23/2021   COVID-19 Vaccine (8 - Pfizer risk 2024-25 season) 09/21/2023   Flu Shot  02/05/2024   Medicare Annual Wellness Visit  07/29/2024   Pneumococcal Vaccine for age over 79  Completed   DEXA scan (bone density measurement)  Completed   Zoster (Shingles) Vaccine  Completed   Hepatitis C Screening  Addressed   HPV Vaccine  Aged Out   Meningitis B Vaccine  Aged Out   Colon Cancer Screening  Discontinued   Cologuard (Stool DNA test)  Discontinued   Depression        This is a chronic (treated by Psych) problem.The problem is unchanged.  Associated symptoms include fatigue and myalgias.  Associated symptoms include no headaches.  Treatments tried: on Lamictal, Trazodone and Buspar . Fibromyalgia - chronic stable diffuse pain managed with PRN Skelaxin  and Hydrocodone . RLS - on Requip  nightly with good control of symptoms.   Review of Systems  Constitutional:  Positive for fatigue. Negative for unexpected weight change.  HENT:  Negative for trouble swallowing.   Eyes:  Negative for visual disturbance.  Respiratory:  Negative for cough, chest tightness, shortness of breath and wheezing.   Cardiovascular:  Negative for chest pain, palpitations and leg swelling.  Gastrointestinal:  Negative for abdominal pain, constipation and diarrhea.  Genitourinary:  Negative for frequency and urgency.  Musculoskeletal:  Positive for myalgias. Negative for arthralgias.  Neurological:  Negative for dizziness, weakness, light-headedness and headaches.   Psychiatric/Behavioral:  Positive for confusion, depression and sleep disturbance. Negative for dysphoric mood. The patient is not nervous/anxious.      Lab Results  Component Value Date   NA 143 12/23/2022   NA 143 12/23/2022   K 4.1 12/23/2022   K 4.1 12/23/2022   CO2 31 (A) 12/23/2022   CO2 31 (A) 12/23/2022   GLUCOSE 88 12/06/2014   BUN 11 12/23/2022   BUN 11 12/23/2022   CREATININE 1.0 12/23/2022   CREATININE 1.0 12/23/2022   CALCIUM 9.9 12/23/2022   EGFR 57 12/23/2022   EGFR 57 12/23/2022   GFRNONAA 56 04/19/2020   Lab Results  Component Value Date   CHOL 201 (A) 12/23/2022   HDL 98 (A) 12/23/2022   LDLCALC 82 12/23/2022   TRIG 104 12/23/2022   CHOLHDL 2.3 04/16/2016   Lab Results  Component Value Date   TSH 0.59 12/23/2022   Lab Results  Component Value Date   HGBA1C 5.6 09/18/2021   Lab Results  Component Value Date   WBC 6.8 12/23/2022   HGB 13.0 12/23/2022   HCT 39 12/23/2022   MCV 88 07/29/2012   PLT 202 12/23/2022   Lab Results  Component Value Date   ALT 14 12/23/2022   AST 25 12/23/2022   ALKPHOS 89 12/23/2022   Lab Results  Component Value Date   VD25OH 51 09/18/2021     Patient Active Problem List   Diagnosis Date Noted   Primary osteoarthritis of both hands 07/26/2021   Chronic rhinitis 07/26/2021   Mild cognitive impairment 02/08/2020  Narcotic use agreement exists 12/14/2019   Acquired absence of both breasts 06/22/2019   Fibromyalgia 07/29/2016   Mixed hyperlipidemia 05/20/2016   Age-related osteoporosis without current pathological fracture 04/16/2016   Anemia 12/12/2015   Neoplasm of left breast, primary tumor staging category Tis: ductal carcinoma in situ (DCIS) 01/03/2015   History of left breast cancer 01/03/2015   Neoplasm of right breast, primary tumor staging category Tis: ductal carcinoma in situ (DCIS) 12/27/2014   Anxiety disorder 10/11/2014   Plantar fasciitis 10/11/2014   Local edema 10/11/2014   Bipolar 2  disorder, major depressive episode (HCC) 10/11/2014   Hot flash, menopausal 10/11/2014   Migraine without aura and responsive to treatment 10/11/2014   Idiopathic insomnia 10/11/2014   Psoriasis 10/11/2014   Restless leg 10/11/2014   Avitaminosis D 10/11/2014    Allergies  Allergen Reactions   Cephalexin Hives   Letrozole Swelling   Raloxifene Hives   Selective Estrogen Receptor Modulators Hives   Citalopram Other (See Comments)    Dry mouth    Cephalosporins    Duloxetine Hcl    Exemestane Other (See Comments)   Lyrica [Pregabalin]    Oxycodone    Penicillins    Prilosec  [Omeprazole]    Sulfa Antibiotics     Past Surgical History:  Procedure Laterality Date   ABDOMINAL HYSTERECTOMY  1986   uterine fibroids and endometriosis   BREAST BIOPSY Right 06/01/12   us  bx/clip-neg   BREAST IMPLANT REMOVAL Bilateral 10/19/2019   BREAST RECONSTRUCTION Bilateral 2017   BREAST REDUCTION SURGERY  10/19/2019   all breast reconstructions removed   CARPAL TUNNEL RELEASE Right 2014   EYE SURGERY Bilateral 2009   catarcts   MASTECTOMY, RADICAL Bilateral 2016   REDUCTION MAMMAPLASTY     2001   THUMB ARTHROSCOPY Right 2014    Social History   Tobacco Use   Smoking status: Former    Current packs/day: 0.00    Average packs/day: 0.8 packs/day for 10.0 years (7.5 ttl pk-yrs)    Types: Cigarettes    Start date: 04/13/1964    Quit date: 04/13/1974    Years since quitting: 49.7   Smokeless tobacco: Never   Tobacco comments:    smoking cessation materials not required  Vaping Use   Vaping status: Never Used  Substance Use Topics   Alcohol use: No    Alcohol/week: 0.0 standard drinks of alcohol   Drug use: No     Medication list has been reviewed and updated.  Current Meds  Medication Sig   aspirin-acetaminophen -caffeine (EXCEDRIN MIGRAINE) 250-250-65 MG tablet Take by mouth every 6 (six) hours as needed for headache.   Calcium-Magnesium-Vitamin D  600-40-500 MG-MG-UNIT  TB24 Take 1 tablet by mouth daily.   chlorpheniramine (CHLOR-TRIMETON) 4 MG tablet Take 4 mg by mouth daily.   cholecalciferol (VITAMIN D ) 1000 UNITS tablet Take 1 tablet by mouth daily.   ferrous sulfate 325 (65 FE) MG tablet Take 325 mg by mouth daily with breakfast.   furosemide  (LASIX ) 20 MG tablet TAKE 1 TABLET EVERY DAY (NEED MD APPOINTMENT)   glucosamine-chondroitin 500-400 MG tablet Take 1 tablet by mouth 2 (two) times daily.   HYDROcodone -acetaminophen  (NORCO/VICODIN) 5-325 MG tablet Take 1 tablet by mouth every 8 (eight) hours as needed for moderate pain (pain score 4-6). Fibromyalgia.   lamoTRIgine (LAMICTAL) 25 MG tablet Take 50 mg by mouth in the morning and at bedtime.   metaxalone  (SKELAXIN ) 800 MG tablet TAKE 1 TABLET THREE TIMES DAILY AS NEEDED FOR MUSCLE  SPASM(S)   MULTIPLE VITAMINS-MINERALS PO Take 1 tablet by mouth daily.   propranolol  ER (INDERAL  LA) 60 MG 24 hr capsule TAKE 1 CAPSULE EVERY DAY   rOPINIRole  (REQUIP ) 0.5 MG tablet TAKE 7 TABLETS AT BEDTIME   traZODone (DESYREL) 50 MG tablet Take 50 mg by mouth at bedtime. 1.5 tablets =75mg    [DISCONTINUED] busPIRone  (BUSPAR ) 10 MG tablet TAKE 1 TABLET FOUR TIMES DAILY       12/25/2023    9:56 AM 12/23/2022   10:14 AM 06/19/2022    9:15 AM 12/18/2021   10:13 AM  GAD 7 : Generalized Anxiety Score  Nervous, Anxious, on Edge 0 1 0 0  Control/stop worrying 0 0 0 0  Worry too much - different things 0 0 0 0  Trouble relaxing 0 0 0 0  Restless 0 0 0 0  Easily annoyed or irritable 0 1 0 1  Afraid - awful might happen 0 0 0 0  Total GAD 7 Score 0 2 0 1  Anxiety Difficulty Not difficult at all Not difficult at all Not difficult at all Not difficult at all       12/25/2023    9:56 AM 07/30/2023    9:27 AM 12/23/2022   10:14 AM  Depression screen PHQ 2/9  Decreased Interest 0 0 0  Down, Depressed, Hopeless 0 1 0  PHQ - 2 Score 0 1 0  Altered sleeping 2  0  Tired, decreased energy 0  0  Change in appetite 0  0  Feeling  bad or failure about yourself  0  0  Trouble concentrating 0  0  Moving slowly or fidgety/restless 0  0  Suicidal thoughts 0    PHQ-9 Score 2  0  Difficult doing work/chores Not difficult at all  Not difficult at all    BP Readings from Last 3 Encounters:  12/25/23 122/78  12/23/22 126/82  06/19/22 110/78    Physical Exam Vitals and nursing note reviewed.  Constitutional:      General: She is not in acute distress.    Appearance: She is well-developed.  HENT:     Head: Normocephalic and atraumatic.     Right Ear: Tympanic membrane and ear canal normal.     Left Ear: Tympanic membrane and ear canal normal.     Nose:     Right Sinus: No maxillary sinus tenderness.     Left Sinus: No maxillary sinus tenderness.   Eyes:     General: No scleral icterus.       Right eye: No discharge.        Left eye: No discharge.     Conjunctiva/sclera: Conjunctivae normal.   Neck:     Thyroid : No thyromegaly.     Vascular: No carotid bruit.   Cardiovascular:     Rate and Rhythm: Normal rate and regular rhythm.     Pulses: Normal pulses.     Heart sounds: Normal heart sounds.  Pulmonary:     Effort: Pulmonary effort is normal. No respiratory distress.     Breath sounds: No wheezing.  Abdominal:     General: Bowel sounds are normal.     Palpations: Abdomen is soft.     Tenderness: There is no abdominal tenderness.   Musculoskeletal:     Cervical back: Normal range of motion. No erythema.     Right lower leg: No edema.     Left lower leg: No edema.  Lymphadenopathy:     Cervical:  No cervical adenopathy.   Skin:    General: Skin is warm and dry.     Findings: No rash.   Neurological:     Mental Status: She is alert and oriented to person, place, and time.     Cranial Nerves: No cranial nerve deficit.     Sensory: No sensory deficit.     Deep Tendon Reflexes: Reflexes are normal and symmetric.   Psychiatric:        Attention and Perception: Attention normal.        Mood and  Affect: Mood normal.        Behavior: Behavior normal.     Wt Readings from Last 3 Encounters:  12/25/23 169 lb 9.6 oz (76.9 kg)  07/30/23 168 lb (76.2 kg)  12/23/22 177 lb (80.3 kg)    BP 122/78 (BP Location: Right Leg, Cuff Size: Normal)   Pulse (!) 56   Ht 5' 2 (1.575 m)   Wt 169 lb 9.6 oz (76.9 kg)   SpO2 96%   BMI 31.02 kg/m   Assessment and Plan:  Problem List Items Addressed This Visit       Unprioritized   Migraine without aura and responsive to treatment (Chronic)   Occasional headache responds to otc medications.       Restless leg (Chronic)   Managed well with Requip  nightly.      Fibromyalgia (Chronic)   Chronic diffuse pain - mild variation day to day and generally take hydrocodone  2-3 times per day with good relief. She uses Metaxalone  prn only. She also managed with activity moderate and rest. No evidence of misuse of narcotics.      Relevant Orders   CBC with Differential/Platelet   Comprehensive metabolic panel with GFR   TSH   Mild cognitive impairment (Chronic)   No longer on Namenda  and Aricept. Stable deficits per husband without concerns at this time.      Bipolar 2 disorder, major depressive episode (HCC)   No longer seeing Psych Doing well on lamictal and Buspar .      Relevant Medications   busPIRone  (BUSPAR ) 10 MG tablet   Mixed hyperlipidemia   Diet only. Lab Results  Component Value Date   LDLCALC 82 12/23/2022          Relevant Orders   Lipid panel   Other Visit Diagnoses       Annual physical exam    -  Primary   continue healthy diet, exercise as able     Colon cancer screening       positive cologuard in 2021 declined colonoscopy due to intolerance of the prep     Screening for diabetes mellitus       Relevant Orders   Hemoglobin A1c       Return in about 6 months (around 06/25/2024) for Depression.    Sheron Dixons, MD [facility] Primary Care and Sports Medicine [place]

## 2023-12-25 NOTE — Assessment & Plan Note (Signed)
 Occasional headache responds to otc medications.

## 2023-12-25 NOTE — Assessment & Plan Note (Signed)
 Chronic diffuse pain - mild variation day to day and generally take hydrocodone  2-3 times per day with good relief. She uses Metaxalone  prn only. She also managed with activity moderate and rest. No evidence of misuse of narcotics.

## 2023-12-25 NOTE — Assessment & Plan Note (Signed)
 No longer seeing Psych Doing well on lamictal and Buspar .

## 2023-12-28 IMAGING — CR DG KNEE COMPLETE 4+V*L*
4 series · 4 of 4 positions shown · non-contrast
Comparison: None.

CLINICAL DATA: Left knee pain

EXAM:
LEFT KNEE - COMPLETE 4+ VIEW

[knee ap]
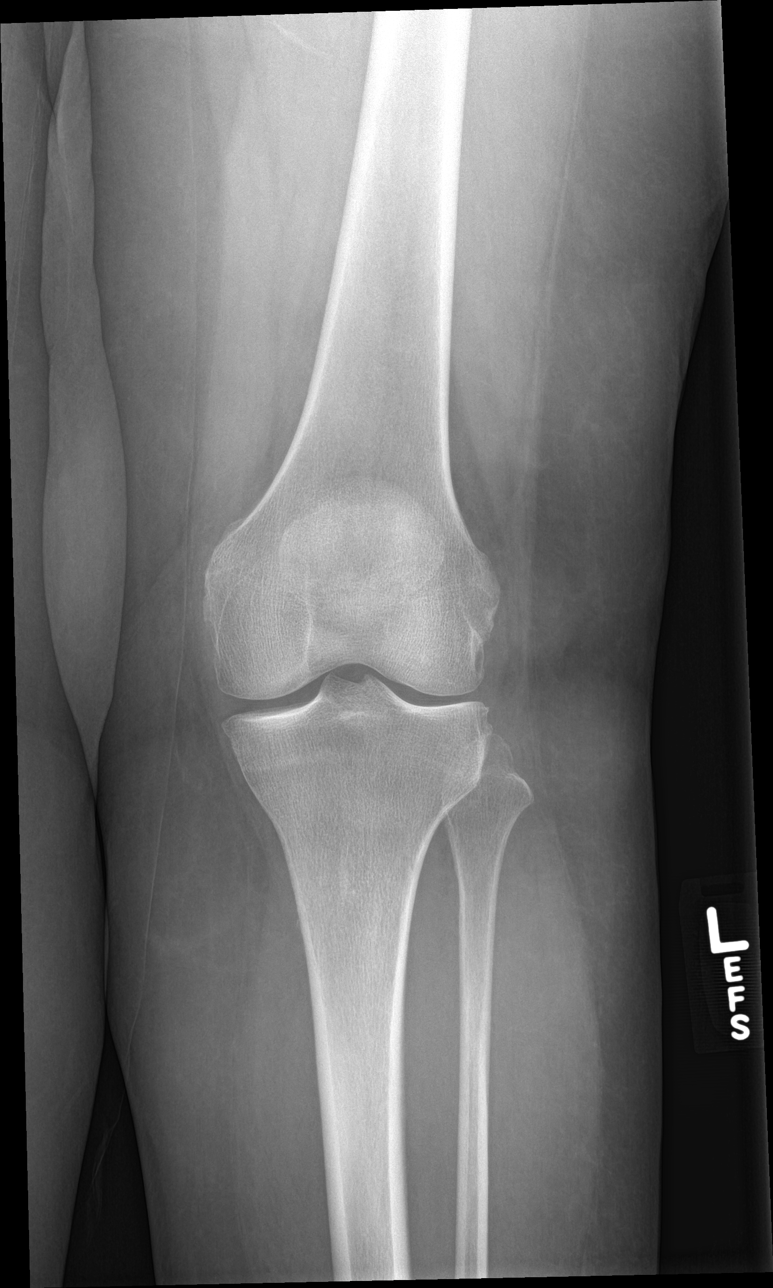

[knee lat]
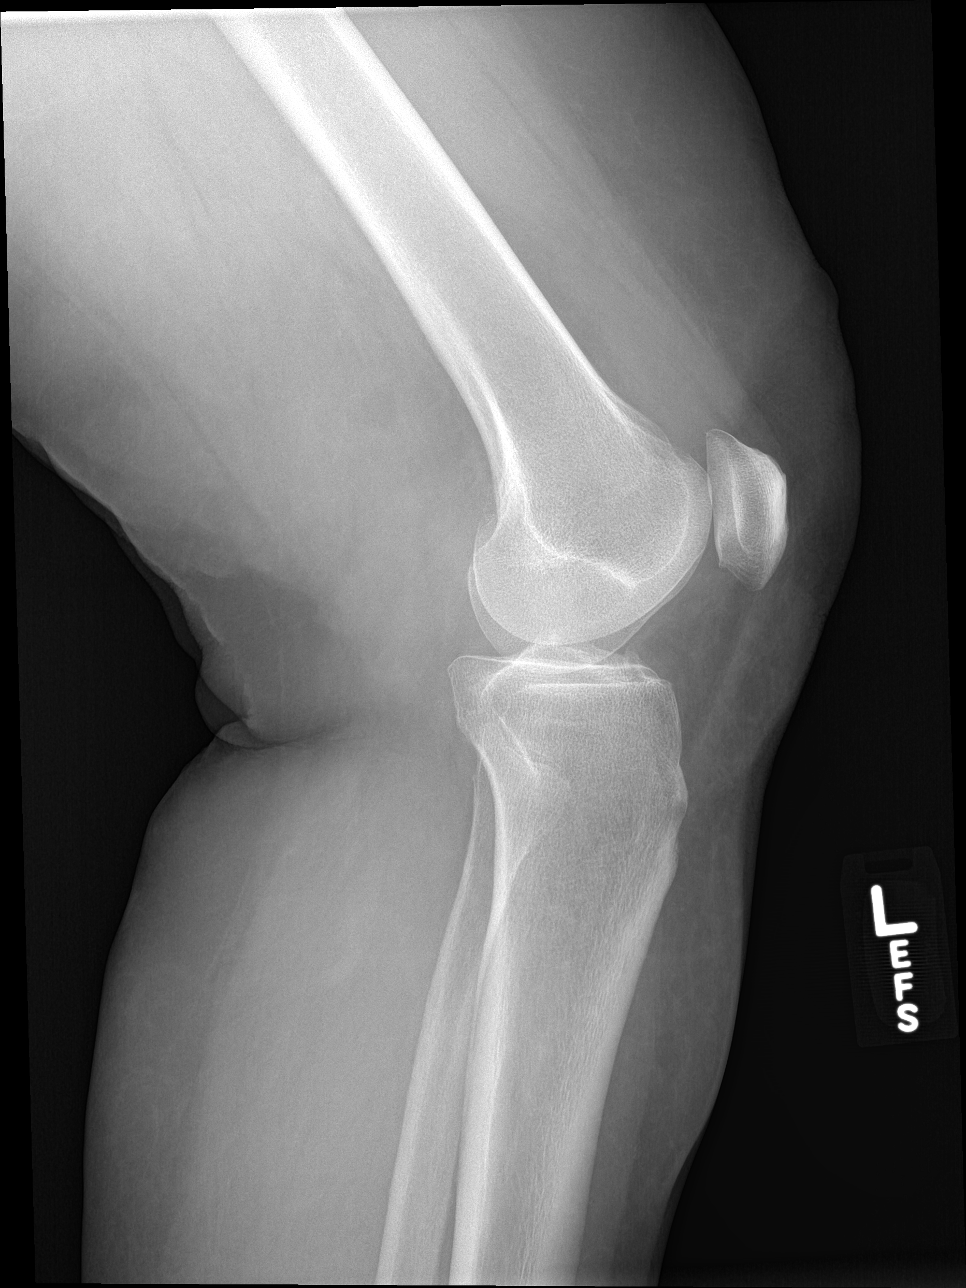

[tunnel]
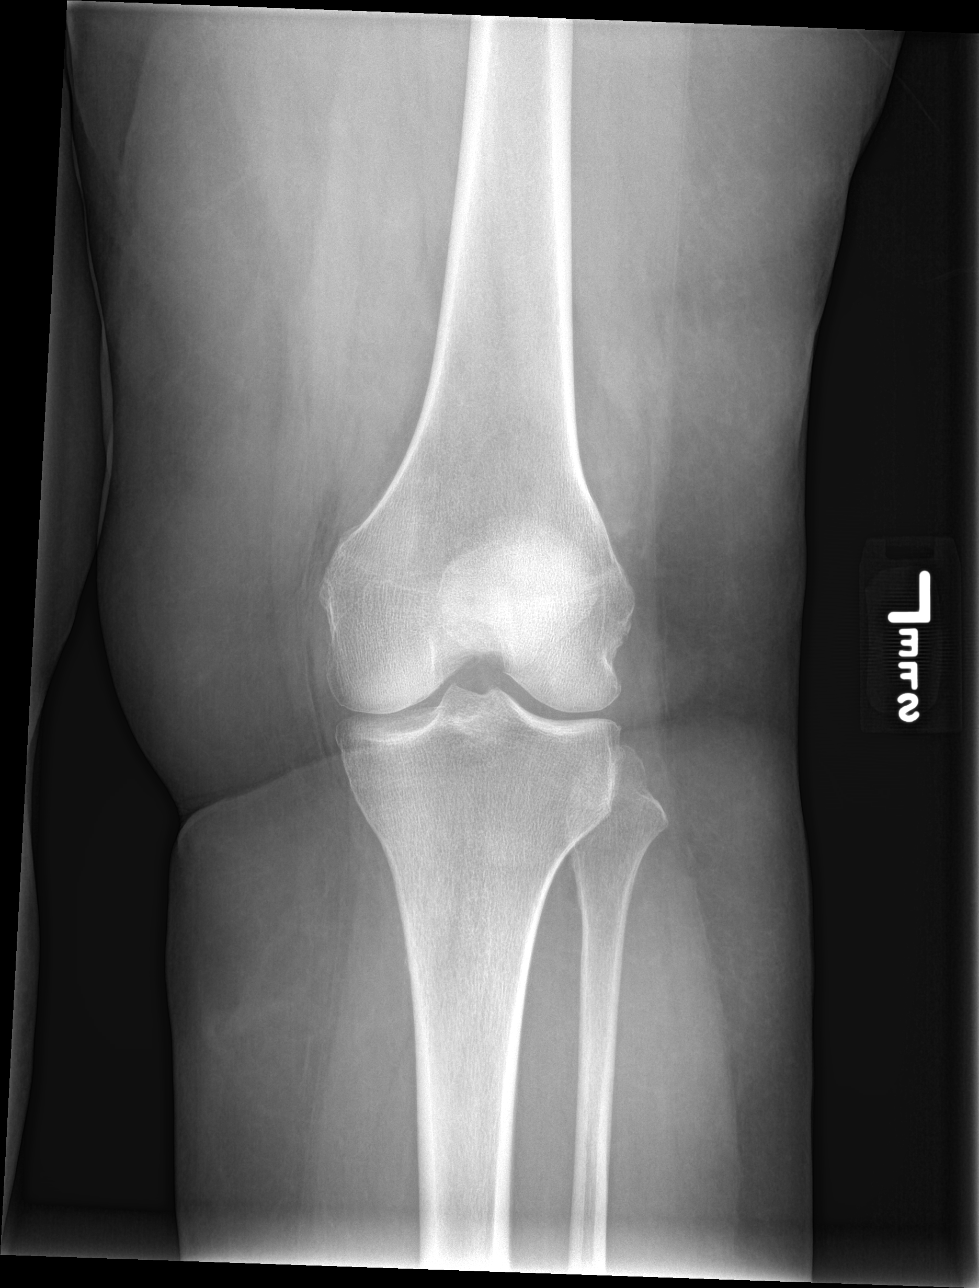

[patella skyline]
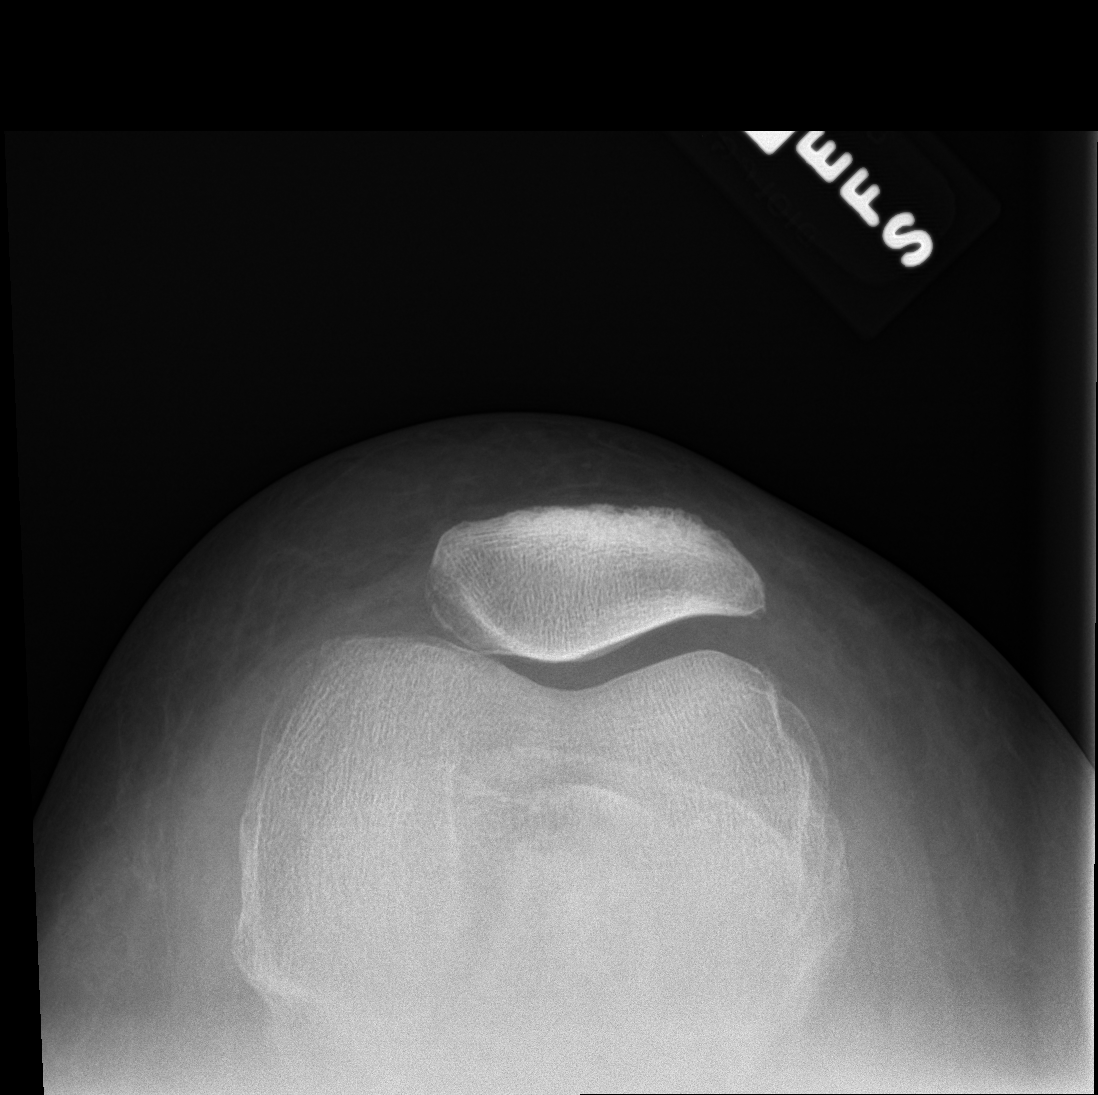

[4 of 4 positions shown; findings below may reference images not displayed]

FINDINGS: Mild-to-moderate lateral compartment joint space narrowing.
Mild-to-moderate joint effusion. Very mild patellofemoral joint
space narrowing with mild lateral patellar degenerative osteophyte.
No acute fracture is seen. No dislocation.
IMPRESSION: Mild lateral compartment osteoarthritis.

Mild-to-moderate joint effusion.

## 2023-12-30 ENCOUNTER — Encounter: Payer: Self-pay | Admitting: Internal Medicine

## 2023-12-30 ENCOUNTER — Ambulatory Visit: Payer: Self-pay | Admitting: Internal Medicine

## 2023-12-30 LAB — LAB REPORT - SCANNED
EGFR: 61
TSH: 0.36 — AB (ref 0.41–5.90)

## 2023-12-30 LAB — HEMOGLOBIN A1C: A1c: 5.8

## 2024-01-21 ENCOUNTER — Other Ambulatory Visit: Payer: Self-pay | Admitting: Internal Medicine

## 2024-01-21 ENCOUNTER — Encounter: Payer: Self-pay | Admitting: Internal Medicine

## 2024-01-21 DIAGNOSIS — M797 Fibromyalgia: Secondary | ICD-10-CM

## 2024-01-21 MED ORDER — HYDROCODONE-ACETAMINOPHEN 5-325 MG PO TABS: 1.0000 | ORAL_TABLET | Freq: Three times a day (TID) | ORAL | 0 refills | Status: DC | PRN

## 2024-02-15 ENCOUNTER — Other Ambulatory Visit: Payer: Self-pay | Admitting: Internal Medicine

## 2024-02-15 DIAGNOSIS — G2581 Restless legs syndrome: Secondary | ICD-10-CM

## 2024-02-18 NOTE — Telephone Encounter (Signed)
 Requested Prescriptions  Pending Prescriptions Disp Refills   rOPINIRole  (REQUIP ) 0.5 MG tablet [Pharmacy Med Name: ROPINIROLE  HYDROCHLORIDE 0.5 MG Oral Tablet] 630 tablet 1    Sig: TAKE 7 TABLETS AT BEDTIME     Neurology:  Parkinsonian Agents Passed - 02/18/2024  2:09 PM      Passed - Last BP in normal range    BP Readings from Last 1 Encounters:  12/25/23 122/78         Passed - Last Heart Rate in normal range    Pulse Readings from Last 1 Encounters:  12/25/23 (!) 56         Passed - Valid encounter within last 12 months    Recent Outpatient Visits           1 month ago Annual physical exam   [facility], Leita DEL, MD       Future Appointments             In 4 months Justus, Leita DEL, MD [facility] at [facility], [facility]             furosemide  (LASIX ) 20 MG tablet [Pharmacy Med Name: FUROSEMIDE  20 MG Oral Tablet] 90 tablet 1    Sig: TAKE 1 TABLET EVERY DAY (NEED MD APPOINTMENT)     Cardiovascular:  Diuretics - Loop Failed - 02/18/2024  2:09 PM      Failed - Mg Level in normal range and within 180 days    No results found for: MG       Passed - K in normal range and within 180 days    Potassium  Date Value Ref Range Status  12/25/2023 4.3 3.5 - 5.1 mEq/L Final  07/29/2012 4.1 3.5 - 5.1 mmol/L Final         Passed - Ca in normal range and within 180 days    Calcium  Date Value Ref Range Status  12/25/2023 9.8 8.7 - 10.7 Final   Calcium, Total  Date Value Ref Range Status  07/29/2012 9.1 8.5 - 10.1 mg/dL Final         Passed - Na in normal range and within 180 days    Sodium  Date Value Ref Range Status  12/25/2023 143 137 - 147 Final  07/29/2012 140 136 - 145 mmol/L Final         Passed - Cr in normal range and within 180 days    Creatinine  Date Value Ref Range Status  12/25/2023 0.9 0.5 - 1.1 Final  07/29/2012 0.93 0.60 - 1.30 mg/dL Final    Creatinine, Ser  Date Value Ref Range Status  12/06/2014 1.00 0.57 - 1.00 mg/dL Final         Passed - Cl in normal range and within 180 days    Chloride  Date Value Ref Range Status  12/25/2023 100 99 - 108 Final  07/29/2012 104 98 - 107 mmol/L Final         Passed - Last BP in normal range    BP Readings from Last 1 Encounters:  12/25/23 122/78         Passed - Valid encounter within last 6 months    Recent Outpatient Visits           1 month ago Annual physical exam   [facility] at [facility], Leita DEL, MD  Future Appointments             In 4 months Justus, Leita DEL, MD [facility] at [facility], [facility]             propranolol  ER (INDERAL  LA) 60 MG 24 hr capsule [Pharmacy Med Name: PROPRANOLOL  HYDROCHLORIDEER 60 MG Oral Capsule Extended Release 24 Hour] 90 capsule 1    Sig: TAKE 1 CAPSULE EVERY DAY     Cardiovascular:  Beta Blockers Passed - 02/18/2024  2:09 PM      Passed - Last BP in normal range    BP Readings from Last 1 Encounters:  12/25/23 122/78         Passed - Last Heart Rate in normal range    Pulse Readings from Last 1 Encounters:  12/25/23 (!) 56         Passed - Valid encounter within last 6 months    Recent Outpatient Visits           1 month ago Annual physical exam   [facility] at [facility], Leita DEL, MD       Future Appointments             In 4 months Justus, Leita DEL, MD [facility] at [facility], [facility]

## 2024-02-22 ENCOUNTER — Encounter: Payer: Self-pay | Admitting: Internal Medicine

## 2024-02-22 ENCOUNTER — Other Ambulatory Visit: Payer: Self-pay | Admitting: Internal Medicine

## 2024-02-22 DIAGNOSIS — M797 Fibromyalgia: Secondary | ICD-10-CM

## 2024-02-22 MED ORDER — HYDROCODONE-ACETAMINOPHEN 5-325 MG PO TABS: 1.0000 | ORAL_TABLET | Freq: Three times a day (TID) | ORAL | 0 refills | Status: DC | PRN

## 2024-02-22 NOTE — Telephone Encounter (Signed)
 Please review.  KP

## 2024-03-01 ENCOUNTER — Ambulatory Visit
Admission: RE | Admit: 2024-03-01 | Discharge: 2024-03-01 | Disposition: A | Discharge status: Home / Self Care | Source: Ambulatory Visit | Attending: Internal Medicine | Admitting: Internal Medicine

## 2024-03-01 DIAGNOSIS — Z780 Asymptomatic menopausal state: Secondary | ICD-10-CM | POA: Diagnosis present

## 2024-03-02 ENCOUNTER — Encounter: Payer: Self-pay | Admitting: Internal Medicine

## 2024-03-02 ENCOUNTER — Ambulatory Visit: Payer: Self-pay | Admitting: Internal Medicine

## 2024-03-12 ENCOUNTER — Encounter: Payer: Self-pay | Admitting: Internal Medicine

## 2024-03-14 ENCOUNTER — Other Ambulatory Visit: Payer: Self-pay | Admitting: Internal Medicine

## 2024-03-14 DIAGNOSIS — M797 Fibromyalgia: Secondary | ICD-10-CM

## 2024-03-14 MED ORDER — METAXALONE 800 MG PO TABS: 800.0000 mg | ORAL_TABLET | Freq: Three times a day (TID) | ORAL | 0 refills | Status: DC | PRN

## 2024-03-14 NOTE — Telephone Encounter (Signed)
 Please review. Last refill 07/14/22.  KP

## 2024-03-22 ENCOUNTER — Encounter: Payer: Self-pay | Admitting: Internal Medicine

## 2024-03-23 ENCOUNTER — Other Ambulatory Visit: Payer: Self-pay | Admitting: Internal Medicine

## 2024-03-23 DIAGNOSIS — M797 Fibromyalgia: Secondary | ICD-10-CM

## 2024-03-23 MED ORDER — HYDROCODONE-ACETAMINOPHEN 5-325 MG PO TABS: 1.0000 | ORAL_TABLET | Freq: Three times a day (TID) | ORAL | 0 refills | Status: DC | PRN

## 2024-03-23 NOTE — Telephone Encounter (Signed)
 Please review.  KP

## 2024-05-28 ENCOUNTER — Encounter: Payer: Self-pay | Admitting: Internal Medicine

## 2024-05-30 ENCOUNTER — Other Ambulatory Visit: Payer: Self-pay | Admitting: Internal Medicine

## 2024-05-30 DIAGNOSIS — M797 Fibromyalgia: Secondary | ICD-10-CM

## 2024-05-31 ENCOUNTER — Other Ambulatory Visit (HOSPITAL_BASED_OUTPATIENT_CLINIC_OR_DEPARTMENT_OTHER): Payer: Self-pay

## 2024-05-31 ENCOUNTER — Other Ambulatory Visit (HOSPITAL_COMMUNITY): Payer: Self-pay

## 2024-05-31 ENCOUNTER — Telehealth: Payer: Self-pay | Admitting: Pharmacy Technician

## 2024-05-31 NOTE — Telephone Encounter (Signed)
 PA request has been Started. New Encounter has been or will be created for follow up. For additional info see Pharmacy Prior Auth telephone encounter from 05/31/24.

## 2024-05-31 NOTE — Telephone Encounter (Signed)
 Pharmacy Patient Advocate Encounter   Received notification from Patient Advice Request messages that prior authorization for busPIRone  HCl 10MG  tablets  is required/requested.   Insurance verification completed.   The patient is insured through [place].   Per test claim: PA required; PA started via CoverMyMeds. KEY [id number] . Waiting for clinical questions to populate.

## 2024-05-31 NOTE — Telephone Encounter (Signed)
 Pharmacy Patient Advocate Encounter  Received notification from [employer] that Prior Authorization for busPIRone  HCl 10MG  tablets has been APPROVED from 07/08/23 to 07/06/25. Ran test claim, Copay is $14.56. This test claim was processed through [facility]- copay amounts may vary at other pharmacies due to pharmacy/plan contracts, or as the patient moves through the different stages of their insurance plan.   PA #/Case ID/Reference #: [id number]

## 2024-05-31 NOTE — Telephone Encounter (Signed)
 Requested medication (s) are due for refill today: na   Requested medication (s) are on the active medication list: yes   Last refill:  03/14/24 #270 0 refills  Future visit scheduled:  yes in 3 weeks   Notes to clinic:  not delegated per protocol do you want to refill Rx?     Requested Prescriptions  Pending Prescriptions Disp Refills   metaxalone  (SKELAXIN ) 800 MG tablet [Pharmacy Med Name: METAXALONE  800 MG Oral Tablet] 270 tablet 3    Sig: TAKE 1 TABLET THREE TIMES DAILY AS NEEDED FOR MUSCLE SPASM(S)     Not Delegated - Analgesics:  Muscle Relaxants Failed - 05/31/2024  3:55 PM      Failed - This refill cannot be delegated      Passed - Valid encounter within last 6 months    Recent Outpatient Visits           5 months ago Annual physical exam   [facility] at [facility], Leita DEL, MD       Future Appointments             In 3 weeks Justus, Leita DEL, MD [facility] at [facility], [phone] [place]

## 2024-06-21 ENCOUNTER — Other Ambulatory Visit: Payer: Self-pay | Admitting: Internal Medicine

## 2024-06-21 ENCOUNTER — Encounter: Payer: Self-pay | Admitting: Internal Medicine

## 2024-06-21 DIAGNOSIS — M797 Fibromyalgia: Secondary | ICD-10-CM

## 2024-06-21 MED ORDER — HYDROCODONE-ACETAMINOPHEN 5-325 MG PO TABS: 1.0000 | ORAL_TABLET | Freq: Three times a day (TID) | ORAL | 0 refills | Status: AC | PRN

## 2024-06-21 NOTE — Telephone Encounter (Signed)
 Please review.  KP

## 2024-06-21 NOTE — Progress Notes (Unsigned)
 Date:  06/21/2024   Name:  Sandra Mckinney   DOB:  12/24/1945   MRN:  [id number]   Chief Complaint: No chief complaint on file.  HPI  Review of Systems   Lab Results  Component Value Date   NA 143 12/25/2023   K 4.3 12/25/2023   CO2 31 (A) 12/25/2023   GLUCOSE 88 12/06/2014   BUN 13 12/25/2023   CREATININE 0.9 12/25/2023   CALCIUM 9.8 12/25/2023   EGFR 61.0 12/30/2023   GFRNONAA 56 04/19/2020   Lab Results  Component Value Date   CHOL 198 12/25/2023   HDL 86 (A) 12/25/2023   LDLCALC 82 12/25/2023   TRIG 198 (A) 12/25/2023   CHOLHDL 2.3 04/16/2016   Lab Results  Component Value Date   TSH 0.36 (A) 12/30/2023   Lab Results  Component Value Date   HGBA1C 5.6 09/18/2021   Lab Results  Component Value Date   WBC 6.8 12/23/2022   HGB 13.0 12/23/2022   HCT 39 12/23/2022   MCV 88 07/29/2012   PLT 202 12/23/2022   Lab Results  Component Value Date   ALT 11 12/25/2023   AST 20 12/25/2023   ALKPHOS 81 12/25/2023   Lab Results  Component Value Date   VD25OH 51 09/18/2021     Patient Active Problem List   Diagnosis Date Noted   Primary osteoarthritis of both hands 07/26/2021   Chronic rhinitis 07/26/2021   Mild cognitive impairment 02/08/2020   Narcotic use agreement exists 12/14/2019   Acquired absence of both breasts 06/22/2019   Fibromyalgia 07/29/2016   Mixed hyperlipidemia 05/20/2016   Age-related osteoporosis without current pathological fracture 04/16/2016   Anemia 12/12/2015   Neoplasm of left breast, primary tumor staging category Tis: ductal carcinoma in situ (DCIS) 01/03/2015   History of left breast cancer 01/03/2015   Neoplasm of right breast, primary tumor staging category Tis: ductal carcinoma in situ (DCIS) 12/27/2014   Anxiety disorder 10/11/2014   Plantar fasciitis 10/11/2014   Local edema 10/11/2014   Bipolar 2 disorder, major depressive episode (HCC) 10/11/2014   Hot flash, menopausal 10/11/2014   Migraine without aura  and responsive to treatment 10/11/2014   Idiopathic insomnia 10/11/2014   Psoriasis 10/11/2014   Restless leg 10/11/2014   Avitaminosis D 10/11/2014    Allergies[1]  Past Surgical History:  Procedure Laterality Date   ABDOMINAL HYSTERECTOMY  1986   uterine fibroids and endometriosis   BREAST BIOPSY Right 06/01/12   us  bx/clip-neg   BREAST IMPLANT REMOVAL Bilateral 10/19/2019   BREAST RECONSTRUCTION Bilateral 2017   BREAST REDUCTION SURGERY  10/19/2019   all breast reconstructions removed   CARPAL TUNNEL RELEASE Right 2014   EYE SURGERY Bilateral 2009   catarcts   MASTECTOMY, RADICAL Bilateral 2016   REDUCTION MAMMAPLASTY     2001   THUMB ARTHROSCOPY Right 2014    Social History[2]   Medication list has been reviewed and updated.  Active Medications[3]     12/25/2023    9:56 AM 12/23/2022   10:14 AM 06/19/2022    9:15 AM 12/18/2021   10:13 AM  GAD 7 : Generalized Anxiety Score  Nervous, Anxious, on Edge 0 1 0 0  Control/stop worrying 0 0 0 0  Worry too much - different things 0 0 0 0  Trouble relaxing 0 0 0 0  Restless 0 0 0 0  Easily annoyed or irritable 0 1 0 1  Afraid - awful might happen 0 0 0 0  Total GAD 7 Score 0 2 0 1  Anxiety Difficulty Not difficult at all Not difficult at all Not difficult at all Not difficult at all       12/25/2023    9:56 AM 07/30/2023    9:27 AM 12/23/2022   10:14 AM  Depression screen PHQ 2/9  Decreased Interest 0 0 0  Down, Depressed, Hopeless 0 1 0  PHQ - 2 Score 0 1 0  Altered sleeping 2  0  Tired, decreased energy 0  0  Change in appetite 0  0  Feeling bad or failure about yourself  0  0  Trouble concentrating 0  0  Moving slowly or fidgety/restless 0  0  Suicidal thoughts 0    PHQ-9 Score 2   0   Difficult doing work/chores Not difficult at all  Not difficult at all     Data saved with a previous flowsheet row definition    BP Readings from Last 3 Encounters:  12/25/23 122/78  12/23/22 126/82  06/19/22 110/78     Physical Exam  Wt Readings from Last 3 Encounters:  12/25/23 169 lb 9.6 oz (76.9 kg)  07/30/23 168 lb (76.2 kg)  12/23/22 177 lb (80.3 kg)    There were no vitals taken for this visit.  Assessment and Plan:  Problem List Items Addressed This Visit       Unprioritized   Fibromyalgia (Chronic)    No follow-ups on file.    Leita HILARIO Adie, MD [facility] [place]           [1]  Allergies Allergen Reactions   Cephalexin Hives   Letrozole Swelling   Raloxifene Hives   Selective Estrogen Receptor Modulators Hives   Citalopram Other (See Comments)    Dry mouth    Cephalosporins    Duloxetine Hcl    Exemestane Other (See Comments)   Lyrica [Pregabalin]    Oxycodone    Penicillins    Prilosec  [Omeprazole]    Sulfa Antibiotics   [2]  Social History Tobacco Use   Smoking status: Former    Current packs/day: 0.00    Average packs/day: 0.8 packs/day for 10.0 years (7.5 ttl pk-yrs)    Types: Cigarettes    Start date: 04/13/1964    Quit date: 04/13/1974    Years since quitting: 50.2   Smokeless tobacco: Never   Tobacco comments:    smoking cessation materials not required  Vaping Use   Vaping status: Never Used  Substance Use Topics   Alcohol use: No    Alcohol/week: 0.0 standard drinks of alcohol   Drug use: No  [3]  No outpatient medications have been marked as taking for the 06/21/24 encounter (Orders Only) with Adie Leita DEL, MD.

## 2024-06-24 ENCOUNTER — Ambulatory Visit: Admitting: Internal Medicine

## 2024-06-24 ENCOUNTER — Encounter: Payer: Self-pay | Admitting: Internal Medicine

## 2024-06-24 VITALS — BP 122/78 | HR 58 | Ht 62.0 in | Wt 167.0 lb

## 2024-06-24 DIAGNOSIS — G43009 Migraine without aura, not intractable, without status migrainosus: Secondary | ICD-10-CM

## 2024-06-24 DIAGNOSIS — R600 Localized edema: Secondary | ICD-10-CM

## 2024-06-24 DIAGNOSIS — G2581 Restless legs syndrome: Secondary | ICD-10-CM

## 2024-06-24 DIAGNOSIS — M797 Fibromyalgia: Secondary | ICD-10-CM

## 2024-06-24 DIAGNOSIS — F3181 Bipolar II disorder: Secondary | ICD-10-CM

## 2024-06-24 MED ORDER — HYDROCODONE-ACETAMINOPHEN 5-325 MG PO TABS: 1.0000 | ORAL_TABLET | Freq: Three times a day (TID) | ORAL | 0 refills | Status: AC | PRN

## 2024-06-24 NOTE — Assessment & Plan Note (Signed)
 Medications being prescribed by Geriatrics. Higher dose Trazodone has helped her sleep to a slight degree. She continues on lamictal and Buspar .

## 2024-06-24 NOTE — Progress Notes (Signed)
 "   Date:  06/24/2024   Name:  Sandra Mckinney   DOB:  11-26-1945   MRN:  [id number]   Chief Complaint: Fibromyalgia  Depression        This is a chronic (bipolar - previously under psych care -  now being prescribed by Geriatric medicine) problem.  Associated symptoms include no fatigue, no myalgias and no headaches.  Treatments tried: on Lamictal and Bupsar with Trazodone for sleep. Migraine  This is a recurrent problem. The pain quality is similar to prior headaches. Pertinent negatives include no abdominal pain, coughing, dizziness or weakness.  Fibromyalgia - long standing chronic moderate pain and fatigue.  Has been on hydrocodone  for years 2-3 per day.  She stopped gabapentin  due to memory issues which almost completely resolved.  Today has more pain in the mid back. RLS - on Requip  with good response.  Review of Systems  Constitutional:  Negative for fatigue and unexpected weight change.  HENT:  Negative for trouble swallowing.   Eyes:  Negative for visual disturbance.  Respiratory:  Negative for cough, chest tightness, shortness of breath and wheezing.   Cardiovascular:  Negative for chest pain, palpitations and leg swelling.  Gastrointestinal:  Negative for abdominal pain, constipation and diarrhea.  Musculoskeletal:  Negative for arthralgias and myalgias.  Neurological:  Negative for dizziness, weakness, light-headedness and headaches.  Psychiatric/Behavioral:  Positive for depression.      Lab Results  Component Value Date   NA 143 12/25/2023   K 4.3 12/25/2023   CO2 31 (A) 12/25/2023   GLUCOSE 88 12/06/2014   BUN 13 12/25/2023   CREATININE 0.9 12/25/2023   CALCIUM 9.8 12/25/2023   EGFR 61.0 12/30/2023   GFRNONAA 56 04/19/2020   Lab Results  Component Value Date   CHOL 198 12/25/2023   HDL 86 (A) 12/25/2023   LDLCALC 82 12/25/2023   TRIG 198 (A) 12/25/2023   CHOLHDL 2.3 04/16/2016   Lab Results  Component Value Date   TSH 0.36 (A) 12/30/2023    Lab Results  Component Value Date   HGBA1C 5.6 09/18/2021   Lab Results  Component Value Date   WBC 6.8 12/23/2022   HGB 13.0 12/23/2022   HCT 39 12/23/2022   MCV 88 07/29/2012   PLT 202 12/23/2022   Lab Results  Component Value Date   ALT 11 12/25/2023   AST 20 12/25/2023   ALKPHOS 81 12/25/2023   Lab Results  Component Value Date   VD25OH 51 09/18/2021     Patient Active Problem List   Diagnosis Date Noted   Primary osteoarthritis of both hands 07/26/2021   Chronic rhinitis 07/26/2021   Mild cognitive impairment 02/08/2020   Narcotic use agreement exists 12/14/2019   Acquired absence of both breasts 06/22/2019   Fibromyalgia 07/29/2016   Mixed hyperlipidemia 05/20/2016   Age-related osteoporosis without current pathological fracture 04/16/2016   Anemia 12/12/2015   Neoplasm of left breast, primary tumor staging category Tis: ductal carcinoma in situ (DCIS) 01/03/2015   History of left breast cancer 01/03/2015   Neoplasm of right breast, primary tumor staging category Tis: ductal carcinoma in situ (DCIS) 12/27/2014   Anxiety disorder 10/11/2014   Plantar fasciitis 10/11/2014   Local edema 10/11/2014   Bipolar 2 disorder, major depressive episode (HCC) 10/11/2014   Hot flash, menopausal 10/11/2014   Migraine without aura and responsive to treatment 10/11/2014   Idiopathic insomnia 10/11/2014   Psoriasis 10/11/2014   Restless leg 10/11/2014   Avitaminosis D 10/11/2014  Allergies[1]  Past Surgical History:  Procedure Laterality Date   ABDOMINAL HYSTERECTOMY  1986   uterine fibroids and endometriosis   BREAST BIOPSY Right 06/01/12   us  bx/clip-neg   BREAST IMPLANT REMOVAL Bilateral 10/19/2019   BREAST RECONSTRUCTION Bilateral 2017   BREAST REDUCTION SURGERY  10/19/2019   all breast reconstructions removed   CARPAL TUNNEL RELEASE Right 2014   EYE SURGERY Bilateral 2009   catarcts   MASTECTOMY, RADICAL Bilateral 2016   REDUCTION MAMMAPLASTY     2001    THUMB ARTHROSCOPY Right 2014    Social History[2]   Medication list has been reviewed and updated.  Active Medications[3]     12/25/2023    9:56 AM 12/23/2022   10:14 AM 06/19/2022    9:15 AM 12/18/2021   10:13 AM  GAD 7 : Generalized Anxiety Score  Nervous, Anxious, on Edge 0 1 0 0  Control/stop worrying 0 0 0 0  Worry too much - different things 0 0 0 0  Trouble relaxing 0 0 0 0  Restless 0 0 0 0  Easily annoyed or irritable 0 1 0 1  Afraid - awful might happen 0 0 0 0  Total GAD 7 Score 0 2 0 1  Anxiety Difficulty Not difficult at all Not difficult at all Not difficult at all Not difficult at all       06/24/2024   11:25 AM 12/25/2023    9:56 AM 07/30/2023    9:27 AM  Depression screen PHQ 2/9  Decreased Interest 0 0 0  Down, Depressed, Hopeless 0 0 1  PHQ - 2 Score 0 0 1  Altered sleeping 2 2   Tired, decreased energy 0 0   Change in appetite 0 0   Feeling bad or failure about yourself  0 0   Trouble concentrating 0 0   Moving slowly or fidgety/restless 0 0   Suicidal thoughts 0 0   PHQ-9 Score 2 2    Difficult doing work/chores Not difficult at all Not difficult at all      Data saved with a previous flowsheet row definition    BP Readings from Last 3 Encounters:  06/24/24 122/78  12/25/23 122/78  12/23/22 126/82    Physical Exam Vitals and nursing note reviewed.  Constitutional:      General: She is not in acute distress.    Appearance: She is well-developed.  HENT:     Head: Normocephalic and atraumatic.  Cardiovascular:     Rate and Rhythm: Normal rate and regular rhythm.     Heart sounds: No murmur heard. Pulmonary:     Effort: Pulmonary effort is normal. No respiratory distress.     Breath sounds: No wheezing or rhonchi.  Musculoskeletal:     Cervical back: Normal range of motion.     Right lower leg: Edema present.     Left lower leg: Edema present.  Lymphadenopathy:     Cervical: No cervical adenopathy.  Skin:    General: Skin is  warm and dry.     Findings: No rash.  Neurological:     General: No focal deficit present.     Mental Status: She is alert and oriented to person, place, and time.  Psychiatric:        Mood and Affect: Mood normal.        Behavior: Behavior normal.     Wt Readings from Last 3 Encounters:  06/24/24 167 lb (75.8 kg)  12/25/23 169 lb 9.6  oz (76.9 kg)  07/30/23 168 lb (76.2 kg)    BP 122/78   Pulse (!) 58   Ht 5' 2 (1.575 m)   Wt 167 lb (75.8 kg)   SpO2 97%   BMI 30.54 kg/m   Assessment and Plan:  Problem List Items Addressed This Visit       Unprioritized   Local edema (Chronic)   Mild symptoms controlled with furosemide .      Bipolar 2 disorder, major depressive episode (HCC)   Medications being prescribed by Geriatrics. Higher dose Trazodone has helped her sleep to a slight degree. She continues on lamictal and Buspar .      Migraine without aura and responsive to treatment (Chronic)   On propranolol  daily for prevention. Takes Excedrin Migraine as needed. Migraines have not changed in character or frequency.      Relevant Medications   traZODone (DESYREL) 100 MG tablet   HYDROcodone -acetaminophen  (NORCO/VICODIN) 5-325 MG tablet (Start on 07/21/2024)   Restless leg (Chronic)   Symptoms well controlled on nightly Requip       Fibromyalgia - Primary (Chronic)   Having a more pain today in the mid back despite taking hydrocodone  this AM. She is no longer taking gabapentin  - if symptoms continue to worsen, can consider trial of Lyrica. Will continue the same dose of hydrocodone  (3 per day for many years).      Relevant Medications   traZODone (DESYREL) 100 MG tablet   HYDROcodone -acetaminophen  (NORCO/VICODIN) 5-325 MG tablet (Start on 07/21/2024)    Return in about 2 months (around 08/25/2024) for fibromyalgia.    Leita HILARIO Adie, MD [facility] [place]           [1]  Allergies Allergen Reactions   Cephalexin  Hives   Letrozole Swelling   Raloxifene Hives   Selective Estrogen Receptor Modulators Hives   Citalopram Other (See Comments)    Dry mouth    Cephalosporins    Duloxetine Hcl    Exemestane Other (See Comments)   Lyrica [Pregabalin]    Oxycodone    Penicillins    Prilosec  [Omeprazole]    Sulfa Antibiotics   [2]  Social History Tobacco Use   Smoking status: Former    Current packs/day: 0.00    Average packs/day: 0.8 packs/day for 10.0 years (7.5 ttl pk-yrs)    Types: Cigarettes    Start date: 04/13/1964    Quit date: 04/13/1974    Years since quitting: 50.2   Smokeless tobacco: Never   Tobacco comments:    smoking cessation materials not required  Vaping Use   Vaping status: Never Used  Substance Use Topics   Alcohol use: No    Alcohol/week: 0.0 standard drinks of alcohol   Drug use: No  [3]  Current Meds  Medication Sig   aspirin-acetaminophen -caffeine (EXCEDRIN MIGRAINE) 250-250-65 MG tablet Take by mouth every 6 (six) hours as needed for headache.   busPIRone  (BUSPAR ) 10 MG tablet TAKE 1 TABLET FOUR TIMES DAILY   Calcium-Magnesium-Vitamin D  600-40-500 MG-MG-UNIT TB24 Take 1 tablet by mouth daily.   chlorpheniramine (CHLOR-TRIMETON) 4 MG tablet Take 4 mg by mouth daily.   cholecalciferol (VITAMIN D ) 1000 UNITS tablet Take 1 tablet by mouth daily.   ferrous sulfate 325 (65 FE) MG tablet Take 325 mg by mouth daily with breakfast.   furosemide  (LASIX ) 20 MG tablet TAKE 1 TABLET EVERY DAY (NEED MD APPOINTMENT)   glucosamine-chondroitin 500-400 MG tablet Take 1 tablet by mouth 2 (two)  times daily.   lamoTRIgine (LAMICTAL) 25 MG tablet Take 50 mg by mouth in the morning and at bedtime.   metaxalone  (SKELAXIN ) 800 MG tablet TAKE 1 TABLET THREE TIMES DAILY AS NEEDED FOR MUSCLE SPASM(S)   MULTIPLE VITAMINS-MINERALS PO Take 1 tablet by mouth daily.   propranolol  ER (INDERAL  LA) 60 MG 24 hr capsule TAKE 1 CAPSULE EVERY DAY   rOPINIRole  (REQUIP ) 0.5 MG tablet TAKE 7 TABLETS AT  BEDTIME   traZODone (DESYREL) 100 MG tablet Take 100 mg by mouth at bedtime.   [DISCONTINUED] HYDROcodone -acetaminophen  (NORCO/VICODIN) 5-325 MG tablet Take 1 tablet by mouth every 8 (eight) hours as needed for moderate pain (pain score 4-6). Fibromyalgia.   "

## 2024-06-24 NOTE — Assessment & Plan Note (Signed)
 Mild symptoms controlled with furosemide .

## 2024-06-24 NOTE — Assessment & Plan Note (Signed)
 Having a more pain today in the mid back despite taking hydrocodone  this AM. She is no longer taking gabapentin  - if symptoms continue to worsen, can consider trial of Lyrica. Will continue the same dose of hydrocodone  (3 per day for many years).

## 2024-06-24 NOTE — Assessment & Plan Note (Addendum)
 On propranolol  daily for prevention. Takes Excedrin Migraine as needed. Migraines have not changed in character or frequency.

## 2024-06-24 NOTE — Assessment & Plan Note (Signed)
 Symptoms well controlled on nightly Requip 

## 2024-07-27 ENCOUNTER — Encounter: Payer: Self-pay | Admitting: Family Medicine

## 2024-07-27 DIAGNOSIS — G2581 Restless legs syndrome: Secondary | ICD-10-CM

## 2024-07-27 NOTE — Telephone Encounter (Signed)
 Please review patient's message:

## 2024-07-28 ENCOUNTER — Other Ambulatory Visit: Payer: Self-pay | Admitting: Family Medicine

## 2024-07-28 NOTE — Telephone Encounter (Signed)
 Can you please pend orders to sign. Ty

## 2024-07-29 MED ORDER — PROPRANOLOL HCL ER 60 MG PO CP24: 60.0000 mg | ORAL_CAPSULE | Freq: Every day | ORAL | 1 refills | Status: AC

## 2024-07-29 MED ORDER — ROPINIROLE HCL 0.5 MG PO TABS: ORAL_TABLET | ORAL | 1 refills | Status: AC

## 2024-07-29 MED ORDER — FUROSEMIDE 20 MG PO TABS: ORAL_TABLET | ORAL | 1 refills | Status: AC

## 2024-08-11 ENCOUNTER — Ambulatory Visit: Payer: Self-pay

## 2024-08-11 VITALS — Ht 62.0 in | Wt 167.0 lb

## 2024-08-11 DIAGNOSIS — Z0000 Encounter for general adult medical examination without abnormal findings: Secondary | ICD-10-CM

## 2024-08-11 NOTE — Progress Notes (Signed)
 "  Chief Complaint  Patient presents with   Medicare Wellness     Subjective:   Sandra Mckinney is a 79 y.o. female who presents for a Medicare Annual Wellness Visit.  Visit info / Clinical Intake: Medicare Wellness Visit Type:: Subsequent Annual Wellness Visit Persons participating in visit and providing information:: patient Medicare Wellness Visit Mode:: Video Since this visit was completed virtually, some vitals may be partially provided or unavailable. Missing vitals are due to the limitations of the virtual format.: Documented vitals are patient reported If Telephone or Video please confirm:: I connected with patient using audio/video enable telemedicine. I verified patient identity with two identifiers, discussed telehealth limitations, and patient agreed to proceed. Patient Location:: Home Provider Location:: Office Interpreter Needed?: No Pre-visit prep was completed: yes AWV questionnaire completed by patient prior to visit?: yes Date:: 08/07/24 Living arrangements:: lives with spouse/significant other Patient's Overall Health Status Rating: very good Typical amount of pain: (!) a lot Does pain affect daily life?: (!) yes (Followed by medical attention) Are you currently prescribed opioids?: (!) yes  Dietary Habits and Nutritional Risks How many meals a day?: 3 Eats fruit and vegetables daily?: (!) no Most meals are obtained by: preparing own meals In the last 2 weeks, have you had any of the following?: none Diabetic:: no  Functional Status Activities of Daily Living (to include ambulation/medication): Independent Ambulation: Independent with device- listed below Home Assistive Devices/Equipment: Eyeglasses Medication Administration: Independent Home Management (perform basic housework or laundry): Independent Manage your own finances?: yes Primary transportation is: driving Concerns about vision?: no *vision screening is required for WTM* Concerns  about hearing?: no  Fall Screening Falls in the past year?: 0 Number of falls in past year: 0 Was there an injury with Fall?: 0 Fall Risk Category Calculator: 0 Patient Fall Risk Level: Low Fall Risk  Fall Risk Patient at Risk for Falls Due to: No Fall Risks Fall risk Follow up: Falls evaluation completed  Home and Transportation Safety: All rugs have non-skid backing?: N/A, no rugs All stairs or steps have railings?: yes Grab bars in the bathtub or shower?: (!) no Have non-skid surface in bathtub or shower?: yes Good home lighting?: yes Regular seat belt use?: yes Hospital stays in the last year:: no  Cognitive Assessment Difficulty concentrating, remembering, or making decisions? : yes Will 6CIT or Mini Cog be Completed: yes What year is it?: 0 points What month is it?: 0 points Give patient an address phrase to remember (5 components): [address] [place]  About what time is it?: 0 points Count backwards from 20 to 1: 0 points Say the months of the year in reverse: 0 points Repeat the address phrase from earlier: 0 points 6 CIT Score: 0 points  Advance Directives (For Healthcare) Does Patient Have a Medical Advance Directive?: Yes Does patient want to make changes to medical advance directive?: No - Patient declined Type of Advance Directive: Healthcare Power of [place]; Living will Copy of Healthcare Power of Attorney in Chart?: No - copy requested Copy of Living Will in Chart?: No - copy requested  Reviewed/Updated  Reviewed/Updated: Reviewed All (Medical, Surgical, Family, Medications, Allergies, Care Teams, Patient Goals)    Allergies (verified) Cephalexin, Letrozole, Raloxifene, Selective estrogen receptor modulators, Citalopram, Cephalosporins, Duloxetine hcl, Exemestane, Lyrica [pregabalin], Oxycodone, Penicillins, Prilosec  [omeprazole], and Sulfa antibiotics   Current Medications (verified) Outpatient Encounter Medications as of 08/11/2024   Medication Sig   aspirin-acetaminophen -caffeine (EXCEDRIN MIGRAINE) 250-250-65 MG tablet Take by mouth  every 6 (six) hours as needed for headache.   busPIRone  (BUSPAR ) 10 MG tablet TAKE 1 TABLET FOUR TIMES DAILY   Calcium-Magnesium-Vitamin D  600-40-500 MG-MG-UNIT TB24 Take 1 tablet by mouth daily.   chlorpheniramine (CHLOR-TRIMETON) 4 MG tablet Take 4 mg by mouth daily.   cholecalciferol (VITAMIN D ) 1000 UNITS tablet Take 1 tablet by mouth daily.   ferrous sulfate 325 (65 FE) MG tablet Take 325 mg by mouth daily with breakfast.   furosemide  (LASIX ) 20 MG tablet TAKE 1 TABLET EVERY DAY (NEED MD APPOINTMENT)   glucosamine-chondroitin 500-400 MG tablet Take 1 tablet by mouth 2 (two) times daily.   HYDROcodone -acetaminophen  (NORCO/VICODIN) 5-325 MG tablet Take 1 tablet by mouth every 8 (eight) hours as needed for moderate pain (pain score 4-6). Fibromyalgia.   lamoTRIgine (LAMICTAL) 25 MG tablet Take 50 mg by mouth in the morning and at bedtime.   metaxalone  (SKELAXIN ) 800 MG tablet TAKE 1 TABLET THREE TIMES DAILY AS NEEDED FOR MUSCLE SPASM(S)   MULTIPLE VITAMINS-MINERALS PO Take 1 tablet by mouth daily.   propranolol  ER (INDERAL  LA) 60 MG 24 hr capsule Take 1 capsule (60 mg total) by mouth daily.   rOPINIRole  (REQUIP ) 0.5 MG tablet TAKE 7 TABLETS AT BEDTIME   traZODone (DESYREL) 100 MG tablet Take 100 mg by mouth at bedtime.   No facility-administered encounter medications on file as of 08/11/2024.    History: Past Medical History:  Diagnosis Date   Breast cancer (HCC) 2016   Complication of anesthesia    2007.  Med injected into vein instead of muscle.  Heart stopped.  3 shocks to restart.  3 grand mal seizures after heart restarted.   Fibromyalgia    Hyperlipidemia    Major depressive disorder    Migraine headache    only 3-4 per year since starting propranolol    Restless leg syndrome    Seizures (HCC) 2007   Med was injected into vein instead of muscle.  Heart stopped.  3 shocks to  restart heart.  3 grand mal seizures after heart restarted.   Vitamin D  deficiency    Past Surgical History:  Procedure Laterality Date   ABDOMINAL HYSTERECTOMY  1986   uterine fibroids and endometriosis   BREAST BIOPSY Right 06/01/12   us  bx/clip-neg   BREAST IMPLANT REMOVAL Bilateral 10/19/2019   BREAST RECONSTRUCTION Bilateral 2017   BREAST REDUCTION SURGERY  10/19/2019   all breast reconstructions removed   CARPAL TUNNEL RELEASE Right 2014   EYE SURGERY Bilateral 2009   catarcts   MASTECTOMY, RADICAL Bilateral 2016   REDUCTION MAMMAPLASTY     2001   THUMB ARTHROSCOPY Right 2014   Family History  Problem Relation Age of Onset   Dementia Mother    Hypertension Mother    Heart disease Father 30   Social History   Occupational History   Occupation: Retired  Tobacco Use   Smoking status: Former    Current packs/day: 0.00    Average packs/day: 0.8 packs/day for 10.0 years (7.5 ttl pk-yrs)    Types: Cigarettes    Start date: 04/13/1964    Quit date: 04/13/1974    Years since quitting: 50.3   Smokeless tobacco: Never   Tobacco comments:    smoking cessation materials not required  Vaping Use   Vaping status: Never Used  Substance and Sexual Activity   Alcohol use: No    Alcohol/week: 0.0 standard drinks of alcohol   Drug use: No   Sexual activity: Not Currently  Tobacco Counseling Counseling given: No Tobacco comments: smoking cessation materials not required  SDOH Screenings   Food Insecurity: No Food Insecurity (08/11/2024)  Housing: Low Risk (08/11/2024)  Transportation Needs: No Transportation Needs (08/11/2024)  Utilities: Not At Risk (08/11/2024)  Alcohol Screen: Low Risk (07/30/2023)  Depression (PHQ2-9): Low Risk (08/11/2024)  Financial Resource Strain: Low Risk (06/21/2024)  Physical Activity: Inactive (08/11/2024)  Social Connections: Moderately Isolated (08/11/2024)  Stress: No Stress Concern Present (08/11/2024)  Tobacco Use: Medium Risk (08/11/2024)  Health  Literacy: Adequate Health Literacy (08/11/2024)   See flowsheets for full screening details  Depression Screen PHQ 2 & 9 Depression Scale- Over the past 2 weeks, how often have you been bothered by any of the following problems? Little interest or pleasure in doing things: 0 Feeling down, depressed, or hopeless (PHQ Adolescent also includes...irritable): 0 PHQ-2 Total Score: 0 Trouble falling or staying asleep, or sleeping too much: 0 Feeling tired or having little energy: 0 Poor appetite or overeating (PHQ Adolescent also includes...weight loss): 0 Feeling bad about yourself - or that you are a failure or have let yourself or your family down: 0 Trouble concentrating on things, such as reading the newspaper or watching television (PHQ Adolescent also includes...like school work): 0 Moving or speaking so slowly that other people could have noticed. Or the opposite - being so fidgety or restless that you have been moving around a lot more than usual: 0 Thoughts that you would be better off dead, or of hurting yourself in some way: 0 PHQ-9 Total Score: 0 If you checked off any problems, how difficult have these problems made it for you to do your work, take care of things at home, or get along with other people?: Not difficult at all  Depression Treatment Depression Interventions/Treatment : Counseling     Goals Addressed               This Visit's Progress     Increase physical activity (pt-stated)        Remain active!             Objective:    Today's Vitals   08/11/24 0831  Weight: 167 lb (75.8 kg)  Height: 5' 2 (1.575 m)   Body mass index is 30.54 kg/m.  Hearing/Vision screen Hearing Screening - Comments:: Denies hearing difficulties   Vision Screening - Comments:: Wears rx glasses -Not up to date with routine eye exams.   Immunizations and Health Maintenance Health Maintenance  Topic Date Due   COVID-19 Vaccine (8 - 2025-26 season) 03/07/2024   Medicare  Annual Wellness (AWV)  08/11/2025   DTaP/Tdap/Td (2 - Td or Tdap) 03/01/2034   Pneumococcal Vaccine: 50+ Years  Completed   Influenza Vaccine  Completed   Bone Density Scan  Completed   Zoster Vaccines- Shingrix  Completed   Hepatitis C Screening  Addressed   Meningococcal B Vaccine  Aged Out   Mammogram  Discontinued   Colonoscopy  Discontinued   Fecal DNA (Cologuard)  Discontinued        Assessment/Plan:  This is a routine wellness examination for [facility].  Patient Care Team: Justus Leita DEL, MD as PCP - General (Family Medicine) Lane Arthea BRAVO, MD as Referring Physician (Neurology) Long, Annabella Dess, MD as Referring Physician (Geriatric Medicine)  I have personally reviewed and noted the following in the patients chart:   Medical and social history Use of alcohol, tobacco or illicit drugs  Current medications and supplements including opioid prescriptions. Functional ability  and status Nutritional status Physical activity Advanced directives List of other physicians Hospitalizations, surgeries, and ER visits in previous 12 months Vitals Screenings to include cognitive, depression, and falls Referrals and appointments  No orders of the defined types were placed in this encounter.  In addition, I have reviewed and discussed with patient certain preventive protocols, quality metrics, and best practice recommendations. A written personalized care plan for preventive services as well as general preventive health recommendations were provided to patient.   Rojelio LELON Blush, LPN   01/08/7972   Return in 53 weeks (on 08/17/2025).  After Visit Summary: (MyChart) Due to this being a telephonic visit, the after visit summary with patients personalized plan was offered to patient via MyChart   Nurse Notes: No voiced or noted concerns at this time "

## 2024-08-11 NOTE — Patient Instructions (Addendum)
 Ms. Felicetti,  Thank you for taking the time for your Medicare Wellness Visit. I appreciate your continued commitment to your health goals. Please review the care plan we discussed, and feel free to reach out if I can assist you further.  Please note that Annual Wellness Visits do not include a physical exam. Some assessments may be limited, especially if the visit was conducted virtually. If needed, we may recommend an in-person follow-up with your provider.  Ongoing Care Seeing your primary care provider every 3 to 6 months helps us  monitor your health and provide consistent, personalized care.    Referrals If a referral was made during today's visit and you haven't received any updates within two weeks, please contact the referred provider directly to check on the status.  Recommended Screenings:  Health Maintenance  Topic Date Due   COVID-19 Vaccine (8 - 2025-26 season) 03/07/2024   Medicare Annual Wellness Visit  08/11/2025   DTaP/Tdap/Td vaccine (2 - Td or Tdap) 03/01/2034   Pneumococcal Vaccine for age over 56  Completed   Flu Shot  Completed   Osteoporosis screening with Bone Density Scan  Completed   Zoster (Shingles) Vaccine  Completed   Hepatitis C Screening  Addressed   Meningitis B Vaccine  Aged Out   Breast Cancer Screening  Discontinued   Colon Cancer Screening  Discontinued   Cologuard (Stool DNA test)  Discontinued       08/11/2024    8:40 AM  Advanced Directives  Does Patient Have a Medical Advance Directive? Yes  Type of Estate Agent of [place];Living will  Does patient want to make changes to medical advance directive? No - Patient declined  Copy of Healthcare Power of Attorney in Chart? No - copy requested    Vision: Annual vision screenings are recommended for early detection of glaucoma, cataracts, and diabetic retinopathy. These exams can also reveal signs of chronic conditions such as diabetes and high blood  pressure.  Dental: Annual dental screenings help detect early signs of oral cancer, gum disease, and other conditions linked to overall health, including heart disease and diabetes.  Please see the attached documents for additional preventive care recommendations.

## 2024-08-18 ENCOUNTER — Encounter: Admitting: Family Medicine
